# Patient Record
Sex: Female | Born: 1975 | Race: White | Hispanic: Yes | Marital: Single | Smoking: Former smoker
Health system: Southern US, Community
[De-identification: ages and names within clinical notes are randomized; demographics above are authoritative.]

## PROBLEM LIST (undated history)

## (undated) ENCOUNTER — Inpatient Hospital Stay (HOSPITAL_COMMUNITY): Payer: Self-pay

## (undated) DIAGNOSIS — F32A Depression, unspecified: Secondary | ICD-10-CM

## (undated) DIAGNOSIS — R109 Unspecified abdominal pain: Secondary | ICD-10-CM

## (undated) DIAGNOSIS — D649 Anemia, unspecified: Secondary | ICD-10-CM

## (undated) DIAGNOSIS — F329 Major depressive disorder, single episode, unspecified: Secondary | ICD-10-CM

## (undated) DIAGNOSIS — R161 Splenomegaly, not elsewhere classified: Secondary | ICD-10-CM

## (undated) HISTORY — PX: OTHER SURGICAL HISTORY: SHX169

## (undated) HISTORY — DX: Morbid (severe) obesity due to excess calories: E66.01

## (undated) HISTORY — PX: TUBAL LIGATION: SHX77

## (undated) HISTORY — DX: Anemia, unspecified: D64.9

## (undated) HISTORY — DX: Splenomegaly, not elsewhere classified: R16.1

## (undated) HISTORY — PX: NO PAST SURGERIES: SHX2092

---

## 2004-09-30 ENCOUNTER — Inpatient Hospital Stay (HOSPITAL_COMMUNITY): Admission: AD | Admit: 2004-09-30 | Discharge: 2004-10-03 | Payer: Self-pay | Admitting: *Deleted

## 2004-10-01 ENCOUNTER — Encounter (INDEPENDENT_AMBULATORY_CARE_PROVIDER_SITE_OTHER): Payer: Self-pay | Admitting: *Deleted

## 2004-10-05 ENCOUNTER — Encounter: Admission: RE | Admit: 2004-10-05 | Discharge: 2004-10-08 | Payer: Self-pay | Admitting: Obstetrics

## 2005-03-23 ENCOUNTER — Emergency Department (HOSPITAL_COMMUNITY): Admission: EM | Admit: 2005-03-23 | Discharge: 2005-03-23 | Payer: Self-pay | Admitting: Emergency Medicine

## 2005-03-29 ENCOUNTER — Emergency Department (HOSPITAL_COMMUNITY): Admission: EM | Admit: 2005-03-29 | Discharge: 2005-03-29 | Payer: Self-pay | Admitting: Emergency Medicine

## 2007-08-16 ENCOUNTER — Emergency Department (HOSPITAL_COMMUNITY): Admission: EM | Admit: 2007-08-16 | Discharge: 2007-08-16 | Payer: Self-pay | Admitting: Emergency Medicine

## 2008-07-11 ENCOUNTER — Emergency Department (HOSPITAL_COMMUNITY): Admission: EM | Admit: 2008-07-11 | Discharge: 2008-07-11 | Payer: Self-pay | Admitting: Emergency Medicine

## 2008-07-22 ENCOUNTER — Emergency Department (HOSPITAL_COMMUNITY): Admission: EM | Admit: 2008-07-22 | Discharge: 2008-07-23 | Payer: Self-pay | Admitting: Emergency Medicine

## 2008-09-10 ENCOUNTER — Inpatient Hospital Stay (HOSPITAL_COMMUNITY): Admission: AD | Admit: 2008-09-10 | Discharge: 2008-09-10 | Payer: Self-pay | Admitting: Obstetrics & Gynecology

## 2009-02-08 ENCOUNTER — Inpatient Hospital Stay (HOSPITAL_COMMUNITY): Admission: AD | Admit: 2009-02-08 | Discharge: 2009-02-08 | Payer: Self-pay | Admitting: Obstetrics

## 2009-02-13 ENCOUNTER — Inpatient Hospital Stay (HOSPITAL_COMMUNITY): Admission: AD | Admit: 2009-02-13 | Discharge: 2009-02-15 | Payer: Self-pay | Admitting: Obstetrics

## 2009-09-01 ENCOUNTER — Emergency Department (HOSPITAL_COMMUNITY): Admission: EM | Admit: 2009-09-01 | Discharge: 2009-09-01 | Payer: Self-pay | Admitting: Emergency Medicine

## 2009-11-12 ENCOUNTER — Ambulatory Visit: Payer: Self-pay | Admitting: Family Medicine

## 2010-03-01 ENCOUNTER — Emergency Department (HOSPITAL_COMMUNITY)
Admission: EM | Admit: 2010-03-01 | Discharge: 2010-03-01 | Disposition: A | Discharge status: Home / Self Care | Payer: Self-pay | Attending: Emergency Medicine | Admitting: Emergency Medicine

## 2010-03-01 DIAGNOSIS — R109 Unspecified abdominal pain: Secondary | ICD-10-CM | POA: Insufficient documentation

## 2010-03-01 DIAGNOSIS — R1033 Periumbilical pain: Secondary | ICD-10-CM | POA: Insufficient documentation

## 2010-03-01 LAB — URINE MICROSCOPIC-ADD ON

## 2010-03-01 LAB — URINALYSIS, ROUTINE W REFLEX MICROSCOPIC
Bilirubin Urine: NEGATIVE
Hgb urine dipstick: NEGATIVE
Ketones, ur: NEGATIVE mg/dL
Nitrite: NEGATIVE
Protein, ur: NEGATIVE mg/dL
Specific Gravity, Urine: 1.012 (ref 1.005–1.030)
Urine Glucose, Fasting: NEGATIVE mg/dL
Urobilinogen, UA: 1 mg/dL (ref 0.0–1.0)
pH: 7 (ref 5.0–8.0)

## 2010-03-01 LAB — CBC
Hemoglobin: 11.6 g/dL — ABNORMAL LOW (ref 12.0–15.0)
MCHC: 34.2 g/dL (ref 30.0–36.0)
RBC: 4.03 MIL/uL (ref 3.87–5.11)
WBC: 12.1 10*3/uL — ABNORMAL HIGH (ref 4.0–10.5)

## 2010-03-01 LAB — DIFFERENTIAL
Basophils Absolute: 0.1 10*3/uL (ref 0.0–0.1)
Basophils Relative: 0 % (ref 0–1)
Lymphocytes Relative: 30 % (ref 12–46)
Lymphs Abs: 3.7 10*3/uL (ref 0.7–4.0)
Monocytes Relative: 8 % (ref 3–12)

## 2010-03-01 LAB — POCT PREGNANCY, URINE: Preg Test, Ur: NEGATIVE

## 2010-03-01 LAB — COMPREHENSIVE METABOLIC PANEL
ALT: 14 U/L (ref 0–35)
AST: 17 U/L (ref 0–37)
Alkaline Phosphatase: 77 U/L (ref 39–117)
CO2: 26 mEq/L (ref 19–32)
Calcium: 8.5 mg/dL (ref 8.4–10.5)
GFR calc Af Amer: >60 mL/min (ref >60)
GFR calc non Af Amer: >60 mL/min (ref >60)
Potassium: 3.9 mEq/L (ref 3.5–5.1)
Sodium: 135 mEq/L (ref 135–145)

## 2010-03-03 ENCOUNTER — Emergency Department (HOSPITAL_COMMUNITY)
Admission: EM | Admit: 2010-03-03 | Discharge: 2010-03-03 | Disposition: A | Discharge status: Home / Self Care | Payer: Self-pay | Attending: Emergency Medicine | Admitting: Emergency Medicine

## 2010-03-03 DIAGNOSIS — F1010 Alcohol abuse, uncomplicated: Secondary | ICD-10-CM | POA: Insufficient documentation

## 2010-03-03 DIAGNOSIS — R1110 Vomiting, unspecified: Secondary | ICD-10-CM | POA: Insufficient documentation

## 2010-03-03 LAB — URINALYSIS, ROUTINE W REFLEX MICROSCOPIC
Urine Glucose, Fasting: NEGATIVE mg/dL
pH: 5.5 (ref 5.0–8.0)

## 2010-03-03 LAB — RAPID URINE DRUG SCREEN, HOSP PERFORMED
Barbiturates: NOT DETECTED
Benzodiazepines: NOT DETECTED
Cocaine: NOT DETECTED

## 2010-03-03 LAB — POCT I-STAT, CHEM 8
Chloride: 107 mEq/L (ref 96–112)
Creatinine, Ser: 1.1 mg/dL (ref 0.4–1.2)
Glucose, Bld: 102 mg/dL — ABNORMAL HIGH (ref 70–99)
Hemoglobin: 12.2 g/dL (ref 12.0–15.0)
Potassium: 3.3 mEq/L — ABNORMAL LOW (ref 3.5–5.1)

## 2010-04-13 LAB — CBC
HCT: 36.4 % (ref 36.0–46.0)
Hemoglobin: 10.4 g/dL — ABNORMAL LOW (ref 12.0–15.0)
MCHC: 34.1 g/dL (ref 30.0–36.0)
MCV: 83.2 fL (ref 78.0–100.0)
Platelets: 199 10*3/uL (ref 150–400)
RDW: 14.3 % (ref 11.5–15.5)
RDW: 14.9 % (ref 11.5–15.5)

## 2010-05-03 LAB — CBC
MCHC: 34.5 g/dL (ref 30.0–36.0)
RBC: 3.57 MIL/uL — ABNORMAL LOW (ref 3.87–5.11)
WBC: 13.7 10*3/uL — ABNORMAL HIGH (ref 4.0–10.5)

## 2010-05-03 LAB — GC/CHLAMYDIA PROBE AMP, GENITAL: Chlamydia, DNA Probe: NEGATIVE

## 2010-05-03 LAB — URINALYSIS, ROUTINE W REFLEX MICROSCOPIC
Glucose, UA: NEGATIVE mg/dL
Nitrite: NEGATIVE
Protein, ur: NEGATIVE mg/dL

## 2010-05-03 LAB — WET PREP, GENITAL: Trich, Wet Prep: NONE SEEN

## 2010-05-03 LAB — URINE MICROSCOPIC-ADD ON

## 2010-05-05 LAB — CBC
Hemoglobin: 11.5 g/dL — ABNORMAL LOW (ref 12.0–15.0)
RBC: 3.94 MIL/uL (ref 3.87–5.11)

## 2010-05-05 LAB — LIPASE, BLOOD: Lipase: 19 U/L (ref 11–59)

## 2010-05-05 LAB — COMPREHENSIVE METABOLIC PANEL
ALT: 20 U/L (ref 0–35)
Alkaline Phosphatase: 72 U/L (ref 39–117)
CO2: 22 mEq/L (ref 19–32)
GFR calc non Af Amer: >60 mL/min (ref >60)
Glucose, Bld: 91 mg/dL (ref 70–99)
Potassium: 3.7 mEq/L (ref 3.5–5.1)
Sodium: 138 mEq/L (ref 135–145)
Total Bilirubin: 0.3 mg/dL (ref 0.3–1.2)

## 2010-05-05 LAB — POCT PREGNANCY, URINE
Preg Test, Ur: POSITIVE
Preg Test, Ur: POSITIVE

## 2010-05-05 LAB — URINALYSIS, ROUTINE W REFLEX MICROSCOPIC
Bilirubin Urine: NEGATIVE
Bilirubin Urine: NEGATIVE
Glucose, UA: NEGATIVE mg/dL
Hgb urine dipstick: NEGATIVE
Ketones, ur: NEGATIVE mg/dL
Nitrite: NEGATIVE
Specific Gravity, Urine: 1.02 (ref 1.005–1.030)
Urobilinogen, UA: 1 mg/dL (ref 0.0–1.0)
pH: 6 (ref 5.0–8.0)

## 2010-05-05 LAB — DIFFERENTIAL
Basophils Relative: 1 % (ref 0–1)
Eosinophils Absolute: 0.1 10*3/uL (ref 0.0–0.7)
Monocytes Relative: 7 % (ref 3–12)
Neutrophils Relative %: 67 % (ref 43–77)

## 2010-05-05 LAB — URINE MICROSCOPIC-ADD ON

## 2010-05-05 LAB — HCG, QUANTITATIVE, PREGNANCY: hCG, Beta Chain, Quant, S: 88223 m[IU]/mL — ABNORMAL HIGH (ref <5)

## 2011-05-01 ENCOUNTER — Emergency Department (HOSPITAL_COMMUNITY)
Admission: EM | Admit: 2011-05-01 | Discharge: 2011-05-01 | Disposition: A | Discharge status: Home / Self Care | Payer: Self-pay | Attending: Emergency Medicine | Admitting: Emergency Medicine

## 2011-05-01 ENCOUNTER — Encounter (HOSPITAL_COMMUNITY): Payer: Self-pay | Admitting: Emergency Medicine

## 2011-05-01 ENCOUNTER — Other Ambulatory Visit: Payer: Self-pay

## 2011-05-01 DIAGNOSIS — R0789 Other chest pain: Secondary | ICD-10-CM | POA: Insufficient documentation

## 2011-05-01 DIAGNOSIS — R10811 Right upper quadrant abdominal tenderness: Secondary | ICD-10-CM | POA: Insufficient documentation

## 2011-05-01 DIAGNOSIS — R110 Nausea: Secondary | ICD-10-CM | POA: Insufficient documentation

## 2011-05-01 DIAGNOSIS — R109 Unspecified abdominal pain: Secondary | ICD-10-CM | POA: Insufficient documentation

## 2011-05-01 DIAGNOSIS — N390 Urinary tract infection, site not specified: Secondary | ICD-10-CM | POA: Insufficient documentation

## 2011-05-01 LAB — CBC
HCT: 35.6 % — ABNORMAL LOW (ref 36.0–46.0)
MCH: 27.6 pg (ref 26.0–34.0)
MCHC: 34 g/dL (ref 30.0–36.0)
MCV: 81.3 fL (ref 78.0–100.0)
RDW: 12.5 % (ref 11.5–15.5)

## 2011-05-01 LAB — DIFFERENTIAL
Basophils Absolute: 0.1 10*3/uL (ref 0.0–0.1)
Basophils Relative: 1 % (ref 0–1)
Eosinophils Absolute: 0.3 10*3/uL (ref 0.0–0.7)
Eosinophils Relative: 2 % (ref 0–5)
Monocytes Absolute: 0.7 10*3/uL (ref 0.1–1.0)

## 2011-05-01 LAB — URINALYSIS, ROUTINE W REFLEX MICROSCOPIC
Bilirubin Urine: NEGATIVE
Ketones, ur: NEGATIVE mg/dL
Nitrite: NEGATIVE
Protein, ur: NEGATIVE mg/dL
Specific Gravity, Urine: 1.012 (ref 1.005–1.030)
Urobilinogen, UA: 0.2 mg/dL (ref 0.0–1.0)

## 2011-05-01 LAB — COMPREHENSIVE METABOLIC PANEL
AST: 20 U/L (ref 0–37)
Albumin: 3.9 g/dL (ref 3.5–5.2)
BUN: 14 mg/dL (ref 6–23)
Calcium: 9 mg/dL (ref 8.4–10.5)
Creatinine, Ser: 0.59 mg/dL (ref 0.50–1.10)

## 2011-05-01 LAB — URINE MICROSCOPIC-ADD ON

## 2011-05-01 LAB — D-DIMER, QUANTITATIVE: D-Dimer, Quant: 0.36 ug/mL-FEU (ref 0.00–0.48)

## 2011-05-01 LAB — LIPASE, BLOOD: Lipase: 24 U/L (ref 11–59)

## 2011-05-01 MED ORDER — GI COCKTAIL ~~LOC~~
30.0000 mL | Freq: Once | ORAL | Status: AC
  Administered 2011-05-01: 30 mL via ORAL
  Filled 2011-05-01: qty 30

## 2011-05-01 MED ORDER — SULFAMETHOXAZOLE-TRIMETHOPRIM 800-160 MG PO TABS: 1.0000 | ORAL_TABLET | Freq: Two times a day (BID) | ORAL | Status: AC

## 2011-05-01 NOTE — ED Notes (Addendum)
To ed for eval of left flank pain, freq urination, and chest discomfort for the past week. Denies coughing.   M.Orr RN

## 2011-05-01 NOTE — Discharge Instructions (Signed)
Infeccin del tracto urinario (Urinary Tract Infection) Las infecciones en el tracto urinario pueden comenzar en varios lugares. Una infeccin en la vejiga (cistitis), una infeccin en el rin (pielonefritis) o una infeccin en la prstata (prostatitis) son diferentes tipos de infeccin del tracto urinario. Por lo general mejoran si se los trata con antibiticos. Los antibiticos son medicamentos que matan grmenes. [employer] medicamentos que le han recetado hasta que se terminen. Podr sentirse bien dentro de [address], pero DEBE TOMAR LOS MEDICAMENTOS HASTA TERMINAR EL TRATAMIENTO, de lo contrario la infeccin puede no solucionarse y luego ser ms difcil de Warehouse manager. INSTRUCCIONES PARA EL CUIDADO DOMICILIARIO  Beba gran cantidad de lquidos para mantener la orina de tono claro o color amarillo plido. Se recomienda especialmente el jugo de arndanos rojos, adems de grandes cantidades de [place].   Evite la cafena, el t y las bebidas con gas. Estas sustancias irritan la vejiga.   El alcohol puede Teacher, adult education.   Utilice los medicamentos de venta libre o de prescripcin para Chief Technology Officer, Environmental health practitioner o la [place], segn se lo indique el profesional que lo asiste.  PARA PREVENIR FUTURAS INFECCIONES:  Vace la vejiga con frecuencia. Evite retener la orina durante largos perodos.   Despus de mover el intestino, las mujeres deben higienizarse la regin perineal desde adelante hacia atrs. Use cada papel tissue slo una vez.   Vace la vejiga antes y despus de Management consultant.  OBTENER LOS RESULTADOS DE LAS PRUEBAS Durante su visita no contar con todos los [employer]. En este caso, tenga otra entrevista con su mdico para conocerlos. No piense que el resultado es normal si no tiene noticias de su mdico o de la institucin mdica. Es Copy seguimiento de todos los [place].  SOLICITE ATENCIN MDICA SI:  Siente dolor en la espalda.    El beb tiene ms de 3 meses y su temperatura rectal es de 100.5 F (38.1 C) o ms durante ms de 1 da.   Los problemas (sntomas) no mejoran en 3 das. Solicite atencin mdica antes si empeora.  SOLICITE ATENCIN MDICA DE INMEDIATO SI:  Comienza a sentir un dolor de espaldas o en la zona abdominal inferior intenso.   Comienza a sentir escalofros.   Tiene fiebre.   Su beb tiene ms de 3 meses y su temperatura rectal es de 102 F (38.9 C) o mayor.   Su beb tiene 3 meses o menos y su temperatura rectal es de 100.4 F (38 C) o mayor.   Siente nuseas o vmitos.   Tiene una sensacin continua de quemazn o molestias al [employer].  EST SEGURO QUE:  Comprende las instrucciones para el alta mdica.   Controlar su enfermedad.   Solicitar atencin mdica de inmediato segn las indicaciones.  Document Released: 10/22/2004 Document Revised: 01/01/2011 ExitCare Patient Information 2012 ExitCare, LLCDolor en el pecho (inespecfico) (Chest Pain, Nonspecific) Con frecuencia es difcil hallar la causa del dolor en el pecho. El dolor puede estar relacionado con algo grave como un ataque cardaco o un cogulo en los pulmones. Tambin podra estar causado por algo menos grave, como una contusin en el pecho o un virus. Contrlese con su mdico para obtener una evaluacin ms exhaustiva. Podrn solicitarle nuevas pruebas de laboratorio u otros estudios para Veterinary surgeon causa del dolor. La mayora de las veces, un dolor en el pecho inespecfico mejorar en 2  3 das con reposo y Building surveyor. ATENCIN EN EL HOGAR  Para los hematomas en el pecho, aplique hielo sobre el rea dolorida durante 15 a 20 minutos 3 a 4 veces por da. Realice esto slo si usted o su hijo se sienten mejor.   Coloque el hielo en una bolsa plstica.   Coloque una toalla entre la piel y Copy.   Descanse durante los prximos 2  3 das.   Si el dolor mejora, puede volver a Printmaker.   Consulte con un  mdico si el dolor se prolonga ms de 1  2 semanas.   Tome slo la medicacin que le indic el mdico.   Si fuma, deje de [place].  SOLICITE AYUDA DE INMEDIATO SI:  Aumenta el dolor o pasa hacia la espalda, brazos, cuello, vientre (abdomen) o mandbula.   Usted o su hijo presentan dificultad para respirar.   Usted o el nio tosen ms que lo normal o eliminan sangre al toser.   Usted o el nio sienten un fuerte dolor de espalda o vientre, nuseas o vomitan.   Usted o su hijo se sienten dbiles.   Usted o su hijo se desmayan.   Usted o su nio tienen una temperatura oral de ms de 102 F (38.9 C) y no puede controlarla con medicamentos.  Alguno de [employer] pueden ser graves y puede ser Radio broadcast assistant. No espere a ver que los problemas desaparezcan. Solicite atencin mdica de inmediato. Comunquese inmediatamente con el servicio de urgencias de su localidad (911 en los [place]). No conduzca por sus propios medios hasta el hospital. ASEGURESE QUE:   Comprende estas instrucciones.   Controlar su enfermedad.   Solicitar ayuda inmediatamente si no mejora o si empeora.  Document Released: 04/10/2008 Document Revised: 01/01/2011 [facility] Patient Information 2012 [place], [place].Marland Kitchen

## 2011-05-01 NOTE — ED Provider Notes (Signed)
History     CSN: [id number]  Arrival date & time 05/01/11  1044   First MD Initiated Contact with Patient 05/01/11 1131      Chief Complaint  Patient presents with  . Urinary Frequency  . Chest Pain   History stay obtained through language interpreter the (Consider location/radiation/quality/duration/timing/severity/associated sxs/prior treatment) HPI  Patient states that she has had some anterior sharp chest pain for weeks that comes and goes. It is sharp and pressure-like. She feels like she has difficulty breathing when the episode occurs. She does have some associated nausea and it increases with food intake. She denies any cough, fever, or chills. She has had this on occasion in the past but feels like it has increased in the past week. She also has some left flank pain in the left lower back to the left side. This is associated with chest discomfort. She has some increased frequency of urination. She is not currently using birth control and does not think she is pregnant and has had normal menses. She does not smoke and has no family history of coronary artery disease. She denies any swelling in her legs or any history of DVT or blood clots.  History reviewed. No pertinent past medical history.  History reviewed. No pertinent past surgical history.  History reviewed. No pertinent family history.  History  Substance Use Topics  . Smoking status: Not on file  . Smokeless tobacco: Not on file  . Alcohol Use: Not on file    OB History    Grav Para Term Preterm Abortions TAB SAB Ect Mult Living                  Review of Systems  All other systems reviewed and are negative.    Allergies  Review of patient's allergies indicates no known allergies.  Home Medications  No current outpatient prescriptions on file.  BP 121/64  Pulse 51  Temp(Src) 98.2 F (36.8 C) (Oral)  Resp 16  SpO2 99%  Physical Exam  Nursing note and vitals reviewed. Constitutional: She is  oriented to person, place, and time. She appears well-developed.  Eyes: Pupils are equal, round, and reactive to light.  Neck: Normal range of motion. Neck supple.  Cardiovascular: Normal rate, regular rhythm and normal heart sounds.   Pulmonary/Chest: Effort normal and breath sounds normal.  Abdominal: Soft. There is tenderness.       Mild right upper quadrant tenderness.  Musculoskeletal: Normal range of motion.  Neurological: She is alert and oriented to person, place, and time. She has normal reflexes.  Skin: Skin is warm and dry.  Psychiatric: She has a normal mood and affect.    ED Course  Procedures (including critical care time)   Labs Reviewed  URINALYSIS, ROUTINE W REFLEX MICROSCOPIC  PREGNANCY, URINE  D-DIMER, QUANTITATIVE  CBC  DIFFERENTIAL  COMPREHENSIVE METABOLIC PANEL  LIPASE, BLOOD   No results found.   No diagnosis found.    MDM  Differential diagnosis, pulmonary embolism, biliary colic, less likely is coronary artery disease. Patient is normal EKG. She will have a d-dimer and CBC and liver function tests and lipase ordered. She is not actively vomiting here. She's given a GI cocktail. Patient with atypical chest pain. She is not tachycardic. She has an EKG was normal sinus rhythm. She has no evidence of PE with a normal d-dimer. Urine shows some white blood cells. She'll be treated for urinary tract infection.  Date: 05/01/2011  Rate: 61  Rhythm:  normal sinus rhythm  QRS Axis: normal  Intervals: normal  ST/T Wave abnormalities: normal  Conduction Disutrbances: none  Narrative Interpretation: unremarkable         Hilario Quarry, MD 05/01/11 1353

## 2012-01-27 NOTE — L&D Delivery Note (Signed)
Delivery Note At  a viable unspecified sex was delivered via  (Presentation: ;  ).  APGAR: , ; weight .   Placenta status: , .  Cord:  with the following complications: .  Cord pH: not done  Anesthesia: Epidural  Episiotomy:  Lacerations:  Suture Repair: 2.0 Est. Blood Loss (mL):   Mom to postpartum.  Baby to nursery-stable.  MARSHALL,BERNARD A 10/31/2012, 7:24 PM

## 2012-03-07 ENCOUNTER — Encounter (HOSPITAL_COMMUNITY): Payer: Self-pay | Admitting: Emergency Medicine

## 2012-03-07 ENCOUNTER — Emergency Department (HOSPITAL_COMMUNITY): Payer: Self-pay

## 2012-03-07 ENCOUNTER — Emergency Department (HOSPITAL_COMMUNITY)
Admission: EM | Admit: 2012-03-07 | Discharge: 2012-03-08 | Disposition: A | Discharge status: Home / Self Care | Payer: Self-pay | Attending: Emergency Medicine | Admitting: Emergency Medicine

## 2012-03-07 DIAGNOSIS — R109 Unspecified abdominal pain: Secondary | ICD-10-CM | POA: Insufficient documentation

## 2012-03-07 DIAGNOSIS — N730 Acute parametritis and pelvic cellulitis: Secondary | ICD-10-CM

## 2012-03-07 DIAGNOSIS — Z3490 Encounter for supervision of normal pregnancy, unspecified, unspecified trimester: Secondary | ICD-10-CM

## 2012-03-07 DIAGNOSIS — O9989 Other specified diseases and conditions complicating pregnancy, childbirth and the puerperium: Secondary | ICD-10-CM | POA: Insufficient documentation

## 2012-03-07 DIAGNOSIS — O2390 Unspecified genitourinary tract infection in pregnancy, unspecified trimester: Secondary | ICD-10-CM | POA: Insufficient documentation

## 2012-03-07 LAB — URINALYSIS, ROUTINE W REFLEX MICROSCOPIC
Bilirubin Urine: NEGATIVE
Hgb urine dipstick: NEGATIVE
Nitrite: NEGATIVE
Protein, ur: NEGATIVE mg/dL
Urobilinogen, UA: 1 mg/dL (ref 0.0–1.0)

## 2012-03-07 LAB — BASIC METABOLIC PANEL
BUN: 10 mg/dL (ref 6–23)
CO2: 24 mEq/L (ref 19–32)
Calcium: 9 mg/dL (ref 8.4–10.5)
Chloride: 103 mEq/L (ref 96–112)
Creatinine, Ser: 0.54 mg/dL (ref 0.50–1.10)
GFR calc Af Amer: >90 mL/min (ref >90)
GFR calc non Af Amer: >90 mL/min (ref >90)
Glucose, Bld: 111 mg/dL — ABNORMAL HIGH (ref 70–99)
Potassium: 3.3 mEq/L — ABNORMAL LOW (ref 3.5–5.1)
Sodium: 137 mEq/L (ref 135–145)

## 2012-03-07 LAB — URINE MICROSCOPIC-ADD ON

## 2012-03-07 LAB — COMPREHENSIVE METABOLIC PANEL
ALT: 16 U/L (ref 0–35)
AST: 16 U/L (ref 0–37)
Albumin: 3.3 g/dL — ABNORMAL LOW (ref 3.5–5.2)
Alkaline Phosphatase: 77 U/L (ref 39–117)
BUN: 8 mg/dL (ref 6–23)
CO2: 25 mEq/L (ref 19–32)
Calcium: 8.9 mg/dL (ref 8.4–10.5)
Chloride: 105 mEq/L (ref 96–112)
Creatinine, Ser: 0.57 mg/dL (ref 0.50–1.10)
GFR calc Af Amer: >90 mL/min (ref >90)
GFR calc non Af Amer: >90 mL/min (ref >90)
Glucose, Bld: 103 mg/dL — ABNORMAL HIGH (ref 70–99)
Potassium: 3.2 mEq/L — ABNORMAL LOW (ref 3.5–5.1)
Sodium: 139 mEq/L (ref 135–145)
Total Bilirubin: 0.1 mg/dL — ABNORMAL LOW (ref 0.3–1.2)
Total Protein: 6.8 g/dL (ref 6.0–8.3)

## 2012-03-07 LAB — CBC WITH DIFFERENTIAL/PLATELET
Basophils Absolute: 0.1 10*3/uL (ref 0.0–0.1)
Eosinophils Relative: 2 % (ref 0–5)
HCT: 32.9 % — ABNORMAL LOW (ref 36.0–46.0)
Hemoglobin: 11.4 g/dL — ABNORMAL LOW (ref 12.0–15.0)
Lymphocytes Relative: 24 % (ref 12–46)
Lymphs Abs: 3.6 10*3/uL (ref 0.7–4.0)
MCV: 80.2 fL (ref 78.0–100.0)
Monocytes Absolute: 0.9 10*3/uL (ref 0.1–1.0)
Monocytes Relative: 6 % (ref 3–12)
Neutro Abs: 10 10*3/uL — ABNORMAL HIGH (ref 1.7–7.7)
RBC: 4.1 MIL/uL (ref 3.87–5.11)
WBC: 14.8 10*3/uL — ABNORMAL HIGH (ref 4.0–10.5)

## 2012-03-07 LAB — CBC
Platelets: 284 10*3/uL (ref 150–400)
RBC: 4.16 MIL/uL (ref 3.87–5.11)
WBC: 16.2 10*3/uL — ABNORMAL HIGH (ref 4.0–10.5)

## 2012-03-07 LAB — LIPASE, BLOOD: Lipase: 18 U/L (ref 11–59)

## 2012-03-07 LAB — HCG, QUANTITATIVE, PREGNANCY: hCG, Beta Chain, Quant, S: 2916 m[IU]/mL — ABNORMAL HIGH (ref <5)

## 2012-03-07 MED ORDER — POTASSIUM CHLORIDE CRYS ER 20 MEQ PO TBCR
40.0000 meq | EXTENDED_RELEASE_TABLET | Freq: Once | ORAL | Status: AC
  Administered 2012-03-08: 40 meq via ORAL
  Filled 2012-03-07: qty 2

## 2012-03-07 NOTE — ED Notes (Signed)
Patient complaining of lower abdominal pain that started two to three weeks ago.  Reports foul odor to urine; denies other urinary changes.  Denies burning during urination.  LMP sometime in December or January; patient is unsure of month.  Reports positive pregnancy test at home.  Reports nausea; denies vomiting and diarrhea.

## 2012-03-07 NOTE — ED Provider Notes (Signed)
History     CSN: [id number]  Arrival date & time 03/07/12  2033   First MD Initiated Contact with Patient 03/07/12 2233      Chief Complaint  Patient presents with  . Abdominal Pain    (Consider location/radiation/quality/duration/timing/severity/associated sxs/prior treatment) Patient is a 37 y.o. female presenting with abdominal pain. The history is provided by the patient and a relative. The history is limited by a language barrier. A language interpreter was used.  Abdominal Pain Pain location:  Suprapubic and LLQ Pain quality: aching   Pain quality: not bloating   Pain radiates to:  Does not radiate Pain severity:  Mild Onset quality:  Gradual Duration:  2 weeks Progression:  Worsening Relieved by:  Nothing Associated symptoms: no dysuria, no fever, no hematuria, no nausea, no vaginal bleeding, no vaginal discharge and no vomiting      38 year old Hispanic female coming in with suprapubic and left lower quadrant pain x2 weeks that has worsened in the last 24 hours. She took a pregnancy test at her home but is not sure if it was positive. Her last period was the end of December or beginning of January she cannot remember. She has appointments with an OB doctor next week on [facility] but does not know the name. She has one partner. States that when  they have intercourse there is a foul odor. She denies fever hematuria dysuria, vaginal discharge, vaginal bleeding or vomiting. Otherwise healthy adult obese.   History reviewed. No pertinent past medical history.  History reviewed. No pertinent past surgical history.  History reviewed. No pertinent family history.  History  Substance Use Topics  . Smoking status: Never Smoker   . Smokeless tobacco: Not on file  . Alcohol Use: No    OB History   Grav Para Term Preterm Abortions TAB SAB Ect Mult Living                  Review of Systems  Constitutional: Negative.  Negative for fever.  HENT: Negative.   Eyes:  Negative.   Respiratory: Negative.   Cardiovascular: Negative.   Gastrointestinal: Positive for abdominal pain. Negative for nausea, vomiting, blood in stool and abdominal distention.       Suprapubic and left lower quadrant  Genitourinary: Negative for dysuria, urgency, frequency, hematuria, flank pain, vaginal bleeding, vaginal discharge and pelvic pain.  Neurological: Negative.   Psychiatric/Behavioral: Negative.   All other systems reviewed and are negative.    Allergies  Review of patient's allergies indicates no known allergies.  Home Medications  No current outpatient prescriptions on file.  BP 119/64  Pulse 84  Temp(Src) 97.8 F (36.6 C) (Oral)  Resp 20  SpO2 99%  Physical Exam  Nursing note and vitals reviewed. Constitutional: She is oriented to person, place, and time. She appears well-developed and well-nourished.  HENT:  Head: Normocephalic and atraumatic.  Eyes: Conjunctivae and EOM are normal. Pupils are equal, round, and reactive to light.  Neck: Normal range of motion. Neck supple.  Cardiovascular: Normal rate.   Pulmonary/Chest: Effort normal.  Abdominal: Soft.  Genitourinary: Vaginal discharge found.  Musculoskeletal: Normal range of motion. She exhibits no edema and no tenderness.  Neurological: She is alert and oriented to person, place, and time. She has normal reflexes.  Skin: Skin is warm and dry.  Psychiatric: She has a normal mood and affect.    ED Course  Pelvic exam Date/Time: 03/07/2012 11:24 AM Performed by: Remi Haggard Authorized by: Remi Haggard Consent:  Verbal consent obtained. Risks and benefits: risks, benefits and alternatives were discussed Consent given by: patient Patient understanding: patient states understanding of the procedure being performed Patient identity confirmed: verbally with patient, arm band, provided demographic data and hospital-assigned identification number Preparation: Patient was prepped and draped in  the usual sterile fashion. Patient sedated: no Patient tolerance: Patient tolerated the procedure well with no immediate complications. Comments: Greenish milky vaginal discharge with odor.   (including critical care time)  Labs Reviewed  CBC WITH DIFFERENTIAL - Abnormal; Notable for the following:    WBC 14.8 (*)    Hemoglobin 11.4 (*)    HCT 32.9 (*)    Neutro Abs 10.0 (*)    All other components within normal limits  COMPREHENSIVE METABOLIC PANEL - Abnormal; Notable for the following:    Potassium 3.2 (*)    Glucose, Bld 103 (*)    Albumin 3.3 (*)    Total Bilirubin 0.1 (*)    All other components within normal limits  URINALYSIS, ROUTINE W REFLEX MICROSCOPIC - Abnormal; Notable for the following:    Leukocytes, UA SMALL (*)    All other components within normal limits  URINE MICROSCOPIC-ADD ON - Abnormal; Notable for the following:    Squamous Epithelial / LPF MANY (*)    All other components within normal limits  HCG, QUANTITATIVE, PREGNANCY - Abnormal; Notable for the following:    hCG, Beta Chain, Quant, S 2916 (*)    All other components within normal limits  CBC - Abnormal; Notable for the following:    WBC 16.2 (*)    Hemoglobin 11.5 (*)    HCT 33.4 (*)    All other components within normal limits  BASIC METABOLIC PANEL - Abnormal; Notable for the following:    Potassium 3.3 (*)    Glucose, Bld 111 (*)    All other components within normal limits  POCT PREGNANCY, URINE - Abnormal; Notable for the following:    Preg Test, Ur POSITIVE (*)    All other components within normal limits  URINE CULTURE  GC/CHLAMYDIA PROBE AMP  WET PREP, GENITAL  LIPASE, BLOOD   No results found.   No diagnosis found.    MDM  37yo female with abdominal pain and [redacted] weeks pregnant.  + PID.  She was given rocephen and azithromycin in the ER.  Follow up next week with OB scheduled appointment and will be rechecked.  [facility] be [facility]. No  intercourse until rechecked. No fever or vomiting.  WBC 16.2. K 3.2.  Received KCL  in the ER. U/s shows IUP.    Labs Reviewed  WET PREP, GENITAL - Abnormal; Notable for the following:    Clue Cells Wet Prep HPF POC FEW (*)    WBC, Wet Prep HPF POC MANY (*)    All other components within normal limits  CBC WITH DIFFERENTIAL - Abnormal; Notable for the following:    WBC 14.8 (*)    Hemoglobin 11.4 (*)    HCT 32.9 (*)    Neutro Abs 10.0 (*)    All other components within normal limits  COMPREHENSIVE METABOLIC PANEL - Abnormal; Notable for the following:    Potassium 3.2 (*)    Glucose, Bld 103 (*)    Albumin 3.3 (*)    Total Bilirubin 0.1 (*)    All other components within normal limits  URINALYSIS, ROUTINE W REFLEX MICROSCOPIC - Abnormal; Notable for the following:  Leukocytes, UA SMALL (*)    All other components within normal limits  URINE MICROSCOPIC-ADD ON - Abnormal; Notable for the following:    Squamous Epithelial / LPF MANY (*)    All other components within normal limits  HCG, QUANTITATIVE, PREGNANCY - Abnormal; Notable for the following:    hCG, Beta Chain, Quant, S 2916 (*)    All other components within normal limits  CBC - Abnormal; Notable for the following:    WBC 16.2 (*)    Hemoglobin 11.5 (*)    HCT 33.4 (*)    All other components within normal limits  BASIC METABOLIC PANEL - Abnormal; Notable for the following:    Potassium 3.3 (*)    Glucose, Bld 111 (*)    All other components within normal limits  POCT PREGNANCY, URINE - Abnormal; Notable for the following:    Preg Test, Ur POSITIVE (*)    All other components within normal limits  URINE CULTURE  GC/CHLAMYDIA PROBE AMP  LIPASE, BLOOD          Remi Haggard, NP 03/08/12 [phone]

## 2012-03-08 LAB — WET PREP, GENITAL

## 2012-03-08 MED ORDER — LIDOCAINE HCL (PF) 1 % IJ SOLN
INTRAMUSCULAR | Status: AC
  Filled 2012-03-08: qty 5

## 2012-03-08 MED ORDER — CEFTRIAXONE SODIUM 250 MG IJ SOLR
250.0000 mg | Freq: Once | INTRAMUSCULAR | Status: AC
  Administered 2012-03-08: 250 mg via INTRAMUSCULAR
  Filled 2012-03-08: qty 250

## 2012-03-08 MED ORDER — AZITHROMYCIN 250 MG PO TABS
1000.0000 mg | ORAL_TABLET | Freq: Once | ORAL | Status: AC
  Administered 2012-03-08: 1000 mg via ORAL
  Filled 2012-03-08: qty 4

## 2012-03-08 NOTE — ED Notes (Signed)
Rx x 2.  Pt voiced understanding to f/u with obstetrician and return for worsening condition.

## 2012-03-08 NOTE — ED Provider Notes (Signed)
Medical screening examination/treatment/procedure(s) were performed by non-physician practitioner and as supervising physician I was immediately available for consultation/collaboration.  Kesia Dalto Lytle Michaels, MD 03/08/12 [phone]

## 2012-03-09 LAB — URINE CULTURE: Colony Count: 25000

## 2012-03-29 LAB — OB RESULTS CONSOLE ANTIBODY SCREEN: Antibody Screen: NEGATIVE

## 2012-03-29 LAB — OB RESULTS CONSOLE HIV ANTIBODY (ROUTINE TESTING): HIV: NONREACTIVE

## 2012-03-29 LAB — OB RESULTS CONSOLE RPR: RPR: NONREACTIVE

## 2012-03-29 LAB — OB RESULTS CONSOLE ABO/RH

## 2012-05-30 ENCOUNTER — Inpatient Hospital Stay (HOSPITAL_COMMUNITY)
Admission: AD | Admit: 2012-05-30 | Discharge: 2012-05-31 | Disposition: A | Discharge status: Home / Self Care | Payer: Self-pay | Source: Ambulatory Visit | Attending: Obstetrics | Admitting: Obstetrics

## 2012-05-30 ENCOUNTER — Encounter (HOSPITAL_COMMUNITY): Payer: Self-pay | Admitting: *Deleted

## 2012-05-30 DIAGNOSIS — O99891 Other specified diseases and conditions complicating pregnancy: Secondary | ICD-10-CM | POA: Insufficient documentation

## 2012-05-30 DIAGNOSIS — O26899 Other specified pregnancy related conditions, unspecified trimester: Secondary | ICD-10-CM

## 2012-05-30 DIAGNOSIS — R109 Unspecified abdominal pain: Secondary | ICD-10-CM | POA: Insufficient documentation

## 2012-05-30 DIAGNOSIS — M549 Dorsalgia, unspecified: Secondary | ICD-10-CM | POA: Insufficient documentation

## 2012-05-30 DIAGNOSIS — M79609 Pain in unspecified limb: Secondary | ICD-10-CM | POA: Insufficient documentation

## 2012-05-30 MED ORDER — CYCLOBENZAPRINE HCL 10 MG PO TABS
10.0000 mg | ORAL_TABLET | Freq: Once | ORAL | Status: AC
  Administered 2012-05-31: 10 mg via ORAL
  Filled 2012-05-30: qty 1

## 2012-05-30 NOTE — MAU Note (Signed)
Pt G5 P4 at 17.5wks having lower abd pain x 3 days, mid to lower back pain x 1 wk, inflammation around umbilicus x 2 wks.

## 2012-05-31 DIAGNOSIS — N949 Unspecified condition associated with female genital organs and menstrual cycle: Secondary | ICD-10-CM

## 2012-05-31 DIAGNOSIS — O9989 Other specified diseases and conditions complicating pregnancy, childbirth and the puerperium: Secondary | ICD-10-CM

## 2012-05-31 LAB — URINALYSIS, ROUTINE W REFLEX MICROSCOPIC
Glucose, UA: NEGATIVE mg/dL
Protein, ur: NEGATIVE mg/dL
Specific Gravity, Urine: 1.01 (ref 1.005–1.030)
pH: 6 (ref 5.0–8.0)

## 2012-05-31 LAB — URINE MICROSCOPIC-ADD ON

## 2012-05-31 NOTE — MAU Provider Note (Signed)
History     CSN: [id number]  Arrival date and time: 05/30/12 2321   None     Chief Complaint  Patient presents with  . Abdominal Pain   HPI  Katherine [name] is a 37 y.o. [id number] at [redacted]w[redacted]d who presents today with suprapubic pain, back pain and leg pain. She rates the pain 7/10. She denies any UCs. VB or LOF, and states she has felt the baby move. She states that about a month ago she was treated for a UTI.   Past Medical History  Diagnosis Date  . Medical history non-contributory     Past Surgical History  Procedure Laterality Date  . No past surgeries      History reviewed. No pertinent family history.  History  Substance Use Topics  . Smoking status: Never Smoker   . Smokeless tobacco: Not on file  . Alcohol Use: No    Allergies: No Known Allergies  Prescriptions prior to admission  Medication Sig Dispense Refill  . ciprofloxacin (CIPRO) 500 MG tablet Take 500 mg by mouth 2 (two) times daily.      . Prenatal Vit-Fe Fumarate-FA (MULTIVITAMIN-PRENATAL) 27-0.8 MG TABS Take 1 tablet by mouth daily at 12 noon.        Review of Systems  Constitutional: Negative for fever.  Eyes: Negative for blurred vision.  Gastrointestinal: Negative for nausea, vomiting, diarrhea and constipation.  Genitourinary: Negative for dysuria, urgency and frequency.  Neurological: Negative for dizziness and headaches.   Physical Exam   Blood pressure 111/70, pulse 84, temperature 98.4 F (36.9 C), resp. rate 18, height 5\' 1"  (1.549 m), weight 98.249 kg (216 lb 9.6 oz).  Physical Exam  Nursing note and vitals reviewed. Constitutional: She is oriented to person, place, and time. She appears well-developed. No distress.  Cardiovascular: Normal rate.   Respiratory: Effort normal.  GI: Soft. There is tenderness.  Genitourinary:  No CVA tenderness  Neurological: She is alert and oriented to person, place, and time.  Skin: Skin is warm.  Psychiatric: She has a normal mood and  affect.    MAU Course  Procedures  Results for orders placed during the hospital encounter of 05/30/12 (from the past 24 hour(s))  URINALYSIS, ROUTINE W REFLEX MICROSCOPIC     Status: Abnormal   Collection Time    05/30/12 11:38 PM      Result Value Range   Color, Urine YELLOW  YELLOW   APPearance CLEAR  CLEAR   Specific Gravity, Urine 1.010  1.005 - 1.030   pH 6.0  5.0 - 8.0   Glucose, UA NEGATIVE  NEGATIVE mg/dL   Hgb urine dipstick NEGATIVE  NEGATIVE   Bilirubin Urine NEGATIVE  NEGATIVE   Ketones, ur NEGATIVE  NEGATIVE mg/dL   Protein, ur NEGATIVE  NEGATIVE mg/dL   Urobilinogen, UA 0.2  0.0 - 1.0 mg/dL   Nitrite NEGATIVE  NEGATIVE   Leukocytes, UA TRACE (*) NEGATIVE  URINE MICROSCOPIC-ADD ON     Status: None   Collection Time    05/30/12 11:38 PM      Result Value Range   Squamous Epithelial / LPF RARE  RARE   WBC, UA 0-2  <3 WBC/hpf   RBC / HPF 0-2  <3 RBC/hpf   Bacteria, UA RARE  RARE    0055: Pt is feeling better after the Flexeril Assessment and Plan   1. Pain of round ligament complicating pregnancy, antepartum    Comfort measures discussed 2nd trimester danger signs reviewed FU with Dr.  Gaynell Face as scheduled.   Tawnya Crook 05/31/2012, 12:06 AM

## 2012-09-06 ENCOUNTER — Ambulatory Visit: Payer: No Typology Code available for payment source | Attending: Family Medicine | Admitting: Internal Medicine

## 2012-09-06 ENCOUNTER — Encounter: Payer: Self-pay | Admitting: Internal Medicine

## 2012-09-06 VITALS — BP 104/64 | HR 68 | Temp 98.9°F | Ht 62.75 in | Wt 214.2 lb

## 2012-09-06 DIAGNOSIS — K029 Dental caries, unspecified: Secondary | ICD-10-CM | POA: Insufficient documentation

## 2012-09-06 NOTE — Progress Notes (Signed)
Patient ID: Katherine [name], female   DOB: 1975-10-21, 37 y.o.   MRN: [id number]  CC: Tooth ache  HPI: Patient is a 37 years old presently at 36 weeks of gestation came in today to establish medical care her for referral dentist. She has had tooth ache for about a week, associated with headache. No other significant medical history. No hypertension, no diabetes. She does not smoke cigarette, she does not drink alcohol.  No Known Allergies Past Medical History  Diagnosis Date  . Medical history non-contributory    Current Outpatient Prescriptions on File Prior to Visit  Medication Sig Dispense Refill  . Prenatal Vit-Fe Fumarate-FA (MULTIVITAMIN-PRENATAL) 27-0.8 MG TABS Take 1 tablet by mouth daily at 12 noon.      . ciprofloxacin (CIPRO) 500 MG tablet Take 500 mg by mouth 2 (two) times daily.       No current facility-administered medications on file prior to visit.   Family History  Problem Relation Age of Onset  . Diabetes Father    History   Social History  . Marital Status: Legally Separated    Spouse Name: N/A    Number of Children: N/A  . Years of Education: N/A   Occupational History  . Not on file.   Social History Main Topics  . Smoking status: Never Smoker   . Smokeless tobacco: Not on file  . Alcohol Use: No  . Drug Use: No  . Sexually Active: Not on file   Other Topics Concern  . Not on file   Social History Narrative  . No narrative on file    Review of Systems: Constitutional: Negative for fever, chills, diaphoresis, activity change, appetite change and fatigue. HENT: Negative for ear pain, nosebleeds, congestion, facial swelling, rhinorrhea, neck pain, neck stiffness and ear discharge.  Eyes: Negative for pain, discharge, redness, itching and visual disturbance. Respiratory: Negative for cough, choking, chest tightness, shortness of breath, wheezing and stridor.  Cardiovascular: Negative for chest pain, palpitations and leg  swelling. Gastrointestinal: Negative for abdominal distention. Genitourinary: Negative for dysuria, urgency, frequency, hematuria, flank pain, decreased urine volume, difficulty urinating and dyspareunia.  Musculoskeletal: Negative for back pain, joint swelling, arthralgias and gait problem. Neurological: Negative for dizziness, tremors, seizures, syncope, facial asymmetry, speech difficulty, weakness, light-headedness, numbness and headaches.  Hematological: Negative for adenopathy. Does not bruise/bleed easily. Psychiatric/Behavioral: Negative for hallucinations, behavioral problems, confusion, dysphoric mood, decreased concentration and agitation.    Objective:   Filed Vitals:   09/06/12 0920  BP: 104/64  Pulse: 68  Temp: 98.9 F (37.2 C)    Physical Exam: Constitutional: Patient appears well-developed and well-nourished. No distress. HENT: Normocephalic, atraumatic, External right and left ear normal. Dental caries in Left molar.  Eyes: Conjunctivae and EOM are normal. PERRLA, no scleral icterus. Neck: Normal ROM. Neck supple. No JVD. No tracheal deviation. No thyromegaly. CVS: RRR, S1/S2 +, no murmurs, no gallops, no carotid bruit.  Pulmonary: Effort and breath sounds normal, no stridor, rhonchi, wheezes, rales.  Abdominal: Gravidly distended.  Musculoskeletal: Normal range of motion. No edema and no tenderness.  Lymphadenopathy: No lymphadenopathy noted, cervical, inguinal or axillary Neuro: Alert. Normal reflexes, muscle tone coordination. No cranial nerve deficit. Skin: Skin is warm and dry. No rash noted. Not diaphoretic. No erythema. No pallor. Psychiatric: Normal mood and affect. Behavior, judgment, thought content normal.  Lab Results  Component Value Date   WBC 16.2* 03/07/2012   HGB 11.5* 03/07/2012   HCT 33.4* 03/07/2012   MCV 80.3 03/07/2012  PLT 284 03/07/2012   Lab Results  Component Value Date   CREATININE 0.54 03/07/2012   BUN 10 03/07/2012   NA 137  03/07/2012   K 3.3* 03/07/2012   CL 103 03/07/2012   CO2 24 03/07/2012    No results found for this basename: HGBA1C   Lipid Panel  No results found for this basename: chol, trig, hdl, cholhdl, vldl, ldlcalc       Assessment and plan:   Patient Active Problem List   Diagnosis Date Noted  . Dental caries 09/06/2012   Referral to dentist for tooth extraction  Florence Yeung was given clear instructions to go to ER or return to the clinic if symptoms don't improve, worsen or new problems develop.  [name] verbalized understanding.  Anyiah Coverdale was told to call to get lab results if hasn't heard anything in the next week.    Interpreter was used to communicate directly with patient for the entire encounter including providing detailed patient instructions.     Jeanann Lewandowsky, MD [facility] [place], [place] [phone]   09/06/2012, 9:38 AM

## 2012-10-05 ENCOUNTER — Inpatient Hospital Stay (HOSPITAL_COMMUNITY): Payer: No Typology Code available for payment source

## 2012-10-05 ENCOUNTER — Encounter (HOSPITAL_COMMUNITY): Payer: Self-pay | Admitting: *Deleted

## 2012-10-05 ENCOUNTER — Inpatient Hospital Stay (HOSPITAL_COMMUNITY)
Admission: AD | Admit: 2012-10-05 | Discharge: 2012-10-05 | Disposition: A | Discharge status: Home / Self Care | Payer: No Typology Code available for payment source | Source: Ambulatory Visit | Attending: Obstetrics | Admitting: Obstetrics

## 2012-10-05 DIAGNOSIS — O99891 Other specified diseases and conditions complicating pregnancy: Secondary | ICD-10-CM | POA: Insufficient documentation

## 2012-10-05 DIAGNOSIS — O368135 Decreased fetal movements, third trimester, fetus 5: Secondary | ICD-10-CM

## 2012-10-05 DIAGNOSIS — O26899 Other specified pregnancy related conditions, unspecified trimester: Secondary | ICD-10-CM

## 2012-10-05 DIAGNOSIS — R109 Unspecified abdominal pain: Secondary | ICD-10-CM | POA: Insufficient documentation

## 2012-10-05 DIAGNOSIS — M549 Dorsalgia, unspecified: Secondary | ICD-10-CM | POA: Insufficient documentation

## 2012-10-05 DIAGNOSIS — O368190 Decreased fetal movements, unspecified trimester, not applicable or unspecified: Secondary | ICD-10-CM | POA: Insufficient documentation

## 2012-10-05 DIAGNOSIS — O9989 Other specified diseases and conditions complicating pregnancy, childbirth and the puerperium: Secondary | ICD-10-CM

## 2012-10-05 LAB — URINALYSIS, ROUTINE W REFLEX MICROSCOPIC
Ketones, ur: NEGATIVE mg/dL
Leukocytes, UA: NEGATIVE
Nitrite: NEGATIVE
Specific Gravity, Urine: 1.01 (ref 1.005–1.030)
pH: 7 (ref 5.0–8.0)

## 2012-10-05 MED ORDER — OXYCODONE-ACETAMINOPHEN 5-325 MG PO TABS
1.0000 | ORAL_TABLET | Freq: Once | ORAL | Status: AC
  Administered 2012-10-05: 1 via ORAL
  Filled 2012-10-05: qty 1

## 2012-10-05 NOTE — Progress Notes (Signed)
Jannifer Rodney, np notified of patient, tracing (? Variables). She reviewed tracing. Order received for BPP/ AFI ultrasound.

## 2012-10-05 NOTE — MAU Provider Note (Signed)
History     CSN: [id number]  Arrival date and time: 10/05/12 0941   None     Chief Complaint  Patient presents with  . Abdominal Pain  . Decreased Fetal Movement   HPI Comments: Katherine [name] 37 y.o. [id number] presents to MAU for back, abdominal pains and decreased fetal movement since last night. She denies any vaginal bleeding or leaking of fluids.      Abdominal Pain      Past Medical History  Diagnosis Date  . Medical history non-contributory     Past Surgical History  Procedure Laterality Date  . No past surgeries      Family History  Problem Relation Age of Onset  . Diabetes Father     History  Substance Use Topics  . Smoking status: Never Smoker   . Smokeless tobacco: Not on file  . Alcohol Use: No    Allergies: No Known Allergies  Prescriptions prior to admission  Medication Sig Dispense Refill  . ciprofloxacin (CIPRO) 500 MG tablet Take 500 mg by mouth 2 (two) times daily.      . Prenatal Vit-Fe Fumarate-FA (MULTIVITAMIN-PRENATAL) 27-0.8 MG TABS Take 1 tablet by mouth daily at 12 noon.        Review of Systems  Constitutional: Negative.   HENT: Negative.   Eyes: Negative.   Respiratory: Negative.   Cardiovascular: Negative.   Gastrointestinal: Positive for abdominal pain.  Genitourinary: Negative.   Musculoskeletal: Positive for back pain.  Skin: Negative.   Neurological: Negative.   Psychiatric/Behavioral: Negative.    Physical Exam   Blood pressure 132/56, pulse 70, temperature 97.8 F (36.6 C), temperature source Oral, resp. rate 18, SpO2 99.00%.  Physical Exam  Constitutional: She is oriented to person, place, and time. She appears well-developed and well-nourished. No distress.  HENT:  Head: Normocephalic and atraumatic.  Eyes: Pupils are equal, round, and reactive to light.  Neck: Normal range of motion. Neck supple.  Cardiovascular: Normal rate.   Respiratory: Effort normal and breath sounds normal.  GI: Soft.  Bowel sounds are normal. She exhibits no distension and no mass. There is no tenderness. There is no rebound and no guarding.  Genitourinary: Vagina normal and uterus normal. No vaginal discharge found.  Cervix closed and long  Musculoskeletal: Normal range of motion.  Neurological: She is alert and oriented to person, place, and time.  Skin: Skin is warm and dry.  Psychiatric: She has a normal mood and affect. Her behavior is normal. Thought content normal.  Spanish speaker-used interperter   Results for orders placed during the hospital encounter of 10/05/12 (from the past 24 hour(s))  URINALYSIS, ROUTINE W REFLEX MICROSCOPIC     Status: None   Collection Time    10/05/12  9:46 AM      Result Value Range   Color, Urine YELLOW  YELLOW   APPearance CLEAR  CLEAR   Specific Gravity, Urine 1.010  1.005 - 1.030   pH 7.0  5.0 - 8.0   Glucose, UA NEGATIVE  NEGATIVE mg/dL   Hgb urine dipstick NEGATIVE  NEGATIVE   Bilirubin Urine NEGATIVE  NEGATIVE   Ketones, ur NEGATIVE  NEGATIVE mg/dL   Protein, ur NEGATIVE  NEGATIVE mg/dL   Urobilinogen, UA 0.2  0.0 - 1.0 mg/dL   Nitrite NEGATIVE  NEGATIVE   Leukocytes, UA NEGATIVE  NEGATIVE     MAU Course  Procedures  MDM Percocet for pain U/S/ OB limited BPP Discussed care plan with Dr Tamela Oddi  Assessment  and Plan  Abdomenal/ Back pain in pregnancy Decreased fetal movement  Carolynn Serve 10/05/2012, 10:16 AM

## 2012-10-05 NOTE — MAU Note (Signed)
Patient c/o of upper abdominal and lower back pain rating at a 5 for the past week when she walks. Reports that they do not feel like contractions. Patient also reports decreased fetal movement for since last night. Patient denies vaginal bleeding and lof. This is patient's 5th pregnancy.

## 2012-10-25 ENCOUNTER — Encounter (HOSPITAL_COMMUNITY): Payer: Self-pay | Admitting: *Deleted

## 2012-10-25 ENCOUNTER — Inpatient Hospital Stay (HOSPITAL_COMMUNITY)
Admission: AD | Admit: 2012-10-25 | Discharge: 2012-10-26 | Disposition: A | Discharge status: Home / Self Care | Payer: No Typology Code available for payment source | Source: Ambulatory Visit | Attending: Obstetrics | Admitting: Obstetrics

## 2012-10-25 DIAGNOSIS — N949 Unspecified condition associated with female genital organs and menstrual cycle: Secondary | ICD-10-CM | POA: Insufficient documentation

## 2012-10-25 DIAGNOSIS — O99891 Other specified diseases and conditions complicating pregnancy: Secondary | ICD-10-CM | POA: Insufficient documentation

## 2012-10-25 NOTE — MAU Note (Signed)
Pt presents to MAU with complaint of pressure and feeling like something was going to fall out. Taken directly to room 10. Interpreter at bedside

## 2012-10-31 ENCOUNTER — Encounter (HOSPITAL_COMMUNITY): Payer: Self-pay | Admitting: *Deleted

## 2012-10-31 ENCOUNTER — Inpatient Hospital Stay (HOSPITAL_COMMUNITY): Payer: Medicaid Other | Admitting: Anesthesiology

## 2012-10-31 ENCOUNTER — Inpatient Hospital Stay (HOSPITAL_COMMUNITY)
Admission: AD | Admit: 2012-10-31 | Discharge: 2012-11-02 | DRG: 775 | Disposition: A | Discharge status: Home / Self Care | Payer: Medicaid Other | Source: Ambulatory Visit | Attending: Obstetrics | Admitting: Obstetrics

## 2012-10-31 ENCOUNTER — Encounter (HOSPITAL_COMMUNITY): Payer: Self-pay | Admitting: Anesthesiology

## 2012-10-31 DIAGNOSIS — O09529 Supervision of elderly multigravida, unspecified trimester: Principal | ICD-10-CM | POA: Diagnosis present

## 2012-10-31 HISTORY — DX: Major depressive disorder, single episode, unspecified: F32.9

## 2012-10-31 HISTORY — DX: Depression, unspecified: F32.A

## 2012-10-31 LAB — CBC
HCT: 32.6 % — ABNORMAL LOW (ref 36.0–46.0)
Hemoglobin: 10.9 g/dL — ABNORMAL LOW (ref 12.0–15.0)
MCH: 26 pg (ref 26.0–34.0)
MCHC: 33.4 g/dL (ref 30.0–36.0)
MCV: 77.6 fL — ABNORMAL LOW (ref 78.0–100.0)
Platelets: 220 10*3/uL (ref 150–400)
RBC: 4.2 MIL/uL (ref 3.87–5.11)
RDW: 15.8 % — ABNORMAL HIGH (ref 11.5–15.5)
WBC: 12.5 10*3/uL — ABNORMAL HIGH (ref 4.0–10.5)

## 2012-10-31 LAB — TYPE AND SCREEN
ABO/RH(D): O POS
Antibody Screen: NEGATIVE

## 2012-10-31 LAB — ABO/RH: ABO/RH(D): O POS

## 2012-10-31 LAB — RPR: RPR Ser Ql: NONREACTIVE

## 2012-10-31 MED ORDER — FENTANYL 2.5 MCG/ML BUPIVACAINE 1/10 % EPIDURAL INFUSION (WH - ANES)
14.0000 mL/h | INTRAMUSCULAR | Status: DC | PRN
  Filled 2012-10-31: qty 125

## 2012-10-31 MED ORDER — PHENYLEPHRINE 40 MCG/ML (10ML) SYRINGE FOR IV PUSH (FOR BLOOD PRESSURE SUPPORT)
80.0000 ug | PREFILLED_SYRINGE | INTRAVENOUS | Status: DC | PRN
  Filled 2012-10-31: qty 2

## 2012-10-31 MED ORDER — OXYTOCIN BOLUS FROM INFUSION: 500.0000 mL | INTRAVENOUS | Status: DC

## 2012-10-31 MED ORDER — LANOLIN HYDROUS EX OINT: TOPICAL_OINTMENT | CUTANEOUS | Status: DC | PRN

## 2012-10-31 MED ORDER — DIPHENHYDRAMINE HCL 25 MG PO CAPS: 25.0000 mg | ORAL_CAPSULE | Freq: Four times a day (QID) | ORAL | Status: DC | PRN

## 2012-10-31 MED ORDER — IBUPROFEN 600 MG PO TABS
600.0000 mg | ORAL_TABLET | Freq: Four times a day (QID) | ORAL | Status: DC
  Administered 2012-11-01 – 2012-11-02 (×6): 600 mg via ORAL
  Filled 2012-10-31 (×2): qty 1

## 2012-10-31 MED ORDER — SIMETHICONE 80 MG PO CHEW: 80.0000 mg | CHEWABLE_TABLET | ORAL | Status: DC | PRN

## 2012-10-31 MED ORDER — OXYTOCIN 40 UNITS IN LACTATED RINGERS INFUSION - SIMPLE MED
1.0000 m[IU]/min | INTRAVENOUS | Status: DC
  Administered 2012-10-31: 2 m[IU]/min via INTRAVENOUS
  Filled 2012-10-31: qty 1000

## 2012-10-31 MED ORDER — PHENYLEPHRINE 40 MCG/ML (10ML) SYRINGE FOR IV PUSH (FOR BLOOD PRESSURE SUPPORT)
80.0000 ug | PREFILLED_SYRINGE | INTRAVENOUS | Status: DC | PRN
  Filled 2012-10-31: qty 5
  Filled 2012-10-31: qty 2

## 2012-10-31 MED ORDER — OXYTOCIN 40 UNITS IN LACTATED RINGERS INFUSION - SIMPLE MED
62.5000 mL/h | INTRAVENOUS | Status: DC
  Administered 2012-10-31: 62.5 mL/h via INTRAVENOUS

## 2012-10-31 MED ORDER — EPHEDRINE 5 MG/ML INJ
10.0000 mg | INTRAVENOUS | Status: DC | PRN
  Filled 2012-10-31: qty 2

## 2012-10-31 MED ORDER — ONDANSETRON HCL 4 MG/2ML IJ SOLN: 4.0000 mg | Freq: Four times a day (QID) | INTRAMUSCULAR | Status: DC | PRN

## 2012-10-31 MED ORDER — ONDANSETRON HCL 4 MG/2ML IJ SOLN: 4.0000 mg | INTRAMUSCULAR | Status: DC | PRN

## 2012-10-31 MED ORDER — FLEET ENEMA 7-19 GM/118ML RE ENEM: 1.0000 | ENEMA | Freq: Once | RECTAL | Status: DC

## 2012-10-31 MED ORDER — BENZOCAINE-MENTHOL 20-0.5 % EX AERO: 1.0000 "application " | INHALATION_SPRAY | CUTANEOUS | Status: DC | PRN

## 2012-10-31 MED ORDER — CITRIC ACID-SODIUM CITRATE 334-500 MG/5ML PO SOLN: 30.0000 mL | ORAL | Status: DC | PRN

## 2012-10-31 MED ORDER — NALBUPHINE SYRINGE 5 MG/0.5 ML
5.0000 mg | INJECTION | INTRAMUSCULAR | Status: DC | PRN
  Filled 2012-10-31: qty 0.5

## 2012-10-31 MED ORDER — ONDANSETRON HCL 4 MG PO TABS: 4.0000 mg | ORAL_TABLET | ORAL | Status: DC | PRN

## 2012-10-31 MED ORDER — TERBUTALINE SULFATE 1 MG/ML IJ SOLN: 0.2500 mg | Freq: Once | INTRAMUSCULAR | Status: DC | PRN

## 2012-10-31 MED ORDER — FERROUS SULFATE 325 (65 FE) MG PO TABS
325.0000 mg | ORAL_TABLET | Freq: Two times a day (BID) | ORAL | Status: DC
  Administered 2012-11-01 – 2012-11-02 (×3): 325 mg via ORAL
  Filled 2012-10-31 (×3): qty 1

## 2012-10-31 MED ORDER — TETANUS-DIPHTH-ACELL PERTUSSIS 5-2.5-18.5 LF-MCG/0.5 IM SUSP
0.5000 mL | Freq: Once | INTRAMUSCULAR | Status: AC
  Administered 2012-11-01: 0.5 mL via INTRAMUSCULAR
  Filled 2012-10-31: qty 0.5

## 2012-10-31 MED ORDER — LACTATED RINGERS IV SOLN
500.0000 mL | Freq: Once | INTRAVENOUS | Status: AC
  Administered 2012-10-31: 500 mL via INTRAVENOUS

## 2012-10-31 MED ORDER — EPHEDRINE 5 MG/ML INJ
10.0000 mg | INTRAVENOUS | Status: DC | PRN
  Filled 2012-10-31: qty 4
  Filled 2012-10-31: qty 2

## 2012-10-31 MED ORDER — OXYCODONE-ACETAMINOPHEN 5-325 MG PO TABS
1.0000 | ORAL_TABLET | ORAL | Status: DC | PRN
  Filled 2012-10-31 (×2): qty 1

## 2012-10-31 MED ORDER — INFLUENZA VAC SPLIT QUAD 0.5 ML IM SUSP
0.5000 mL | INTRAMUSCULAR | Status: AC
  Administered 2012-11-01: 0.5 mL via INTRAMUSCULAR
  Filled 2012-10-31: qty 0.5

## 2012-10-31 MED ORDER — WITCH HAZEL-GLYCERIN EX PADS: 1.0000 "application " | MEDICATED_PAD | CUTANEOUS | Status: DC | PRN

## 2012-10-31 MED ORDER — PRENATAL MULTIVITAMIN CH
1.0000 | ORAL_TABLET | Freq: Every day | ORAL | Status: DC
  Administered 2012-11-01: 1 via ORAL

## 2012-10-31 MED ORDER — DIPHENHYDRAMINE HCL 50 MG/ML IJ SOLN: 12.5000 mg | INTRAMUSCULAR | Status: DC | PRN

## 2012-10-31 MED ORDER — DIBUCAINE 1 % RE OINT: 1.0000 "application " | TOPICAL_OINTMENT | RECTAL | Status: DC | PRN

## 2012-10-31 MED ORDER — LIDOCAINE HCL (PF) 1 % IJ SOLN
30.0000 mL | INTRAMUSCULAR | Status: DC | PRN
  Filled 2012-10-31 (×2): qty 30

## 2012-10-31 MED ORDER — OXYCODONE-ACETAMINOPHEN 5-325 MG PO TABS
1.0000 | ORAL_TABLET | ORAL | Status: DC | PRN
Start: 2012-10-31 — End: 2012-11-02
  Administered 2012-11-01 – 2012-11-02 (×4): 1 via ORAL
  Filled 2012-10-31 (×2): qty 1

## 2012-10-31 MED ORDER — LACTATED RINGERS IV SOLN
INTRAVENOUS | Status: DC
  Administered 2012-10-31 (×2): via INTRAVENOUS

## 2012-10-31 MED ORDER — LIDOCAINE HCL (PF) 1 % IJ SOLN
INTRAMUSCULAR | Status: DC | PRN
  Administered 2012-10-31 (×2): 4 mL

## 2012-10-31 MED ORDER — FENTANYL 2.5 MCG/ML BUPIVACAINE 1/10 % EPIDURAL INFUSION (WH - ANES)
INTRAMUSCULAR | Status: DC | PRN
  Administered 2012-10-31: 13 mL/h via EPIDURAL

## 2012-10-31 MED ORDER — ACETAMINOPHEN 325 MG PO TABS: 650.0000 mg | ORAL_TABLET | ORAL | Status: DC | PRN

## 2012-10-31 MED ORDER — IBUPROFEN 600 MG PO TABS
600.0000 mg | ORAL_TABLET | Freq: Four times a day (QID) | ORAL | Status: DC | PRN
  Administered 2012-10-31: 600 mg via ORAL
  Filled 2012-10-31 (×3): qty 1

## 2012-10-31 MED ORDER — SENNOSIDES-DOCUSATE SODIUM 8.6-50 MG PO TABS
2.0000 | ORAL_TABLET | ORAL | Status: DC
  Administered 2012-10-31 – 2012-11-02 (×2): 2 via ORAL

## 2012-10-31 MED ORDER — LACTATED RINGERS IV SOLN: 500.0000 mL | INTRAVENOUS | Status: DC | PRN

## 2012-10-31 MED ORDER — PRENATAL MULTIVITAMIN CH
1.0000 | ORAL_TABLET | Freq: Every day | ORAL | Status: DC
  Administered 2012-11-02: 1 via ORAL
  Filled 2012-10-31: qty 1

## 2012-10-31 MED ORDER — ZOLPIDEM TARTRATE 5 MG PO TABS: 5.0000 mg | ORAL_TABLET | Freq: Every evening | ORAL | Status: DC | PRN

## 2012-10-31 NOTE — H&P (Signed)
This is Dr. Francoise Ceo dictating the history and physical on blank blank she's a 37 year old gravida 5 para 404 at 16 and 5 days EDC 11/02/2012 negative GBS admitted in labor with ruptured membranes 4 cm vertex -1 station she received Pitocin and had a normal vaginal the rate of a female Apgar 9 and 9 placenta spontaneous no episiotomy or laceration Past medical history negative Past surgical history negative Social history negative System review negative Physical exam well-developed female in labor HEENT negative Lungs clear to P&A Breasts negative Heart regular rhythm no murmurs no gallops Uterus 20 week postpartum size Pelvic as described above Extremities negative

## 2012-10-31 NOTE — Anesthesia Procedure Notes (Signed)
Epidural Patient location during procedure: OB Start time: 10/31/2012 2:18 PM  Staffing Anesthesiologist: Albirda Shiel A. Performed by: anesthesiologist   Preanesthetic Checklist Completed: patient identified, site marked, surgical consent, pre-op evaluation, timeout performed, IV checked, risks and benefits discussed and monitors and equipment checked  Epidural Patient position: sitting Prep: site prepped and draped and DuraPrep Patient monitoring: continuous pulse ox and blood pressure Approach: midline Injection technique: LOR air  Needle:  Needle type: Tuohy  Needle gauge: 17 G Needle length: 9 cm and 9 Needle insertion depth: 5 cm cm Catheter type: closed end flexible Catheter size: 19 Gauge Catheter at skin depth: 10 cm Test dose: negative and Other  Assessment Events: blood not aspirated, injection not painful, no injection resistance, negative IV test and no paresthesia  Additional Notes Patient identified. Risks and benefits discussed including failed block, incomplete  Pain control, post dural puncture headache, nerve damage, paralysis, blood pressure Changes, nausea, vomiting, reactions to medications-both toxic and allergic and post Partum back pain. All questions were answered. Patient expressed understanding and wished to proceed. Sterile technique was used throughout procedure. Epidural site was Dressed with sterile barrier dressing. No paresthesias, signs of intravascular injection Or signs of intrathecal spread were encountered.  Patient was more comfortable after the epidural was dosed. Please see RN's note for documentation of vital signs and FHR which are stable.

## 2012-10-31 NOTE — Progress Notes (Signed)
Dr Gaynell Face notified of pts complaints, VE, SROM, contraction pattern, orders received to admit pt.

## 2012-10-31 NOTE — Progress Notes (Signed)
Fentanyl 44ml wasted in sink. Sandrea Hughs, RN witness.

## 2012-10-31 NOTE — Anesthesia Preprocedure Evaluation (Signed)
Anesthesia Evaluation  Patient identified by MRN, date of birth, ID band Patient awake    Reviewed: Allergy & Precautions, H&P , Patient's Chart, lab work & pertinent test results  Airway Mallampati: II TM Distance: >3 FB Neck ROM: full    Dental no notable dental hx. (+) Teeth Intact   Pulmonary neg pulmonary ROS,  breath sounds clear to auscultation  Pulmonary exam normal       Cardiovascular negative cardio ROS  Rhythm:regular Rate:Normal     Neuro/Psych PSYCHIATRIC DISORDERS Depression negative neurological ROS     GI/Hepatic negative GI ROS, Neg liver ROS,   Endo/Other  negative endocrine ROS  Renal/GU negative Renal ROS  negative genitourinary   Musculoskeletal   Abdominal Normal abdominal exam  (+)   Peds  Hematology negative hematology ROS (+)   Anesthesia Other Findings   Reproductive/Obstetrics (+) Pregnancy                           Anesthesia Physical Anesthesia Plan  ASA: II  Anesthesia Plan: Epidural   Post-op Pain Management:    Induction:   Airway Management Planned:   Additional Equipment:   Intra-op Plan:   Post-operative Plan:   Informed Consent: I have reviewed the patients History and Physical, chart, labs and discussed the procedure including the risks, benefits and alternatives for the proposed anesthesia with the patient or authorized representative who has indicated his/her understanding and acceptance.     Plan Discussed with: Anesthesiologist  Anesthesia Plan Comments:         Anesthesia Quick Evaluation

## 2012-10-31 NOTE — MAU Note (Signed)
Patient is having contractions every 30 minutes with a little bloody show. Reports good fetal movement.

## 2012-10-31 NOTE — Progress Notes (Signed)
Ephedrine and phenylnephrine wasted with Camelia Eng, RN

## 2012-11-01 LAB — CBC
Hemoglobin: 10 g/dL — ABNORMAL LOW (ref 12.0–15.0)
MCH: 25.8 pg — ABNORMAL LOW (ref 26.0–34.0)
MCHC: 33.2 g/dL (ref 30.0–36.0)
MCV: 77.8 fL — ABNORMAL LOW (ref 78.0–100.0)
RDW: 15.8 % — ABNORMAL HIGH (ref 11.5–15.5)

## 2012-11-01 NOTE — Anesthesia Postprocedure Evaluation (Signed)
Anesthesia Post Note  Patient: Katherine [name]  Procedure(s) Performed: * No procedures listed *  Anesthesia type: Epidural  Patient location: Mother/Baby  Post pain: Pain level controlled  Post assessment: Post-op Vital signs reviewed  Last Vitals:  Filed Vitals:   11/01/12 0200  BP: 121/75  Pulse: 72  Temp: 37.1 C  Resp: 18    Post vital signs: Reviewed  Level of consciousness: awake  Complications: No apparent anesthesia complications

## 2012-11-01 NOTE — Progress Notes (Signed)
UR chart review completed.  

## 2012-11-01 NOTE — Progress Notes (Signed)
Patient ID: Katherine [name], female   DOB: 1975/05/14, 37 y.o.   MRN: [id number] Postpartum day one Vital signs normal Fundus firm Lochia moderate Doing well

## 2012-11-02 NOTE — Discharge Summary (Signed)
Obstetric Discharge Summary Reason for Admission: onset of labor Prenatal Procedures: none Intrapartum Procedures: spontaneous vaginal delivery Postpartum Procedures: none Complications-Operative and Postpartum: none Hemoglobin  Date Value Range Status  11/01/2012 10.0* 12.0 - 15.0 g/dL Final     HCT  Date Value Range Status  11/01/2012 30.1* 36.0 - 46.0 % Final    Physical Exam:  General: alert Lochia: appropriate Uterine Fundus: firm Incision: healing well DVT Evaluation: No evidence of DVT seen on physical exam.  Discharge Diagnoses: Term Pregnancy-delivered  Discharge Information: Date: 11/02/2012 Activity: pelvic rest Diet: routine Medications: Percocet Condition: stable Instructions: refer to practice specific booklet Discharge to: home Follow-up Information   Schedule an appointment as soon as possible for [name] visit with Katherine Cosier, MD.   Specialty:  Obstetrics and Gynecology   Contact information:   [address] [place] [address] [phone]       Newborn Data: Live born female  Birth Weight: 6 lb 15.6 oz (3165 g) APGAR: 9, 9  Home with mother.  Katherine [name] 11/02/2012, 6:36 AM

## 2012-11-02 NOTE — Lactation Note (Signed)
This note was copied from the chart of Katherine [name]. Lactation Consultation Note  Patient Name: Katherine Gaytha Raybourn [id number] Date: 11/02/2012 Reason for consult: Follow-up assessment Eda, Spanish Interpreter present for visit. Mom reports BF is going well, she is supplementing after some feedings per her choice. Request hand pump before d/c home. Engorgement care reviewed if needed. Advised of OP services and support group. Advised to call if has questions or concerns before discharge.   Maternal Data    Feeding Feeding Type: Breast Fed  LATCH Score/Interventions Latch: Grasps breast easily, tongue down, lips flanged, rhythmical sucking.  Audible Swallowing: Spontaneous and intermittent  Type of Nipple: Everted at rest and after stimulation  Comfort (Breast/Nipple): Filling, red/small blisters or bruises, mild/mod discomfort  Problem noted: Mild/Moderate discomfort  Hold (Positioning): No assistance needed to correctly position infant at breast.  LATCH Score: 9  Lactation Tools Discussed/Used Tools: Pump Breast pump type: Manual   Consult Status Consult Status: Complete Date: 11/02/12 Follow-up type: In-patient    Alfred Levins 11/02/2012, 11:03 AM

## 2012-11-29 ENCOUNTER — Emergency Department (HOSPITAL_COMMUNITY)
Admission: EM | Admit: 2012-11-29 | Discharge: 2012-11-29 | Disposition: A | Discharge status: Home / Self Care | Payer: No Typology Code available for payment source | Attending: Emergency Medicine | Admitting: Emergency Medicine

## 2012-11-29 ENCOUNTER — Encounter (HOSPITAL_COMMUNITY): Payer: Self-pay | Admitting: Emergency Medicine

## 2012-11-29 DIAGNOSIS — K047 Periapical abscess without sinus: Secondary | ICD-10-CM | POA: Insufficient documentation

## 2012-11-29 DIAGNOSIS — K029 Dental caries, unspecified: Secondary | ICD-10-CM | POA: Insufficient documentation

## 2012-11-29 DIAGNOSIS — Z8659 Personal history of other mental and behavioral disorders: Secondary | ICD-10-CM | POA: Insufficient documentation

## 2012-11-29 DIAGNOSIS — R6884 Jaw pain: Secondary | ICD-10-CM | POA: Insufficient documentation

## 2012-11-29 DIAGNOSIS — R51 Headache: Secondary | ICD-10-CM | POA: Insufficient documentation

## 2012-11-29 DIAGNOSIS — R220 Localized swelling, mass and lump, head: Secondary | ICD-10-CM | POA: Insufficient documentation

## 2012-11-29 MED ORDER — OXYCODONE-ACETAMINOPHEN 5-325 MG PO TABS
1.0000 | ORAL_TABLET | Freq: Once | ORAL | Status: AC
  Administered 2012-11-29: 1 via ORAL
  Filled 2012-11-29: qty 1

## 2012-11-29 MED ORDER — HYDROCODONE-ACETAMINOPHEN 5-325 MG PO TABS: 1.0000 | ORAL_TABLET | Freq: Four times a day (QID) | ORAL | Status: DC | PRN

## 2012-11-29 MED ORDER — KETOROLAC TROMETHAMINE 60 MG/2ML IM SOLN
60.0000 mg | Freq: Once | INTRAMUSCULAR | Status: AC
  Administered 2012-11-29: 60 mg via INTRAMUSCULAR
  Filled 2012-11-29: qty 2

## 2012-11-29 MED ORDER — PENICILLIN V POTASSIUM 500 MG PO TABS: 500.0000 mg | ORAL_TABLET | Freq: Three times a day (TID) | ORAL | Status: DC

## 2012-11-29 NOTE — ED Notes (Signed)
Onset 3 days ago right lower dental pain constant with pain currently 10/10 achy throbbing.

## 2012-11-29 NOTE — ED Provider Notes (Signed)
CSN: [id number]     Arrival date & time 11/29/12  1548 History  This chart was scribed for non-physician practitioner Jaynie Crumble working with Derwood Kaplan, MD by Ronal Fear, ED scribe. This patient was seen in room TR04C/TR04C and the patient's care was started at 4:35 PM.     Chief Complaint  Patient presents with  . Dental Pain   Patient is a 37 y.o. female presenting with tooth pain. The history is provided by the patient and a relative. The history is limited by a language barrier. No language interpreter was used.  Dental Pain Location:  Lower Lower teeth location:  30/RL 1st molar Quality:  Aching Severity:  Mild Onset quality:  Gradual Duration:  2 weeks Timing:  Constant Progression:  Worsening Chronicity:  New Context: abscess and dental caries   Relieved by:  Nothing Worsened by:  Hot food/drink and cold food/drink Ineffective treatments:  Acetaminophen Associated symptoms: facial pain and facial swelling   Associated symptoms: no congestion, no difficulty swallowing and no headaches    HPI Comments: Katherine [name] is a 37 y.o. female who presents to the Emergency Department complaining of gradually worsening gradual onset left side jaw pain that began during her pregnancy, October 26th of this year with associated swelling that began 3x days ago. Pt has taken tylennol for the pain with no releif. She denies fever or chills.   Past Medical History  Diagnosis Date  . Depression     postpartum depression   Past Surgical History  Procedure Laterality Date  . No past surgeries     Family History  Problem Relation Age of Onset  . Diabetes Father    History  Substance Use Topics  . Smoking status: Never Smoker   . Smokeless tobacco: Not on file  . Alcohol Use: No   OB History   Grav Para Term Preterm Abortions TAB SAB Ect Mult Living   5 5 5       5      Review of Systems  HENT: Positive for dental problem and facial swelling. Negative for  congestion.   Neurological: Negative for headaches.  All other systems reviewed and are negative.    Allergies  Review of patient's allergies indicates no known allergies.  Home Medications   Current Outpatient Rx  Name  Route  Sig  Dispense  Refill  . Prenatal Vit-Fe Fumarate-FA (PRENATAL MULTIVITAMIN) TABS tablet   Oral   Take 1 tablet by mouth daily at 12 noon.          BP 156/72  Pulse 60  Temp(Src) 98.8 F (37.1 C) (Oral)  Resp 16  Ht 5\' 3"  (1.6 m)  Wt 202 lb (91.627 kg)  BMI 35.79 kg/m2  SpO2 97% Physical Exam  Nursing note and vitals reviewed. Constitutional: She is oriented to person, place, and time. She appears well-developed and well-nourished. No distress.  HENT:  Head: Normocephalic.  Abscess to right lower 1st molar. Mild right lower facial swelling. Tender to palpation. Dental caries. No swelling under the tongue. No trismus  Eyes: Conjunctivae are normal.  Neck: Neck supple.  Cardiovascular: Normal rate, regular rhythm and normal heart sounds.   Pulmonary/Chest: Effort normal and breath sounds normal. No respiratory distress. She has no wheezes. She has no rales.  Neurological: She is alert and oriented to person, place, and time.  Skin: Skin is warm and dry.    ED Course  Procedures (including critical care time) DIAGNOSTIC STUDIES: Oxygen Saturation is 97% on  RA, adequate by my interpretation.    COORDINATION OF CARE:    4:39 PM- Pt advised of plan for treatment including antibiotics and pain medication. The pt will be given a referral to a dentist and pt agrees.   Labs Review Labs Reviewed - No data to display Imaging Review No results found.  EKG Interpretation   None       MDM   1. Dental abscess     Patient with a dental abscess. Will start on penicillin, Norco for pain. Explained the importance of following up with a dentist or an oral surgeon. At this time patient is not septic. Her vital signs are normal. No signs of  Ludwig's angina or extensive facial cellulitis Filed Vitals:   11/29/12 1605  BP: 156/72  Pulse: 60  Temp: 98.8 F (37.1 C)  TempSrc: Oral  Resp: 16  Height: 5\' 3"  (1.6 m)  Weight: 202 lb (91.627 kg)  SpO2: 97%    I personally performed the services described in this documentation, which was scribed in my presence. The recorded information has been reviewed and is accurate.   Lottie Mussel, PA-C 11/30/12 0116

## 2012-12-03 NOTE — ED Provider Notes (Signed)
Medical screening examination/treatment/procedure(s) were performed by non-physician practitioner and as supervising physician I was immediately available for consultation/collaboration.  EKG Interpretation   None        Xzavier Swinger, MD 12/03/12 1629 

## 2012-12-15 ENCOUNTER — Ambulatory Visit: Payer: No Typology Code available for payment source | Attending: Internal Medicine | Admitting: Internal Medicine

## 2012-12-15 ENCOUNTER — Encounter: Payer: Self-pay | Admitting: Internal Medicine

## 2012-12-15 VITALS — BP 106/71 | HR 50 | Temp 98.5°F | Ht 63.0 in | Wt 199.4 lb

## 2012-12-15 DIAGNOSIS — K029 Dental caries, unspecified: Secondary | ICD-10-CM | POA: Insufficient documentation

## 2012-12-15 NOTE — Progress Notes (Signed)
Patient ID: Katherine [name], female   DOB: 09-18-1975, 37 y.o.   MRN: [id number] Patient Demographics  Katherine [name], is a 37 y.o. female  WUJ:[id number]  [id number]  DOB - [birth year]  Chief Complaint  Patient presents with  . Dental Pain    x1 week, P=4        Subjective:   Katherine [name] is a 37 y.o. female here today for a follow up visit. Patient has no new complaint except that she has not gotten the referral for dentistry that she requested in her previous clinic visit. She wants the referral to be made again. No fever, no headache, no swelling Patient has No headache, No chest pain, No abdominal pain - No Nausea, No new weakness tingling or numbness, No Cough - SOB.  ALLERGIES: No Known Allergies  PAST MEDICAL HISTORY: Past Medical History  Diagnosis Date  . Depression     postpartum depression    MEDICATIONS AT HOME: Prior to Admission medications   Medication Sig Start Date End Date Taking? Authorizing Provider  HYDROcodone-acetaminophen (NORCO) 5-325 MG per tablet Take 1 tablet by mouth every 6 (six) hours as needed for moderate pain. 11/29/12  Yes Tatyana A Kirichenko, PA-C  penicillin v potassium (VEETID) 500 MG tablet Take 1 tablet (500 mg total) by mouth 3 (three) times daily. 11/29/12  Yes Tatyana A Kirichenko, PA-C  Prenatal Vit-Fe Fumarate-FA (PRENATAL MULTIVITAMIN) TABS tablet Take 1 tablet by mouth daily at 12 noon.    Historical Provider, MD     Objective:   Filed Vitals:   12/15/12 0945  BP: 106/71  Pulse: 50  Temp: 98.5 F (36.9 C)  TempSrc: Oral  Height: 5\' 3"  (1.6 m)  Weight: 199 lb 6.4 oz (90.447 kg)  SpO2: 98%    Exam General appearance : Awake, alert, not in any distress. Speech Clear. Not toxic looking HEENT: Atraumatic and Normocephalic, pupils equally reactive to light and accomodation, multiple dental caries Neck: supple, no JVD. No cervical lymphadenopathy.  Chest:Good air entry bilaterally, no added  sounds  CVS: S1 S2 regular, no murmurs.  Abdomen: Bowel sounds present, Non tender and not distended with no gaurding, rigidity or rebound. Extremities: B/L Lower Ext shows no edema, both legs are warm to touch Neurology: Awake alert, and oriented X 3, CN II-XII intact, Non focal Skin:No Rash Wounds:N/A   Data Review   CBC No results found for this basename: WBC, HGB, HCT, PLT, MCV, MCH, MCHC, RDW, NEUTRABS, LYMPHSABS, MONOABS, EOSABS, BASOSABS, BANDABS, BANDSABD,  in the last 168 hours  Chemistries   No results found for this basename: NA, K, CL, CO2, GLUCOSE, BUN, CREATININE, GFRCGP, CALCIUM, MG, AST, ALT, ALKPHOS, BILITOT,  in the last 168 hours ------------------------------------------------------------------------------------------------------------------ No results found for this basename: HGBA1C,  in the last 72 hours ------------------------------------------------------------------------------------------------------------------ No results found for this basename: CHOL, HDL, LDLCALC, TRIG, CHOLHDL, LDLDIRECT,  in the last 72 hours ------------------------------------------------------------------------------------------------------------------ No results found for this basename: TSH, T4TOTAL, FREET3, T3FREE, THYROIDAB,  in the last 72 hours ------------------------------------------------------------------------------------------------------------------ No results found for this basename: VITAMINB12, FOLATE, FERRITIN, TIBC, IRON, RETICCTPCT,  in the last 72 hours  Coagulation profile  No results found for this basename: INR, PROTIME,  in the last 168 hours    Assessment & Plan   1. Dental caries  - Ambulatory referral to Dentistry  Patient extensively counseled about nutrition and exercise  Follow up in 3 months or when necessary   The patient was given clear instructions to go to ER or  return to medical center if symptoms don't improve, worsen or new problems  develop. The patient verbalized understanding. The patient was told to call to get lab results if they haven't heard anything in the next week.    Jeanann Lewandowsky, MD, MHA, FACP, FAAP [facility] [place], [place] [phone]   12/15/2012, 11:45 AM

## 2012-12-15 NOTE — Progress Notes (Signed)
Pt is here due to a toothache x1 week.

## 2012-12-15 NOTE — Patient Instructions (Signed)
Caries dental  (Dental Caries) La caries dental es la ms comn de todas las enfermedades de la boca. Ocurre en todas las edades, pero es ms frecuente en nios y adultos jvenes.  CMO SE DESARROLLA LA CARIES DENTAL  El proceso de caries comienza cuando las bacterias de la boca se combinan con los alimentos, (especialmente azcares y almidones) para producir placa. La placa es una sustancia que se adhiere a las superficies duras de los dientes (esmalte dental). Las bacterias de la placa producen cidos que atacan el esmalte de los dientes. Estos cidos tambin pueden atacar la superficie de la raz de un diente (cemento) si este est expuesto. Los ataques repetidos disuelven estas superficies y crean huecos en el diente (cavidades). Si no se tratan, los cidos destruyen las otras capas del diente.  FACTORES DE RIESGO   El consumo frecuente de bebidas azucaradas.   El consumo frecuente de alimentos que contienen azcar y almidn y de aquellos que se quedan fcilmente adheridos a los dientes.   Higiene bucal deficiente.   Sequedad en la boca.   Abuso de sustancias como metanfetaminas.   Arreglos dentales en mal estado o mal hechos.   Trastornos de la alimentacin.   Reflujo gastroesofgico (ERGE).   Ciertos tratamientos de radiacin en la cabeza y el cuello. SNTOMAS  En las etapas tempranas de la caries dental, rara vez hay sntomas. En algunos casos pueden observarse zonas blancas, con aspecto de tiza, sobre el esmalte u otras capas del diente. En las etapas posteriores, los sntomas incluyen:   Hoyos y huecos en el esmalte.  Dolor en los dientes despus de consumir alimentos o bebidas dulces, calientes o fros.  Dolor alrededor del diente.  Inflamacin alrededor del diente. DIAGNSTICO  La mayora de las veces, la caries dental se detecta durante un control habitual. El diagnstico se realiza despus hacer de una detallada historia mdica y odontolgica y de la  observacin de las superficies de los dientes buscando signos de caries dental. En algunos casos se utilizan instrumentos especiales, como rayos lser, para buscar caries dentales. Podrn tomarle radiografas dentales de modo que puedan buscarse caries que no se observan a simple vista (como entre las zonas de contacto entre dos dientes).  TRATAMIENTO  Si la caries dental se encuentra en una etapa temprana, podr revertirse con un tratamiento con flor o la aplicacin de un agente remineralizante en el consultorio del dentista. Es necesario un buen cepillado y el uso del hilo dental para ayudar a estos tratamientos. Si est en etapas ms avanzadas, el tratamiento depender de la ubicacin y la extensin de la destruccin dental:   Si se ha destruido una pequea zona del diente, la zona ser removida y las cavidades se llenarn con un material como una amalgama de oro o plata o un compuesto de resina.   Si se ha destruido una zona grande del diente, la zona destruida ser removida y se colocar una cubierta (corona) sobre la estructura que quede del diente.   Si est afectada la parte central del diente (pulpa), ser necesario realizar un procedimiento llamado tratamiento de conducto antes de llenar la cavidad o colocar una corona.   Si la mayor parte del diente est destruido, ser necesario extirpar el diente (extraerlo). INSTRUCCIONES PARA EL CUIDADO EN EL HOGAR  Podr evitar, detener o revertir las caries dentales en su casa, con una buena higiene bucal. La buena higiene bucal incluye:   Una buena higiene de los dientes al menos dos veces por   da con cepillo e hilo dental.   Use una pasta dental con flor. Tambin puede usar un enjuague dental con flor si se lo recomienda el odontlogo o el mdico.   Limite la cantidad de alimentos y bebidas que contengan azcar y almidones que consume.   Evite el consumo frecuente de estos alimentos y bebidas.   Cumpla con las visitas a un dentista  para realizar controles y limpieza regulares. PREVENCIN   Mantenga una buena higiene bucal.  Considere un sellador dental. Un sellador dental es un revestimiento de material que aplica el dentista a las muescas y huecos de los dientes. El sellador impide que los alimentos queden atrapados en los huecos. Puede proteger a los dientes durante varios aos.  Pida suplementos con flor si vive en una comunidad cuya agua no tiene flor o con agua que tenga bajo contenido de flor. Use suplementos de flor segn las indicaciones del odontlogo o el mdico.  Permita las aplicaciones de flor en los dientes si se lo indica el odontlogo o el mdico. Document Released: 01/12/2005 Document Revised: 09/14/2012 [employer] Patient Information 2014 [employer].  

## 2013-01-17 ENCOUNTER — Ambulatory Visit: Payer: No Typology Code available for payment source

## 2013-01-26 HISTORY — PX: UPPER GI ENDOSCOPY: SHX6162

## 2013-03-27 ENCOUNTER — Ambulatory Visit: Payer: No Typology Code available for payment source | Attending: Internal Medicine | Admitting: Internal Medicine

## 2013-03-27 ENCOUNTER — Encounter: Payer: Self-pay | Admitting: Internal Medicine

## 2013-03-27 VITALS — BP 114/71 | HR 62 | Temp 97.9°F | Resp 17 | Wt 210.6 lb

## 2013-03-27 DIAGNOSIS — M5380 Other specified dorsopathies, site unspecified: Secondary | ICD-10-CM | POA: Insufficient documentation

## 2013-03-27 DIAGNOSIS — M545 Low back pain, unspecified: Secondary | ICD-10-CM | POA: Insufficient documentation

## 2013-03-27 DIAGNOSIS — R109 Unspecified abdominal pain: Secondary | ICD-10-CM

## 2013-03-27 DIAGNOSIS — M62838 Other muscle spasm: Secondary | ICD-10-CM

## 2013-03-27 MED ORDER — IBUPROFEN 800 MG PO TABS: 800.0000 mg | ORAL_TABLET | Freq: Three times a day (TID) | ORAL | Status: DC | PRN

## 2013-03-27 MED ORDER — CYCLOBENZAPRINE HCL 10 MG PO TABS: 10.0000 mg | ORAL_TABLET | Freq: Every day | ORAL | Status: DC

## 2013-03-27 NOTE — Progress Notes (Signed)
Used interpreter line Patient complains of mid back pain Also states has a lump on her umbilical area

## 2013-03-27 NOTE — Progress Notes (Signed)
MRN: [id number] Name: Katherine [name]  Sex: female Age: 38 y.o. DOB: January 25, 1976  Allergies: Review of patient's allergies indicates no known allergies.  Chief Complaint  Patient presents with  . Back Pain    HPI: Patient is 38 y.o. female who comes today reported to have low back pain she has this pain for several months but for the last 3 weeks is more worse, she denies any fall or trauma denies any urinary or stool incontinence, she tried over-the-counter medication without much improvement, she denies any fever chills, she also reported to have periumbilical pain on and off for more than one year, she denies any nausea vomiting change in bowel habits.   Past Medical History  Diagnosis Date  . Depression     postpartum depression    Past Surgical History  Procedure Laterality Date  . No past surgeries        Medication List       This list is accurate as of: 03/27/13 11:59 AM.  Always use your most recent med list.               cyclobenzaprine 10 MG tablet  Commonly known as:  FLEXERIL  Take 1 tablet (10 mg total) by mouth at bedtime.     HYDROcodone-acetaminophen 5-325 MG per tablet  Commonly known as:  NORCO  Take 1 tablet by mouth every 6 (six) hours as needed for moderate pain.     ibuprofen 800 MG tablet  Commonly known as:  ADVIL,MOTRIN  Take 1 tablet (800 mg total) by mouth every 8 (eight) hours as needed.     penicillin v potassium 500 MG tablet  Commonly known as:  VEETID  Take 1 tablet (500 mg total) by mouth 3 (three) times daily.     prenatal multivitamin Tabs tablet  Take 1 tablet by mouth daily at 12 noon.        Meds ordered this encounter  Medications  . ibuprofen (ADVIL,MOTRIN) 800 MG tablet    Sig: Take 1 tablet (800 mg total) by mouth every 8 (eight) hours as needed.    Dispense:  30 tablet    Refill:  1  . cyclobenzaprine (FLEXERIL) 10 MG tablet    Sig: Take 1 tablet (10 mg total) by mouth at bedtime.    Dispense:  30  tablet    Refill:  1    Immunization History  Administered Date(s) Administered  . Influenza,inj,Quad PF,36+ Mos 11/01/2012  . Tdap 11/01/2012    Family History  Problem Relation Age of Onset  . Diabetes Father     History  Substance Use Topics  . Smoking status: Never Smoker   . Smokeless tobacco: Not on file  . Alcohol Use: No    Review of Systems   As noted in HPI  Filed Vitals:   03/27/13 1130  BP: 114/71  Pulse: 62  Temp: 97.9 F (36.6 C)  Resp: 17    Physical Exam  Physical Exam  Constitutional: No distress.  Eyes: EOM are normal. Pupils are equal, round, and reactive to light.  Cardiovascular: Normal rate and regular rhythm.   Pulmonary/Chest: Breath sounds normal. No respiratory distress. She has no wheezes. She has no rales.  Abdominal: There is no rebound and no guarding.  Some periumbilical tenderness   Musculoskeletal:  Lower spinal and paraspinal tenderness with SLR test patient complained of muscle pull on the back     CBC    Component Value Date/Time  WBC 13.1* 11/01/2012 0605   RBC 3.87 11/01/2012 0605   HGB 10.0* 11/01/2012 0605   HCT 30.1* 11/01/2012 0605   PLT 200 11/01/2012 0605   MCV 77.8* 11/01/2012 0605   LYMPHSABS 3.6 03/07/2012 2040   MONOABS 0.9 03/07/2012 2040   EOSABS 0.3 03/07/2012 2040   BASOSABS 0.1 03/07/2012 2040    CMP     Component Value Date/Time   NA 137 03/07/2012 2239   K 3.3* 03/07/2012 2239   CL 103 03/07/2012 2239   CO2 24 03/07/2012 2239   GLUCOSE 111* 03/07/2012 2239   BUN 10 03/07/2012 2239   CREATININE 0.54 03/07/2012 2239   CALCIUM 9.0 03/07/2012 2239   PROT 6.8 03/07/2012 2040   ALBUMIN 3.3* 03/07/2012 2040   AST 16 03/07/2012 2040   ALT 16 03/07/2012 2040   ALKPHOS 77 03/07/2012 2040   BILITOT 0.1* 03/07/2012 2040   GFRNONAA >90 03/07/2012 2239   GFRAA >90 03/07/2012 2239    No results found for this basename: chol, tri, ldl    No components found with this basename: hga1c    Lab Results  Component  Value Date/Time   AST 16 03/07/2012  8:40 PM    Assessment and Plan  Lower back pain - Plan: ibuprofen (ADVIL,MOTRIN) 800 MG tablet advised patient to apply heating pad.  Muscle spasm - Plan: cyclobenzaprine (FLEXERIL) 10 MG tablet each bedtime  Abdominal pain - Plan: US Abdomen Complete   Return in about 3 months (around 06/27/2013), or if symptoms worsen or fail to improve.  Doris CheadleADVANI, Waver Dibiasio, MD

## 2013-04-04 ENCOUNTER — Ambulatory Visit (HOSPITAL_COMMUNITY)
Admission: RE | Admit: 2013-04-04 | Discharge: 2013-04-04 | Disposition: A | Discharge status: Home / Self Care | Payer: No Typology Code available for payment source | Source: Ambulatory Visit | Attending: Internal Medicine | Admitting: Internal Medicine

## 2013-04-04 DIAGNOSIS — R1033 Periumbilical pain: Secondary | ICD-10-CM | POA: Insufficient documentation

## 2013-04-04 DIAGNOSIS — R109 Unspecified abdominal pain: Secondary | ICD-10-CM

## 2013-04-04 DIAGNOSIS — R161 Splenomegaly, not elsewhere classified: Secondary | ICD-10-CM | POA: Insufficient documentation

## 2013-07-03 ENCOUNTER — Telehealth: Payer: Self-pay | Admitting: Internal Medicine

## 2013-07-03 NOTE — Telephone Encounter (Signed)
Pt calling regarding results for x-ray completed on 04/04/13. Please f/u with pt.

## 2013-07-31 ENCOUNTER — Ambulatory Visit: Payer: No Typology Code available for payment source | Attending: Internal Medicine | Admitting: Internal Medicine

## 2013-07-31 ENCOUNTER — Encounter: Payer: Self-pay | Admitting: Internal Medicine

## 2013-07-31 VITALS — BP 106/68 | HR 63 | Temp 98.4°F | Resp 16

## 2013-07-31 DIAGNOSIS — R1319 Other dysphagia: Secondary | ICD-10-CM

## 2013-07-31 DIAGNOSIS — R1310 Dysphagia, unspecified: Secondary | ICD-10-CM | POA: Insufficient documentation

## 2013-07-31 DIAGNOSIS — R1033 Periumbilical pain: Secondary | ICD-10-CM | POA: Insufficient documentation

## 2013-07-31 DIAGNOSIS — R161 Splenomegaly, not elsewhere classified: Secondary | ICD-10-CM | POA: Insufficient documentation

## 2013-07-31 DIAGNOSIS — Z862 Personal history of diseases of the blood and blood-forming organs and certain disorders involving the immune mechanism: Secondary | ICD-10-CM | POA: Insufficient documentation

## 2013-07-31 DIAGNOSIS — Z139 Encounter for screening, unspecified: Secondary | ICD-10-CM

## 2013-07-31 LAB — CBC WITH DIFFERENTIAL/PLATELET
BASOS PCT: 0 % (ref 0–1)
Basophils Absolute: 0 10*3/uL (ref 0.0–0.1)
Eosinophils Absolute: 0.2 10*3/uL (ref 0.0–0.7)
Eosinophils Relative: 2 % (ref 0–5)
HEMATOCRIT: 33.2 % — AB (ref 36.0–46.0)
HEMOGLOBIN: 11.7 g/dL — AB (ref 12.0–15.0)
LYMPHS ABS: 2.6 10*3/uL (ref 0.7–4.0)
Lymphocytes Relative: 28 % (ref 12–46)
MCH: 27.5 pg (ref 26.0–34.0)
MCHC: 35.2 g/dL (ref 30.0–36.0)
MCV: 78.1 fL (ref 78.0–100.0)
MONO ABS: 0.7 10*3/uL (ref 0.1–1.0)
MONOS PCT: 8 % (ref 3–12)
NEUTROS ABS: 5.7 10*3/uL (ref 1.7–7.7)
Neutrophils Relative %: 62 % (ref 43–77)
Platelets: 267 10*3/uL (ref 150–400)
RBC: 4.25 MIL/uL (ref 3.87–5.11)
RDW: 13.6 % (ref 11.5–15.5)
WBC: 9.2 10*3/uL (ref 4.0–10.5)

## 2013-07-31 MED ORDER — OMEPRAZOLE 20 MG PO CPDR: 20.0000 mg | DELAYED_RELEASE_CAPSULE | Freq: Every day | ORAL | Status: DC

## 2013-07-31 NOTE — Progress Notes (Signed)
MRN: [id number] Name: [name]  Sex: female Age: 38 y.o. DOB: April 26, 1975  Allergies: Review of patient's allergies indicates no known allergies.  Chief Complaint  Patient presents with  . naval pain  . Back Pain    HPI: Patient is 38 y.o. female who comes today reported to have on and off periodically pain which radiates to the back, denies any nausea vomiting or change in bowel habits, occasional relationship with food intake, she also reported to have her difficulty with swallowing she thinks the food sticks, she denies any pain with swallowing, she had an ultrasound abdomen done which reported negative for gallstones but has mild splenomegaly, her previous blood work noticed to have anemia but as per patient at that time she was pregnant, currently she denies any heavy menstrual periods.  Past Medical History  Diagnosis Date  . Depression     postpartum depression    Past Surgical History  Procedure Laterality Date  . No past surgeries        Medication List       This list is accurate as of: 07/31/13 12:09 PM.  Always use your most recent med list.               cyclobenzaprine 10 MG tablet  Commonly known as:  FLEXERIL  Take 1 tablet (10 mg total) by mouth at bedtime.     HYDROcodone-acetaminophen 5-325 MG per tablet  Commonly known as:  NORCO  Take 1 tablet by mouth every 6 (six) hours as needed for moderate pain.     ibuprofen 800 MG tablet  Commonly known as:  ADVIL,MOTRIN  Take 1 tablet (800 mg total) by mouth every 8 (eight) hours as needed.     omeprazole 20 MG capsule  Commonly known as:  PRILOSEC  Take 1 capsule (20 mg total) by mouth daily.     penicillin v potassium 500 MG tablet  Commonly known as:  VEETID  Take 1 tablet (500 mg total) by mouth 3 (three) times daily.     prenatal multivitamin Tabs tablet  Take 1 tablet by mouth daily at 12 noon.        Meds ordered this encounter  Medications  . omeprazole (PRILOSEC) 20  MG capsule    Sig: Take 1 capsule (20 mg total) by mouth daily.    Dispense:  30 capsule    Refill:  3    Immunization History  Administered Date(s) Administered  . Influenza,inj,Quad PF,36+ Mos 11/01/2012  . Tdap 11/01/2012    Family History  Problem Relation Age of Onset  . Diabetes Father     History  Substance Use Topics  . Smoking status: Never Smoker   . Smokeless tobacco: Not on file  . Alcohol Use: No    Review of Systems   As noted in HPI  Filed Vitals:   07/31/13 1140  BP: 106/68  Pulse: 63  Temp: 98.4 F (36.9 C)  Resp: 16    Physical Exam  Physical Exam  Constitutional: No distress.  Eyes: EOM are normal. Pupils are equal, round, and reactive to light.  Cardiovascular: Normal rate and regular rhythm.   Pulmonary/Chest: Breath sounds normal. No respiratory distress. She has no wheezes. She has no rales.  Abdominal:  Periumbilical tenderness with deep palpation, no rebound or guarding bowel sounds present    CBC    Component Value Date/Time   WBC 13.1* 11/01/2012 0605   RBC 3.87 11/01/2012 0605   HGB 10.0*  11/01/2012 0605   HCT 30.1* 11/01/2012 0605   PLT 200 11/01/2012 0605   MCV 77.8* 11/01/2012 0605   LYMPHSABS 3.6 03/07/2012 2040   MONOABS 0.9 03/07/2012 2040   EOSABS 0.3 03/07/2012 2040   BASOSABS 0.1 03/07/2012 2040    CMP     Component Value Date/Time   NA 137 03/07/2012 2239   K 3.3* 03/07/2012 2239   CL 103 03/07/2012 2239   CO2 24 03/07/2012 2239   GLUCOSE 111* 03/07/2012 2239   BUN 10 03/07/2012 2239   CREATININE 0.54 03/07/2012 2239   CALCIUM 9.0 03/07/2012 2239   PROT 6.8 03/07/2012 2040   ALBUMIN 3.3* 03/07/2012 2040   AST 16 03/07/2012 2040   ALT 16 03/07/2012 2040   ALKPHOS 77 03/07/2012 2040   BILITOT 0.1* 03/07/2012 2040   GFRNONAA >90 03/07/2012 2239   GFRAA >90 03/07/2012 2239    No results found for this basename: chol,  tri,  ldl    No components found with this basename: hga1c    Lab Results  Component Value Date/Time     AST 16 03/07/2012  8:40 PM    Assessment and Plan  Periumbilical abdominal pain/Spleen enlarged  - Plan: Ambulatory referral to Gastroenterology, trial of omeprazole (PRILOSEC) 20 MG capsule, will also check Lipase  History of anemia - Plan: Will check Anemia panel  Other dysphagia - Plan: Ambulatory referral to Gastroenterology   Screening - Plan: Baseline blood work CBC with Differential, COMPLETE METABOLIC PANEL WITH GFR, TSH, Vit D  25 hydroxy (rtn osteoporosis monitoring)   Return in about 3 months (around 10/31/2013), or if symptoms worsen or fail to improve.  Doris CheadleADVANI, Renai Lopata, MD

## 2013-07-31 NOTE — Progress Notes (Signed)
Patient complains of having upper back pain and pain  And pressure Around her naval Would also like results of her ultra sound

## 2013-08-01 ENCOUNTER — Telehealth: Payer: Self-pay

## 2013-08-01 LAB — COMPLETE METABOLIC PANEL WITH GFR
ALT: 10 U/L (ref 0–35)
AST: 13 U/L (ref 0–37)
Albumin: 3.8 g/dL (ref 3.5–5.2)
Alkaline Phosphatase: 88 U/L (ref 39–117)
BUN: 8 mg/dL (ref 6–23)
CALCIUM: 8.7 mg/dL (ref 8.4–10.5)
CO2: 25 meq/L (ref 19–32)
Chloride: 106 mEq/L (ref 96–112)
Creat: 0.6 mg/dL (ref 0.50–1.10)
GLUCOSE: 109 mg/dL — AB (ref 70–99)
POTASSIUM: 3.8 meq/L (ref 3.5–5.3)
Sodium: 138 mEq/L (ref 135–145)
Total Bilirubin: 0.3 mg/dL (ref 0.2–1.2)
Total Protein: 6.6 g/dL (ref 6.0–8.3)

## 2013-08-01 LAB — ANEMIA PANEL
%SAT: 15 % — ABNORMAL LOW (ref 20–55)
ABS Retic: 76.5 10*3/uL (ref 19.0–186.0)
FERRITIN: 23 ng/mL (ref 10–291)
FOLATE: 16.9 ng/mL
Iron: 52 ug/dL (ref 42–145)
RBC.: 4.25 MIL/uL (ref 3.87–5.11)
Retic Ct Pct: 1.8 % (ref 0.4–2.3)
TIBC: 354 ug/dL (ref 250–470)
UIBC: 302 ug/dL (ref 125–400)
Vitamin B-12: 490 pg/mL (ref 211–911)

## 2013-08-01 LAB — VITAMIN D 25 HYDROXY (VIT D DEFICIENCY, FRACTURES): Vit D, 25-Hydroxy: 35 ng/mL (ref 30–89)

## 2013-08-01 LAB — TSH: TSH: 0.835 u[IU]/mL (ref 0.350–4.500)

## 2013-08-01 NOTE — Telephone Encounter (Signed)
Message copied by Advika Mclelland, DENISLestine MountE L on Tue Aug 01, 2013  2:40 PM ------      Message from: Doris CheadleADVANI, DEEPAK      Created: Tue Aug 01, 2013  9:22 AM       Blood work reviewed noticed impaired fasting glucose, call and advise patient for low carbohydrate diet.      Anemia is improved, advise patient to continue to take over-the-counter iron supplement daily. ------

## 2013-08-01 NOTE — Telephone Encounter (Signed)
Interpreter line used Patient not available Message left on voice mail to return our call 

## 2013-08-30 ENCOUNTER — Encounter: Payer: Self-pay | Admitting: Internal Medicine

## 2013-11-02 ENCOUNTER — Encounter: Payer: Self-pay | Admitting: Internal Medicine

## 2013-11-02 ENCOUNTER — Ambulatory Visit (INDEPENDENT_AMBULATORY_CARE_PROVIDER_SITE_OTHER): Payer: Self-pay | Admitting: Internal Medicine

## 2013-11-02 VITALS — BP 90/60 | HR 68 | Ht 63.0 in | Wt 203.8 lb

## 2013-11-02 DIAGNOSIS — R1084 Generalized abdominal pain: Secondary | ICD-10-CM

## 2013-11-02 DIAGNOSIS — K219 Gastro-esophageal reflux disease without esophagitis: Secondary | ICD-10-CM

## 2013-11-02 MED ORDER — OMEPRAZOLE 20 MG PO CPDR: 20.0000 mg | DELAYED_RELEASE_CAPSULE | Freq: Every day | ORAL | Status: DC

## 2013-11-02 NOTE — Patient Instructions (Signed)
You have been scheduled for an endoscopy. Please follow written instructions given to you at your visit today. If you use inhalers (even only as needed), please bring them with you on the day of your procedure. Your physician has requested that you go to www.startemmi.com and enter the access code given to you at your visit today. This web site gives a general overview about your procedure. However, you should still follow specific instructions given to you by our office regarding your preparation for the procedure.    Reflujo gastroesofgico - Adultos  (Gastroesophageal Reflux Disease, Adult)  El reflujo gastroesofgico ocurre cuando el cido del estmago pasa al esfago. Cuando el cido entra en contacto con el esfago, el cido provoca dolor (inflamacin) en el esfago. Con el tiempo, pueden formarse pequeos agujeros (lceras) en el revestimiento del esfago. CAUSAS   Exceso de Runner, broadcasting/film/videopeso corporal. Esto aplica presin Eli Lilly and Companysobre el estmago, lo que hace que el cido del estmago suba hacia el esfago.  El hbito de fumar Aumenta la produccin de cido en el [place].  El consumo de alcohol. Provoca disminucin de la presin en el esfnter esofgico inferior (vlvula o anillo de msculo entre el esfago y Investment banker, corporateel estmago), permitiendo que el cido del estmago suba hacia el esfago.  Cenas a ltima hora del da y estmago lleno. Aumenta la presin y la produccin de cido en el estmago.  Malformacin en el esfnter esofgico inferior. A menudo no se halla causa.  SNTOMAS   Ardor y Radiographer, therapeuticdolor en la parte inferior del pecho detrs del esternn y en la zona media del [place]. Puede ocurrir Toys 'R' Usdos veces por semana o ms a menudo.  Dificultad para tragar.  Dolor de Advertising copywritergarganta.  Tos seca.  Sntomas similares al asma que incluyen sensacin de opresin en el pecho, falta de aire y sibilancias. DIAGNSTICO  El mdico diagnosticar el problema basndose en los sntomas. En algunos casos, se indican radiografas  y otras pruebas para verificar si hay complicaciones o para comprobar el estado del [address]est[address] y Training and development officerel esfago.  TRATAMIENTO  El mdico le indicar medicamentos de venta libre o recetados para ayudar a disminuir la produccin de cido. Consulte con su mdico antes de Corporate investment bankerempezar o agregar cualquier medicamento nuevo.  INSTRUCCIONES PARA EL CUIDADO EN EL HOGAR   Modifique los factores que pueda cambiar. Consulte con su mdico para solicitar orientacin relacionada con la prdida de peso, dejar de fumar y el consumo de alcohol.  Evite las comidas y bebidas que [address] Avenueempeoran los [place]worthproblemas, [place]:  [place]esotaBebidas con cafena o alcohlicas.  Chocolate.  Sabores a Advertising account plannermenta.  Ajo y cebolla.  Comidas muy condimentadas.  Ctricos como naranjas, limones o limas.  Alimentos que contengan tomate, como salsas, [place] y pizza.  Alimentos fritos y Lexicographergrasos.  Evite acostarse durante 3 horas antes de irse a dormir o antes de tomar una siesta.  Haga comidas pequeas durante Glass blower/designerel da en lugar de 3 comidas abundantes.  Use ropas sueltas. No use nada apretado alrededor de la cintura que cause presin en el estmago.  Levante (eleve) la cabecera de la cama 6 a 8 pulgadas (15 a 20 cm) con bloques de madera. Usar almohadas extra no ayuda.  Solo tome medicamentos que se pueden comprar sin receta o recetados para el dolor, Dentistmalestar o fiebre, como le indica el mdico.  No tome aspirina, ibuprofeno ni antiinflamatorios no esteroides. SOLICITE ATENCIN MDICA DE Engelhard CorporationNMEDIATO SI:   Goldman SachsSiente dolor en los brazos, el cuello, la [place]mandbula, los dientes o la espalda.  El  dolor aumenta o cambia la intensidad o la durancin.  Tiene nuseas, vmitos o sudoracin(diaforesis).  Siente falta de aire o dolor en el pecho, o se desmaya.  Vomita y el vmito tiene [place], es de color [place], [place], negro o es similar a la borra del caf o tiene [place].  Las heces son rojas, sanguinolentas o negras. Estos sntomas pueden ser signos de [address], como enfermedades cardacas, hemorragias gstrias o sangrado esofgico.  ASEGRESE DE QUE:   Comprende estas instrucciones.  Controlar su enfermedad.  Solicitar ayuda de inmediato si no mejora o si empeora. Document Released: 10/22/2004 Document Revised: 04/06/2011 [facility] Patient Information 2015 [place], [place]. This information is not intended to replace advice given to you by your health care provider. Make sure you discuss any questions you have with your health care provider.

## 2013-11-02 NOTE — Progress Notes (Signed)
HISTORY OF PRESENT ILLNESS:  Katherine [name] is a 38 y.o. female who is sent by her primary care physician at the outpatient medical clinic regarding abdominal complaints. The patient is accompanied [facility] [place] [facility] Patient is a [place] native. She has 5 children. She reports a one-year history of "inflamed sensation" around the mid abdomen. This occurs after meals. She describes postprandial fullness. As well reflux symptoms including regurgitation and pyrosis. Her weight fluctuates. No vomiting. Bowel habits are regular. She was placed on PPI previously which helped. She is no longer on PPI as the prescription has run out. She also underwent abdominal ultrasound which was unremarkable (March 2015). No gallstones. Blood work from 07/31/2013 revealed mild anemia with a tendency toward iron deficiency. She continues to menstruate. She denies GI bleeding.  REVIEW OF SYSTEMS:  All non-GI ROS negative except for  Past Medical History  Diagnosis Date  . Depression     postpartum depression  . Anemia   . Splenomegaly     Past Surgical History  Procedure Laterality Date  . No past surgeries      Social History Katherine [name]  reports that she has never smoked. She has never used smokeless tobacco. She reports that she does not drink alcohol or use illicit drugs.  family history includes Diabetes in her father; Kidney disease in her brother.  No Known Allergies     PHYSICAL EXAMINATION: Vital signs: BP 90/60  Pulse 68  Ht 5\' 3"  (1.6 m)  Wt 203 lb 12.8 oz (92.443 kg)  BMI 36.11 kg/m2  LMP 10/12/2013  Breastfeeding? Yes  Constitutional: Obese, but otherwise generally well-appearing, no acute distress Psychiatric: alert and oriented x3, cooperative Eyes: extraocular movements intact, anicteric, conjunctiva pink Mouth: oral pharynx moist, no lesions Neck: supple no lymphadenopathy Cardiovascular: heart regular rate and rhythm, no murmur Lungs: clear  to auscultation bilaterally Abdomen: soft, obese, nontender, nondistended, no obvious ascites, no peritoneal signs, normal bowel sounds, no organomegaly Rectal: Omitted Extremities: no lower extremity edema bilaterally Skin: tattoo on visible extremities (left forearm) Neuro: No focal deficits. No asterixis.    ASSESSMENT:  #1. GERD. Most likely explanation for constellation of symptoms #2. Morbid obesity   PLAN:  #1. Reflux precautions with attention to weight loss #2. Prescribe omeprazole 20 mg daily #3. Diagnostic EGD.The nature of the procedure, as well as the risks, benefits, and alternatives were carefully and thoroughly reviewed with the patient. Ample time for discussion and questions allowed. The patient understood, was satisfied, and agreed to proceed. Instructed to have interpreter available #4. Ongoing general medical care with PCP

## 2013-11-27 ENCOUNTER — Encounter: Payer: Self-pay | Admitting: Internal Medicine

## 2013-12-06 ENCOUNTER — Ambulatory Visit (AMBULATORY_SURGERY_CENTER): Payer: Self-pay | Admitting: Internal Medicine

## 2013-12-06 ENCOUNTER — Encounter: Payer: Self-pay | Admitting: Internal Medicine

## 2013-12-06 VITALS — BP 117/69 | HR 57 | Temp 96.9°F | Resp 28 | Ht 63.0 in | Wt 203.0 lb

## 2013-12-06 DIAGNOSIS — K219 Gastro-esophageal reflux disease without esophagitis: Secondary | ICD-10-CM

## 2013-12-06 MED ORDER — SODIUM CHLORIDE 0.9 % IV SOLN: 500.0000 mL | INTRAVENOUS | Status: DC

## 2013-12-06 NOTE — Op Note (Signed)
[facility] [address] [place], [id number]   ENDOSCOPY PROCEDURE REPORT  PATIENT: Katherine [name], Katherine [name]  MR#: [id number] BIRTHDATE: May 15, 1975 , 38  yrs. old GENDER: female ENDOSCOPIST: Roxy CedarJohn N Bessye Stith Jr, MD REFERRED BY:  Shella Maximeepak Avandi, M.D. PROCEDURE DATE:  12/06/2013 PROCEDURE:  EGD, diagnostic ASA CLASS:     Class I INDICATIONS:  epigastric pain and history of esophageal reflux. NOTE: Symptoms have improved on PPI MEDICATIONS: Monitored anesthesia care and Propofol 200 mg IV TOPICAL ANESTHETIC: none  DESCRIPTION OF PROCEDURE: After the risks benefits and alternatives of the procedure were thoroughly explained, informed consent was obtained.  The LB [id number] endoscope was introduced through the mouth and advanced to the second portion of the duodenum , Without limitations.  The instrument was slowly withdrawn as the mucosa was fully examined.      EXAM: The esophagus and gastroesophageal junction were completely normal in appearance.  The stomach was entered and closely examined.The antrum, angularis, and lesser curvature were well visualized, including a retroflexed view of the cardia and fundus. The stomach wall was normally distensable.  The scope passed easily through the pylorus into the duodenum.  Retroflexed views revealed no abnormalities.     The scope was then withdrawn from the patient and the procedure completed.  COMPLICATIONS: There were no immediate complications.  ENDOSCOPIC IMPRESSION: 1. GERD 2. Normal EGD  RECOMMENDATIONS: 1.  Anti-reflux regimen to be followed 2.  Continue omeprazole once daily 3. Return to the care of your primary physician  REPEAT EXAM:  eSigned:  Roxy CedarJohn N Johney Perotti Jr, MD 12/06/2013 4:25 PM    CC:The Patient  ; Shella Maximeepak Avandi, MD  PATIENT NAME:  Katherine [name], Katherine [name] MR#: [id number]

## 2013-12-06 NOTE — Patient Instructions (Addendum)
YOU HAD AN ENDOSCOPIC PROCEDURE TODAY AT THE [facility]: Refer to the procedure report that was given to you for any specific questions about what was found during the examination.  If the procedure report does not answer your questions, please call your gastroenterologist to clarify.  If you requested that your care partner not be given the details of your procedure findings, then the procedure report has been included in a sealed envelope for you to review at your convenience later.  YOU SHOULD EXPECT: Some feelings of bloating in the abdomen. Passage of more gas than usual.  Walking can help get rid of the air that was put into your GI tract during the procedure and reduce the bloating. If you had a lower endoscopy (such as a colonoscopy or flexible sigmoidoscopy) you may notice spotting of blood in your stool or on the toilet paper. If you underwent a bowel prep for your procedure, then you may not have a normal bowel movement for a few days.  DIET: Your first meal following the procedure should be a light meal and then it is ok to progress to your normal diet.  A half-sandwich or bowl of soup is an example of a good first meal.  Heavy or fried foods are harder to digest and may make you feel nauseous or bloated.  Likewise meals heavy in dairy and vegetables can cause extra gas to form and this can also increase the bloating.  Drink plenty of fluids but you should avoid alcoholic beverages for 24 hours.  ACTIVITY: Your care partner should take you home directly after the procedure.  You should plan to take it easy, moving slowly for the rest of the day.  You can resume normal activity the day after the procedure however you should NOT DRIVE or use heavy machinery for 24 hours (because of the sedation medicines used during the test).    SYMPTOMS TO REPORT IMMEDIATELY: A gastroenterologist can be reached at any hour.  During normal business hours, 8:30 AM to 5:00 PM Monday through Friday,  call [phone].  After hours and on weekends, please call the GI answering service at [phone] who will take a message and have the physician on call contact you.     Following upper endoscopy (EGD)  Vomiting of blood or coffee ground material  New chest pain or pain under the shoulder blades  Painful or persistently difficult swallowing  New shortness of breath  Fever of 100F or higher  Black, tarry-looking stools  FOLLOW UP: If any biopsies were taken you will be contacted by phone or by letter within the next 1-3 weeks.  Call your gastroenterologist if you have not heard about the biopsies in 3 weeks.  Our staff will call the home number listed on your records the next business day following your procedure to check on you and address any questions or concerns that you may have at that time regarding the information given to you following your procedure. This is a courtesy call and so if there is no answer at the home number and we have not heard from you through the emergency physician on call, we will assume that you have returned to your regular daily activities without incident.     SIGNATURES/CONFIDENTIALITY: You and/or your care partner have signed paperwork which will be entered into your electronic medical record.  These signatures attest to the fact that that the information above on your After Visit Summary has been reviewed and  is understood.  Full responsibility of the confidentiality of this discharge information lies with you and/or your care-partner.   Instructions post endosocopy given verbally and in writing, with interpreter present.  Pt. encouraged to ask questions with interpreter there to translate.

## 2013-12-06 NOTE — Progress Notes (Signed)
Pt awake alert and oriented X3, pleased with MAC, Report to RN

## 2013-12-07 ENCOUNTER — Telehealth: Payer: Self-pay | Admitting: *Deleted

## 2013-12-07 NOTE — Telephone Encounter (Signed)
  Follow up Call-  Call back number 12/06/2013  Post procedure Call Back phone  # [phone]  Permission to leave phone message Yes     Patient questions:  Do you have a fever, pain , or abdominal swelling? No. Pain Score  0 *  Have you tolerated food without any problems? Yes.    Have you been able to return to your normal activities? Yes.    Do you have any questions about your discharge instructions: Diet   No. Medications  No. Follow up visit  No.  Do you have questions or concerns about your Care? No.  Actions: * If pain score is 4 or above: No action needed, pain <4.

## 2014-03-19 ENCOUNTER — Ambulatory Visit: Payer: Self-pay | Admitting: Internal Medicine

## 2014-06-07 ENCOUNTER — Ambulatory Visit: Payer: Self-pay | Admitting: Internal Medicine

## 2014-06-08 ENCOUNTER — Ambulatory Visit: Payer: Self-pay | Admitting: Internal Medicine

## 2014-06-15 ENCOUNTER — Ambulatory Visit: Payer: Self-pay | Admitting: Internal Medicine

## 2014-06-20 ENCOUNTER — Emergency Department (HOSPITAL_COMMUNITY)
Admission: EM | Admit: 2014-06-20 | Discharge: 2014-06-21 | Disposition: A | Discharge status: Home / Self Care | Payer: Self-pay | Attending: Emergency Medicine | Admitting: Emergency Medicine

## 2014-06-20 DIAGNOSIS — R42 Dizziness and giddiness: Secondary | ICD-10-CM | POA: Insufficient documentation

## 2014-06-20 DIAGNOSIS — R55 Syncope and collapse: Secondary | ICD-10-CM | POA: Insufficient documentation

## 2014-06-20 DIAGNOSIS — Z862 Personal history of diseases of the blood and blood-forming organs and certain disorders involving the immune mechanism: Secondary | ICD-10-CM | POA: Insufficient documentation

## 2014-06-20 DIAGNOSIS — Z3202 Encounter for pregnancy test, result negative: Secondary | ICD-10-CM | POA: Insufficient documentation

## 2014-06-20 DIAGNOSIS — Z8659 Personal history of other mental and behavioral disorders: Secondary | ICD-10-CM | POA: Insufficient documentation

## 2014-06-20 DIAGNOSIS — R5383 Other fatigue: Secondary | ICD-10-CM | POA: Insufficient documentation

## 2014-06-20 LAB — POC URINE PREG, ED: Preg Test, Ur: NEGATIVE

## 2014-06-20 LAB — BASIC METABOLIC PANEL
Anion gap: 10 (ref 5–15)
BUN: 16 mg/dL (ref 6–20)
CALCIUM: 8.8 mg/dL — AB (ref 8.9–10.3)
CO2: 22 mmol/L (ref 22–32)
CREATININE: 0.69 mg/dL (ref 0.44–1.00)
Chloride: 107 mmol/L (ref 101–111)
GFR calc non Af Amer: >60 mL/min (ref >60)
Glucose, Bld: 107 mg/dL — ABNORMAL HIGH (ref 65–99)
Potassium: 3.2 mmol/L — ABNORMAL LOW (ref 3.5–5.1)
SODIUM: 139 mmol/L (ref 135–145)

## 2014-06-20 LAB — CBC
HCT: 34.1 % — ABNORMAL LOW (ref 36.0–46.0)
Hemoglobin: 11.3 g/dL — ABNORMAL LOW (ref 12.0–15.0)
MCH: 27.6 pg (ref 26.0–34.0)
MCHC: 33.1 g/dL (ref 30.0–36.0)
MCV: 83.4 fL (ref 78.0–100.0)
PLATELETS: 266 10*3/uL (ref 150–400)
RBC: 4.09 MIL/uL (ref 3.87–5.11)
RDW: 12.9 % (ref 11.5–15.5)
WBC: 14.6 10*3/uL — AB (ref 4.0–10.5)

## 2014-06-20 LAB — CBG MONITORING, ED: Glucose-Capillary: 97 mg/dL (ref 65–99)

## 2014-06-20 MED ORDER — SODIUM CHLORIDE 0.9 % IV BOLUS (SEPSIS)
1000.0000 mL | Freq: Once | INTRAVENOUS | Status: AC
  Administered 2014-06-20: 1000 mL via INTRAVENOUS

## 2014-06-20 NOTE — ED Provider Notes (Signed)
CSN: [id number]     Arrival date & time 06/20/14  2108 History   First MD Initiated Contact with Patient 06/20/14 2159     Chief Complaint  Patient presents with  . Altered Mental Status  . Fatigue  . Dizziness     (Consider location/radiation/quality/duration/timing/severity/associated sxs/prior Treatment) HPI Comments: Patient presents to the emergency department with chief complaint of syncopal episode. A Spanish interpreter was used in obtaining the history. This clarified several points when compared to the nursing notes and EMS report.  Patient states that she was out on the back porch this evening when she became dizzy and lightheaded. She states that she then passed out. She was found by her daughter, and was still not fully alert. EMS was then called. Patient states that she is feeling well now. She states that she recently finished her menstrual cycle. She denies any blood in her stools. She states that prior to passing out she became dizzy and had some shortness of breath. She denies any chest pain. Currently she is symptom-free.  The history is provided by the patient. The history is limited by a language barrier. A language interpreter was used.    Past Medical History  Diagnosis Date  . Depression     postpartum depression  . Anemia   . Splenomegaly    Past Surgical History  Procedure Laterality Date  . No past surgeries     Family History  Problem Relation Age of Onset  . Diabetes Father   . Kidney disease Brother    History  Substance Use Topics  . Smoking status: Never Smoker   . Smokeless tobacco: Never Used  . Alcohol Use: No   OB History    Gravida Para Term Preterm AB TAB SAB Ectopic Multiple Living   Review of Systems  Constitutional: Negative for fever and chills.  Respiratory: Negative for shortness of breath.   Cardiovascular: Negative for chest pain.  Gastrointestinal: Negative for nausea, vomiting, diarrhea and  constipation.  Genitourinary: Negative for dysuria.  Neurological: Positive for syncope.  All other systems reviewed and are negative.     Allergies  Review of patient's allergies indicates no known allergies.  Home Medications   Prior to Admission medications   Medication Sig Start Date End Date Taking? Authorizing Provider  ibuprofen (ADVIL,MOTRIN) 200 MG tablet Take 400 mg by mouth every 6 (six) hours as needed.   Yes Historical Provider, MD  OVER THE COUNTER MEDICATION Take 1 capsule by mouth 3 (three) times daily. herba-life supplement. Also takes the shakes and drinks the tea   Yes Historical Provider, MD  omeprazole (PRILOSEC) 20 MG capsule Take 1 capsule (20 mg total) by mouth daily. Patient not taking: Reported on 06/20/2014 11/02/13   Hilarie Fredrickson, MD   BP 128/79 mmHg  Pulse 73  Temp(Src) 98.5 F (36.9 C) (Oral)  Resp 20  SpO2 98% Physical Exam  Constitutional: She is oriented to person, place, and time. She appears well-developed and well-nourished.  HENT:  Head: Normocephalic and atraumatic.  Eyes: Conjunctivae and EOM are normal. Pupils are equal, round, and reactive to light.  Neck: Normal range of motion. Neck supple.  Cardiovascular: Normal rate and regular rhythm.  Exam reveals no gallop and no friction rub.   No murmur heard. Pulmonary/Chest: Effort normal and breath sounds normal. No respiratory distress. She has no wheezes. She has no rales. She exhibits no tenderness.  Abdominal: Soft. Bowel sounds are normal. She exhibits no distension and no mass. There is no tenderness. There is no rebound and no guarding.  Musculoskeletal: Normal range of motion. She exhibits no edema or tenderness.  Neurological: She is alert and oriented to person, place, and time.  Skin: Skin is warm and dry.  Psychiatric: She has a normal mood and affect. Her behavior is normal. Judgment and thought content normal.  Nursing note and vitals reviewed.   ED Course  Procedures  (including critical care time) Results for orders placed or performed during the hospital encounter of 06/20/14  CBC  Result Value Ref Range   WBC 14.6 (H) 4.0 - 10.5 K/uL   RBC 4.09 3.87 - 5.11 MIL/uL   Hemoglobin 11.3 (L) 12.0 - 15.0 g/dL   HCT 16.134.1 (L) 09.636.0 - 04.546.0 %   MCV 83.4 78.0 - 100.0 fL   MCH 27.6 26.0 - 34.0 pg   MCHC 33.1 30.0 - 36.0 g/dL   RDW 40.912.9 81.111.5 - 91.415.5 %   Platelets 266 150 - 400 K/uL  Basic metabolic panel  Result Value Ref Range   Sodium 139 135 - 145 mmol/L   Potassium 3.2 (L) 3.5 - 5.1 mmol/L   Chloride 107 101 - 111 mmol/L   CO2 22 22 - 32 mmol/L   Glucose, Bld 107 (H) 65 - 99 mg/dL   BUN 16 6 - 20 mg/dL   Creatinine, Ser 7.820.69 0.44 - 1.00 mg/dL   Calcium 8.8 (L) 8.9 - 10.3 mg/dL   GFR calc non Af Amer >60 >60 mL/min   GFR calc Af Amer >60 >60 mL/min   Anion gap 10 5 - 15  CBG, ED  Result Value Ref Range   Glucose-Capillary 97 65 - 99 mg/dL  POC Urine Preg, ED (if patient is pre-menopausal female)  not [facility]  Result Value Ref Range   Preg Test, Ur NEGATIVE NEGATIVE   No results found.    EKG Interpretation None      MDM   Final diagnoses:  Syncope, unspecified syncope type   Patient with syncopal episode. She became dizzy and lightheaded prior to passing out. Will check labs, EKG, and orthostatics. Will give fluids. Patient recently finished her menstrual cycle, this could be a contributing factor. Will reassess.  Patient states that she is feeling well now.  Ambulates without difficulty or dizziness. [facility] syncope rules. Patient has been observed for several hours. She is still feeling well. Will discharge to home. Return precautions given. Patient understands and agrees with the plan.    Roxy Horsemanobert Roczen Waymire, PA-C 06/21/14 [phone]  Mancel BaleElliott Wentz, MD 06/22/14 [phone]

## 2014-06-20 NOTE — ED Notes (Signed)
Patient arrives by EMS with complaints of altered LOC.  Daughter found patient on the back porch, "not acting right".  Language barrier. Alert, eyes open,tracking, does not speak any [place].  PIV intact.  Per EMS, head pain and dizziness per patient, though poor communication.

## 2014-06-20 NOTE — ED Notes (Signed)
Patient given sprite to drink. Patient did not have trouble drinking the sprite. Patient is not feeling nausea's.

## 2014-06-20 NOTE — ED Notes (Signed)
Pt able to ambulate without difficulty or assistance. Pt did not experience dizziness or lightheadedness.

## 2014-06-20 NOTE — ED Notes (Signed)
Bed: WA14 Expected date:  Expected time:  Means of arrival:  Comments: EMS altered LOC 

## 2014-06-21 NOTE — Discharge Instructions (Signed)
Presíncope °(Near-Syncope) °El presíncope (comúnmente llamado "casi desmayo") es un estado de debilidad repentina, mareos o sensación de que la persona va a desmayarse. Durante un episodio de presíncope, también puede tener la piel pálida, visión en túnel o ganas de vomitar (náuseas). El presíncope puede ocurrir al levantarse de una silla o al permanecer de pie durante mucho tiempo. La causa es una disminución súbita del flujo de sangre al cerebro. Esta disminución puede ser el resultado de varias causas o disparadores, la mayoría de las cuales no son graves. Sin embargo, debido a que a veces el presíncope puede ser un signo de una afección grave, es necesaria una evaluación médica. Generalmente la causa específica no puede determinarse. °INSTRUCCIONES PARA EL CUIDADO EN EL HOGAR  °Controle su afección para ver si hay cambios. Las siguientes indicaciones ayudarán a aliviar cualquier molestia que pueda sentir: °· Pídale a alguien que se quede con usted hasta que se sienta estable. °· Recuéstese inmediatamente y eleve las piernas si siente que va a desmayarse. Respire profundamente y de manera continua. Espere hasta que los síntomas hayan desaparecido. La mayor parte de los episodios dura unos pocos minutos. Podrá sentirse cansado por varias horas. °· Beba suficiente líquido para mantener la orina clara o de color amarillo pálido. °· Si toma medicamentos para la presión arterial o para el corazón, levántese lentamente si está sentado o acostado. Tómese algunos minutos para permanecer sentado y luego párese. Esto puede reducir los mareos. °· Concurra a las consultas de control con su médico según las indicaciones. °SOLICITE ATENCIÓN MÉDICA DE INMEDIATO SI:  °· Sufre un dolor intenso de cabeza. °· Siente un dolor intenso inusual en el pecho, el abdomen o la espalda. °· Tiene un sangrado por la boca o el recto, o la materia fecal es de color negro o aspecto alquitranado. °· Siente latidos irregulares o muy  rápidos. °· Sufre episodios de desmayo repetidos o temblores como sacudidas durante un episodio. °· Se desmaya mientras se encuentra sentado o acostado. °· Se siente confundido. °· Tiene problemas para caminar. °· Siente debilidad intensa. °· Tiene problemas de visión. °ASEGÚRESE DE QUE:  °· Comprende estas instrucciones. °· Controlará su afección. °· Recibirá ayuda de inmediato si no mejora o si empeora. °Document Released: 01/12/2005 Document Revised: 01/17/2013 °ExitCare® Patient Information ©2015 [employer]. This information is not intended to replace advice given to you by your health care provider. Make sure you discuss any questions you have with your health care provider. ° °

## 2014-11-16 ENCOUNTER — Inpatient Hospital Stay (HOSPITAL_COMMUNITY)
Admission: AD | Admit: 2014-11-16 | Discharge: 2014-11-16 | Disposition: A | Discharge status: Home / Self Care | Payer: Self-pay | Source: Ambulatory Visit | Attending: Obstetrics and Gynecology | Admitting: Obstetrics and Gynecology

## 2014-11-16 ENCOUNTER — Inpatient Hospital Stay (HOSPITAL_COMMUNITY): Payer: Self-pay

## 2014-11-16 ENCOUNTER — Encounter (HOSPITAL_COMMUNITY): Payer: Self-pay | Admitting: *Deleted

## 2014-11-16 DIAGNOSIS — Z3A01 Less than 8 weeks gestation of pregnancy: Secondary | ICD-10-CM | POA: Insufficient documentation

## 2014-11-16 DIAGNOSIS — O26899 Other specified pregnancy related conditions, unspecified trimester: Secondary | ICD-10-CM

## 2014-11-16 DIAGNOSIS — R109 Unspecified abdominal pain: Secondary | ICD-10-CM | POA: Insufficient documentation

## 2014-11-16 DIAGNOSIS — R110 Nausea: Secondary | ICD-10-CM | POA: Insufficient documentation

## 2014-11-16 DIAGNOSIS — O26891 Other specified pregnancy related conditions, first trimester: Secondary | ICD-10-CM | POA: Insufficient documentation

## 2014-11-16 DIAGNOSIS — O9989 Other specified diseases and conditions complicating pregnancy, childbirth and the puerperium: Secondary | ICD-10-CM

## 2014-11-16 LAB — URINALYSIS, ROUTINE W REFLEX MICROSCOPIC
BILIRUBIN URINE: NEGATIVE
Glucose, UA: NEGATIVE mg/dL
KETONES UR: NEGATIVE mg/dL
Nitrite: NEGATIVE
PROTEIN: NEGATIVE mg/dL
Specific Gravity, Urine: 1.01 (ref 1.005–1.030)
Urobilinogen, UA: 0.2 mg/dL (ref 0.0–1.0)
pH: 6.5 (ref 5.0–8.0)

## 2014-11-16 LAB — POCT PREGNANCY, URINE: Preg Test, Ur: POSITIVE — AB

## 2014-11-16 LAB — CBC
HCT: 34.8 % — ABNORMAL LOW (ref 36.0–46.0)
HEMOGLOBIN: 11.7 g/dL — AB (ref 12.0–15.0)
MCH: 27.9 pg (ref 26.0–34.0)
MCHC: 33.6 g/dL (ref 30.0–36.0)
MCV: 82.9 fL (ref 78.0–100.0)
Platelets: 271 10*3/uL (ref 150–400)
RBC: 4.2 MIL/uL (ref 3.87–5.11)
RDW: 12.9 % (ref 11.5–15.5)
WBC: 14.9 10*3/uL — ABNORMAL HIGH (ref 4.0–10.5)

## 2014-11-16 LAB — WET PREP, GENITAL
Clue Cells Wet Prep HPF POC: NONE SEEN
Trich, Wet Prep: NONE SEEN
Yeast Wet Prep HPF POC: NONE SEEN

## 2014-11-16 LAB — URINE MICROSCOPIC-ADD ON

## 2014-11-16 LAB — HCG, QUANTITATIVE, PREGNANCY: hCG, Beta Chain, Quant, S: 790 m[IU]/mL — ABNORMAL HIGH (ref <5)

## 2014-11-16 MED ORDER — PROMETHAZINE HCL 25 MG PO TABS: 25.0000 mg | ORAL_TABLET | Freq: Four times a day (QID) | ORAL | Status: DC | PRN

## 2014-11-16 MED ORDER — MECLIZINE HCL 25 MG PO TABS: 25.0000 mg | ORAL_TABLET | Freq: Three times a day (TID) | ORAL | Status: DC | PRN

## 2014-11-16 NOTE — MAU Note (Signed)
Pt has been having abd and nausea x 1 week. Took HPT and it was positive.

## 2014-11-16 NOTE — MAU Provider Note (Signed)
History     CSN: [id number]  Arrival date and time: 11/16/14 1504   First Provider Initiated Contact with Patient 11/16/14 1750      Chief Complaint  Patient presents with  . Abdominal Pain  . Nausea   HPI Pt is [redacted] weeks pregnant [id number] and presents with positive home pregnancy test(last week) and cramping for 1 day and back pain.  Pt denies spotting/bleeding. Pt denies constipation, diarrhea or UTI sx.   Past Medical History  Diagnosis Date  . Depression     postpartum depression  . Anemia   . Splenomegaly     Past Surgical History  Procedure Laterality Date  . No past surgeries      Family History  Problem Relation Age of Onset  . Diabetes Father   . Kidney disease Brother     Social History  Substance Use Topics  . Smoking status: Never Smoker   . Smokeless tobacco: Never Used  . Alcohol Use: No    Allergies: No Known Allergies  Prescriptions prior to admission  Medication Sig Dispense Refill Last Dose  . Prenatal Vit-Fe Fumarate-FA (PRENATAL MULTIVITAMIN) TABS tablet Take 1 tablet by mouth daily at 12 noon.   11/15/2014 at Unknown time  . [DISCONTINUED] omeprazole (PRILOSEC) 20 MG capsule Take 1 capsule (20 mg total) by mouth daily. (Patient not taking: Reported on 06/20/2014) 30 capsule 6 Not Taking at Unknown time    Review of Systems  Constitutional: Negative for fever and chills.  Gastrointestinal: Positive for nausea and abdominal pain. Negative for vomiting, diarrhea and constipation.  Genitourinary: Negative for dysuria and urgency.   Physical Exam   Blood pressure 130/68, pulse 80, temperature 98.4 F (36.9 C), resp. rate 18, height  (1.575 m), weight 191 lb 12.8 oz (87 kg), last menstrual period 10/11/2014, currently breastfeeding.  Physical Exam  Nursing note and vitals reviewed. Constitutional: She is oriented to person, place, and time. She appears well-developed and well-nourished. No distress.  HENT:  Head: Normocephalic.   Eyes: Pupils are equal, round, and reactive to light.  Neck: Normal range of motion. Neck supple.  Cardiovascular: Normal rate.   Respiratory: Effort normal.  GI: Soft.  Genitourinary:  Small amount of white frothy discharge in vault; cervix clean, NT; uterus NSSC NT; adnexa without palpable enlargement or tenderness  Musculoskeletal: Normal range of motion.  Neurological: She is alert and oriented to person, place, and time.  Skin: Skin is warm and dry.  Psychiatric: She has a normal mood and affect.    MAU Course  Procedures Results for orders placed or performed during the hospital encounter of 11/16/14 (from the past 24 hour(s))  Urinalysis, Routine w reflex microscopic (not at [facility])     Status: Abnormal   Collection Time: 11/16/14  4:16 PM  Result Value Ref Range   Color, Urine YELLOW YELLOW   APPearance CLEAR CLEAR   Specific Gravity, Urine 1.010 1.005 - 1.030   pH 6.5 5.0 - 8.0   Glucose, UA NEGATIVE NEGATIVE mg/dL   Hgb urine dipstick TRACE (A) NEGATIVE   Bilirubin Urine NEGATIVE NEGATIVE   Ketones, ur NEGATIVE NEGATIVE mg/dL   Protein, ur NEGATIVE NEGATIVE mg/dL   Urobilinogen, UA 0.2 0.0 - 1.0 mg/dL   Nitrite NEGATIVE NEGATIVE   Leukocytes, UA LARGE (A) NEGATIVE  Urine microscopic-add on     Status: Abnormal   Collection Time: 11/16/14  4:16 PM  Result Value Ref Range   Squamous Epithelial / LPF FEW (A) RARE  WBC, UA 3-6 <3 WBC/hpf   RBC / HPF 0-2 <3 RBC/hpf   Bacteria, UA RARE RARE  Pregnancy, urine POC     Status: Abnormal   Collection Time: 11/16/14  4:20 PM  Result Value Ref Range   Preg Test, Ur POSITIVE (A) NEGATIVE  Wet prep, genital     Status: Abnormal   Collection Time: 11/16/14  6:06 PM  Result Value Ref Range   Yeast Wet Prep HPF POC NONE SEEN NONE SEEN   Trich, Wet Prep NONE SEEN NONE SEEN   Clue Cells Wet Prep HPF POC NONE SEEN NONE SEEN   WBC, Wet Prep HPF POC MODERATE (A) NONE SEEN  CBC     Status: Abnormal   Collection Time: 11/16/14   6:14 PM  Result Value Ref Range   WBC 14.9 (H) 4.0 - 10.5 K/uL   RBC 4.20 3.87 - 5.11 MIL/uL   Hemoglobin 11.7 (L) 12.0 - 15.0 g/dL   HCT 16.1 (L) 09.6 - 04.5 %   MCV 82.9 78.0 - 100.0 fL   MCH 27.9 26.0 - 34.0 pg   MCHC 33.6 30.0 - 36.0 g/dL   RDW 40.9 81.1 - 91.4 %   Platelets 271 150 - 400 K/uL  hCG, quantitative, pregnancy     Status: Abnormal   Collection Time: 11/16/14  6:15 PM  Result Value Ref Range   hCG, Beta Chain, Quant, S 790 (H) <5 mIU/mL   US Ob Comp Less 14 Wks  11/16/2014  CLINICAL DATA:  Abdominal pain 1 week. Quantitative beta HCG 790. Estimated gestational age [redacted] weeks 1 day per LMP. EXAM: OBSTETRIC <14 WK [place] AND TRANSVAGINAL OB US TECHNIQUE: Both transabdominal and transvaginal ultrasound examinations were performed for complete evaluation of the gestation as well as the maternal uterus, adnexal regions, and pelvic cul-de-sac. Transvaginal technique was performed to assess early pregnancy. COMPARISON:  None. FINDINGS: Intrauterine gestational sac: Endometrium mildly thickened measuring 15 mm with oval hypoechoic focus over the fundal endometrium which may represent an early gestational sac. Yolk sac:  Not visualized. Embryo:  Not visualized. Cardiac Activity: Not visualized. Heart Rate: Not visualized. MSD: 2.7  mm   5 w   0  d Maternal uterus/adnexae: Ovaries are normal in size shape and position with normal color flow bilaterally. Small corpus luteal cyst over the left ovary. No free pelvic fluid. No focal adnexal/tubal abnormality. IMPRESSION: Findings suggesting a small intrauterine gestational sac with mean sac diameter compatible with estimated gestational age [redacted] weeks 0 days. No yolk sac or embryo identified. Findings may represent a normal early pregnancy as recommend correlation with serial quantitative beta HCG and follow-up ultrasound as clinically indicated. Electronically Signed   By: Elberta Fortis M.D.   On: 11/16/2014 19:54   US Ob Transvaginal  11/16/2014   CLINICAL DATA:  Abdominal pain 1 week. Quantitative beta HCG 790. Estimated gestational age [redacted] weeks 1 day per LMP. EXAM: OBSTETRIC <14 WK [place] AND TRANSVAGINAL OB US TECHNIQUE: Both transabdominal and transvaginal ultrasound examinations were performed for complete evaluation of the gestation as well as the maternal uterus, adnexal regions, and pelvic cul-de-sac. Transvaginal technique was performed to assess early pregnancy. COMPARISON:  None. FINDINGS: Intrauterine gestational sac: Endometrium mildly thickened measuring 15 mm with oval hypoechoic focus over the fundal endometrium which may represent an early gestational sac. Yolk sac:  Not visualized. Embryo:  Not visualized. Cardiac Activity: Not visualized. Heart Rate: Not visualized. MSD: 2.7  mm   5 w   0  d Maternal uterus/adnexae: Ovaries are normal in size shape and position with normal color flow bilaterally. Small corpus luteal cyst over the left ovary. No free pelvic fluid. No focal adnexal/tubal abnormality. IMPRESSION: Findings suggesting a small intrauterine gestational sac with mean sac diameter compatible with estimated gestational age [redacted] weeks 0 days. No yolk sac or embryo identified. Findings may represent a normal early pregnancy as recommend correlation with serial quantitative beta HCG and follow-up ultrasound as clinically indicated. Electronically Signed   By: Elberta Fortisaniel  Boyle M.D.   On: 11/16/2014 19:54   Assessment and Plan  Pregnancy of unknown location- repeat HCG 48 hours Abdominal cramping- right CLC F/u for increase in pain or bleeding  Aydrian Halpin 11/16/2014, 8:22 PM

## 2014-11-16 NOTE — Discharge Instructions (Signed)

## 2014-11-17 LAB — HIV ANTIBODY (ROUTINE TESTING W REFLEX): HIV SCREEN 4TH GENERATION: NONREACTIVE

## 2014-11-19 LAB — GC/CHLAMYDIA PROBE AMP (~~LOC~~) NOT AT ARMC
Chlamydia: NEGATIVE
Neisseria Gonorrhea: NEGATIVE

## 2014-12-03 ENCOUNTER — Inpatient Hospital Stay (HOSPITAL_COMMUNITY)
Admission: AD | Admit: 2014-12-03 | Discharge: 2014-12-03 | Disposition: A | Discharge status: Home / Self Care | Payer: Self-pay | Source: Ambulatory Visit | Attending: Family Medicine | Admitting: Family Medicine

## 2014-12-03 ENCOUNTER — Inpatient Hospital Stay (HOSPITAL_COMMUNITY): Payer: Self-pay

## 2014-12-03 ENCOUNTER — Encounter (HOSPITAL_COMMUNITY): Payer: Self-pay | Admitting: Student

## 2014-12-03 DIAGNOSIS — O418X10 Other specified disorders of amniotic fluid and membranes, first trimester, not applicable or unspecified: Secondary | ICD-10-CM

## 2014-12-03 DIAGNOSIS — O3680X0 Pregnancy with inconclusive fetal viability, not applicable or unspecified: Secondary | ICD-10-CM

## 2014-12-03 DIAGNOSIS — Z3491 Encounter for supervision of normal pregnancy, unspecified, first trimester: Secondary | ICD-10-CM

## 2014-12-03 DIAGNOSIS — O468X1 Other antepartum hemorrhage, first trimester: Secondary | ICD-10-CM | POA: Insufficient documentation

## 2014-12-03 DIAGNOSIS — O43891 Other placental disorders, first trimester: Secondary | ICD-10-CM

## 2014-12-03 DIAGNOSIS — Z3A01 Less than 8 weeks gestation of pregnancy: Secondary | ICD-10-CM | POA: Insufficient documentation

## 2014-12-03 LAB — HCG, QUANTITATIVE, PREGNANCY: hCG, Beta Chain, Quant, S: 55869 m[IU]/mL — ABNORMAL HIGH (ref <5)

## 2014-12-03 NOTE — MAU Provider Note (Signed)
History   [id number]   Chief Complaint  Patient presents with  . Bloodwork     HPI Katherine [name] is a 39 y.o. female [phone] here for follow-up BHCG.  Upon review of the records patient was first seen on 10/21 for abdominal pain.   BHCG on that day was 790.  Ultrasound showed probable IUGS, no yolk sac.  GC/CT and wet prep were collected.  Results were negative.   Pt discharged home with instructions to return in 48 hrs for f/u BHCG.   Pt here today with no report of abdominal pain or vaginal bleeding.   All other systems negative. Pt did not return for f/u BHCG prior to today.   Spanish speaking patient; interpreter at bedside.   Patient's last menstrual period was 10/11/2014.  OB History  Gravida Para Term Preterm AB SAB TAB Ectopic Multiple Living  # Outcome Date GA Lbr Len/2nd Weight Sex Delivery Anes PTL Lv  6 Current           5 Term 10/31/12 [redacted]w[redacted]d 13:52 / 00:22 6 lb 15.6 oz (3.165 kg) F Vag-Spont EPI  Y     Comments: none  4 Term      Vag-Spont   Y  3 Term      Vag-Spont   Y  2 Term      Vag-Spont   Y  1 Term      Vag-Spont   Y      Past Medical History  Diagnosis Date  . Depression     postpartum depression  . Anemia   . Splenomegaly     Family History  Problem Relation Age of Onset  . Diabetes Father   . Kidney disease Brother     Social History   Social History  . Marital Status: Legally Separated    Spouse Name: N/A  . Number of Children: 5  . Years of Education: N/A   Social History Main Topics  . Smoking status: Never Smoker   . Smokeless tobacco: Never Used  . Alcohol Use: No  . Drug Use: No  . Sexual Activity: Yes   Other Topics Concern  . Not on file   Social History Narrative    No Known Allergies  No current facility-administered medications on file prior to encounter.   Current Outpatient Prescriptions on File Prior to Encounter  Medication Sig Dispense Refill  . meclizine (ANTIVERT) 25 MG tablet Take  1 tablet (25 mg total) by mouth 3 (three) times daily as needed for dizziness. 30 tablet 0  . Prenatal Vit-Fe Fumarate-FA (PRENATAL MULTIVITAMIN) TABS tablet Take 1 tablet by mouth daily at 12 noon.    . promethazine (PHENERGAN) 25 MG tablet Take 1 tablet (25 mg total) by mouth every 6 (six) hours as needed for nausea or vomiting. 30 tablet 0     Physical Exam   Filed Vitals:   12/03/14 1216  BP: 109/60  Pulse: 60  Temp: 98.2 F (36.8 C)  TempSrc: Oral  Resp: 18  Height:  (1.575 m)  Weight: 195 lb 4 oz (88.565 kg)    Physical Exam  Constitutional: She appears well-developed and well-nourished. No distress.  HENT:  Head: Normocephalic and atraumatic.  Cardiovascular: Normal rate.   Respiratory: Effort normal. No respiratory distress.  Skin: She is not diaphoretic.  Psychiatric: She has a normal mood and affect. Her behavior is normal. Judgment and thought content  normal.    MAU Course  Procedures Component     Latest Ref Rng 11/16/2014 12/03/2014  HCG, Beta Chain, Quant, S     <5 mIU/mL 790 (H) 55869 (H)   [place] Ob Transvaginal  12/03/2014  CLINICAL DATA:  Evaluate pregnancy of unknown anatomic location, beta HCG was 792 weeks ago into date is 55,869, first trimester pregnancy EXAM: TRANSVAGINAL OB ULTRASOUND TECHNIQUE: Transvaginal ultrasound was performed for complete evaluation of the gestation as well as the maternal uterus, adnexal regions, and pelvic cul-de-sac. COMPARISON:  11/16/14 FINDINGS: Intrauterine gestational sac: Visualized/normal in shape. Yolk sac:  Present Embryo:  Present Cardiac Activity: Present Heart Rate: 126 bpm CRL:   6  mm   6 w 2 d                  US EDC: 07/27/2015 Maternal uterus/adnexae: Small subchorionic hemorrhage measuring 34 x 12 x 34 mm. Ovaries are normal. Corpus luteum noted on the left ovary. No free fluid. IMPRESSION: Single live intrauterine gestation Electronically Signed   By: Esperanza Heiraymond  Rubner M.D.   On: 12/03/2014 15:00      MDM IUP with cardiac activity per today's ultrasound Denies abdominal pain or vaginal bleeding Plans on getting care with Dr. Gaynell FaceMarshall.   Assessment and Plan  39 y.o. G6P5005 at 7456w4d wks Pregnancy Follow-up BHCG 1. Subchorionic hematoma in first trimester   2. Pregnancy of unknown anatomic location   3. Normal IUP (intrauterine pregnancy) on prenatal ultrasound, first trimester    P: Discharge home Pregnancy verification letter Start prenatal care Discussed reasons to return to MAU Continue prenatal vitamins  Judeth HornErin Alyviah Crandle, NP 12/03/2014 12:37 PM

## 2014-12-03 NOTE — MAU Note (Signed)
Patient presents at [redacted] weeks gestation for labwork. Was seen on 11/16/14 and was to return in 48 hours for labwork due to pregnancy of unknown location. Has arrived today. Denies complications.

## 2014-12-03 NOTE — Discharge Instructions (Signed)
Primer trimestre de embarazo (First Trimester of Pregnancy) El primer trimestre de embarazo se extiende desde la semana1 hasta el final de la semana12 (mes1 al mes3). Durante este tiempo, el beb comenzar a desarrollarse dentro suyo. Entre la semana6 y la8, se forman los ojos y el rostro, y los latidos del corazn pueden verse en la ecografa. Al final de las 12semanas, todos los rganos del beb estn formados. La atencin prenatal es toda la asistencia mdica que usted recibe antes del nacimiento del beb. Asegrese de recibir una buena atencin prenatal y de seguir todas las indicaciones del mdico. CUIDADOS EN EL HOGAR  Medicamentos:  Tome los medicamentos solamente como se lo haya indicado el mdico. Algunos medicamentos se pueden tomar durante el embarazo y otros no.  Tome las vitaminas prenatales como se lo haya indicado el mdico.  Tome el medicamento que la ayuda a defecar (laxante suave) segn sea necesario, si el mdico lo autoriza. Dieta  Ingiera alimentos saludables de manera regular.  El mdico le indicar la cantidad de peso que puede aumentar.  No coma carne cruda ni quesos sin cocinar.  Si tiene malestar estomacal (nuseas) o vomita:  Ingiera 4 o 5comidas pequeas por da en lugar de 3abundantes.  Intente comer algunas galletitas saladas.  Beba lquidos entre las comidas, en lugar de hacerlo durante estas.  Si tiene dificultad para defecar (estreimiento):  Consuma alimentos con alto contenido de fibra, como verduras y frutas frescos, y cereales integrales.  Beba suficiente lquido para mantener el pis (orina) claro o de color amarillo plido. Actividad y ejercicios  Haga ejercicios solamente como se lo haya indicado el mdico. Deje de hacer ejercicios si tiene clicos o dolor en la parte baja del vientre (abdomen) o en la cintura.  Intente no estar de pie durante mucho tiempo. Mueva las piernas con frecuencia si debe estar de pie en un lugar durante  mucho tiempo.  Evite levantar pesos excesivos.  Use zapatos con tacones bajos. Mantenga una buena postura al sentarse y pararse.  Puede tener relaciones sexuales, a menos que el mdico le indique lo contrario. Alivio del dolor o las molestias  Use un sostn que le brinde buen soporte si le duelen las mamas.  Dese baos con agua tibia (baos de asiento) para aliviar el dolor o las molestias a causa de las hemorroides. Use crema antihemorroidal si el mdico se lo permite.  Descanse con las piernas elevadas si tiene calambres o dolor de cintura.  Use medias de descanso si tiene las venas de las piernas hinchadas y abultadas (venas varicosas). Eleve los pies durante 15minutos, 3 o 4veces por da. Limite la cantidad de sal en su dieta. Cuidados prenatales  Programe las visitas prenatales para la semana12 de embarazo.  Escriba sus preguntas. Llvelas cuando concurra a las visitas prenatales.  Concurra a todas las visitas prenatales como se lo haya indicado el mdico. Seguridad  Colquese el cinturn de seguridad cuando conduzca.  Haga una lista con los nmeros de telfono en caso de emergencia, en la cual deben incluirse los nmeros de los familiares, los amigos, el hospital y los departamentos de polica y de bomberos. Consejos generales  Pdale al mdico que la derive a clases prenatales en su localidad. Debe comenzar a tomar las clases antes de entrar en el mes6 de embarazo.  Pida ayuda si necesita asesoramiento o asistencia con la alimentacin. El mdico puede aconsejarla o indicarle dnde recurrir para recibir ayuda.  No se d baos de inmersin en agua caliente,   baos turcos ni saunas.  No se haga duchas vaginales ni use tampones o toallas higinicas perfumadas.  No mantenga las piernas cruzadas durante [place] tiempo.  Evite el contacto con las bandejas sanitarias de los gatos y la tierra que estos animales usan.  No fume, no consuma hierbas ni beba alcohol. No tome  frmacos que el mdico no haya autorizado.  No consuma ningn producto que contenga tabaco, lo que incluye cigarrillos, tabaco de Theatre managermascar o Administrator, Civil Servicecigarrillos electrnicos. Si necesita ayuda para dejar de fumar, consulte al American Expressmdico. Puede recibir asesoramiento u otro tipo de apoyo para dejar de fumar.  Visite al dentista. En su casa, lvese los dientes con un cepillo dental suave. Psese el hilo dental con suavidad. SOLICITE AYUDA SI:  Tiene mareos.  Tiene clicos leves o siente presin en la parte baja del vientre.  Siente un dolor persistente en la zona del vientre.  Sigue teniendo AT&Tmalestar estomacal, vomita o las heces son lquidas (diarrea).  Observa una secrecin, con mal olor que proviene de la vagina.  Siente dolor al ConocoPhillipsorinar.  Tiene el rostro, las Duck Keymanos, las piernas o los tobillos ms hinchados (inflamados). SOLICITE AYUDA DE INMEDIATO SI:   Tiene fiebre.  Tiene una prdida de lquido por la vagina.  Tiene sangrado o pequeas prdidas vaginales.  Tiene clicos o dolor muy intensos en el vientre.  Sube o baja de peso rpidamente.  Vomita sangre. Puede ser similar a la borra del caf  Est en contacto con personas que tienen rubola, la quinta enfermedad o varicela.  Siente un dolor de cabeza muy intenso.  Le falta el aire.  Sufre cualquier tipo de traumatismo, por ejemplo, debido a una cada o un accidente automovilstico.   Esta informacin no tiene Theme park managercomo fin reemplazar el consejo del mdico. Asegrese de hacerle al mdico cualquier pregunta que tenga.   Document Released: 04/10/2008 Document Revised: 02/02/2014 Elsevier Interactive Patient Education 2016 [employer]    The ServiceMaster CompanyLas medicinas seguras para tomar durante el embarazo  Safe Medications in Pregnancy  Acn:  Benzoyl Peroxide (Perxido de benzolo)  Salicylic Acid (cido saliclico)  Dolor de espalda/Dolor de cabeza:  Tylenol: 2 pastillas de concentracin regular cada 4 horas O 2 pastillas de concentracin  fuerte cada 6 horas  Resfriados/Tos/Alergias:  Benadryl (sin alcohol) 25 mg cada 6 horas segn lo necesite Breath Right strips (Tiras para respirar correctamente)  Claritin  Cepacol (pastillas de chupar para la garganta)  Chloraseptic (aerosol para la garganta)  Cold-Eeze- hasta tres veces por da  Cough drops (pastillas de chupar para la tos, sin alcohol)  Flonase (con receta mdica solamente)  Guaifenesin  Mucinex  Robitussin DM (simple solamente, sin alcohol)  Saline nasal spray/drops (Aerosol nasal salino/gotas) Sudafed (pseudoephedrine) y  Actifed * utilizar slo despus de 12 semanas de gestacin y si no tiene la presin arterial alta.  Tylenol Vicks  VapoRub  Zinc lozenges (pastillas para la garganta)  Zyrtec  Estreimiento:  Colace  Ducolax (supositorios)  Fleet enema (lavado intestinal rectal)  Glycerin (supositorios)  Metamucil  Milk of magnesia (leche de magnesia)  Miralax  Senokot  Smooth Move (t)  Diarrea:  Kaopectate Imodium A-D  *NO tome Pepto-Bismol  Hemorroides:  Anusol  Anusol HC  Preparation H  Tucks  Indigestin:  Tums  Maalox  Mylanta  Zantac  Pepcid  Insomnia:  Benadryl (sin alcohol) 25mg  cada 6 horas segn lo necesite  Tylenol PM  Unisom, no Gelcaps  Calambres en las piernas:  Tums  MagGel Nuseas/Vmitos:  Bonine  Dramamine  Emetrol  Ginger (extracto)  Sea-Bands  Meclizine  Medicina para las nuseas que puede tomar durante el embarazo: Unisom (doxylamine succinate, pastillas de 25 mg) Tome una pastilla al da al [place]. Si los sntomas no estn adecuadamente controlados, la dosis puede aumentarse hasta una dosis mxima recomendada de [employer] al da (1/2 pastilla por la [place], 1/2 pastilla a media tarde y Neomia Dear pastilla al [place]). Pastillas de Vitamina B6 de . Tome [employer] veces al da (hasta 200 mg por da).  Erupciones en la piel:  Productos de Aveeno  Benadryl cream (crema o una dosis de  cada 6  horas segn lo necesite)  Calamine Lotion (locin)  1% cortisone cream (crema de cortisona de 1%)  nfeccin vaginal por hongos (candidiasis):  Gyne-lotrimin 7  Monistat 7   **Si est tomando varias medicinas, por favor revise las etiquetas para Art gallery manager los mismos ingredientes [place]. **Tome la medicina segn lo indicado en la etiqueta. **No tome ms de 400 mg de Tylenol en 24 horas. **No tome medicinas que contengan aspirina o ibuprofeno.

## 2015-01-27 NOTE — L&D Delivery Note (Signed)
Delivery Note At 7:49 AM a viable female was delivered via Vaginal, Spontaneous Delivery (Presentation: ;  ).  APGAR: , ; weight  .   Placenta status: Intact, Manual removal.  Cord:  with the following complications: .  Cord pH: not done  Anesthesia: Epidural  Episiotomy:   Lacerations:   Suture Repair: na Est. Blood Loss (mL):    Mom to postpartum.  Baby to Couplet care / Skin to Skin.  Katherine [name] A 07/19/2015, 8:00 AM

## 2015-01-29 LAB — OB RESULTS CONSOLE RUBELLA ANTIBODY, IGM: Rubella: IMMUNE

## 2015-01-29 LAB — OB RESULTS CONSOLE GC/CHLAMYDIA
Chlamydia: NEGATIVE
Gonorrhea: NEGATIVE

## 2015-01-29 LAB — OB RESULTS CONSOLE HIV ANTIBODY (ROUTINE TESTING): HIV: NONREACTIVE

## 2015-01-29 LAB — OB RESULTS CONSOLE ABO/RH: RH TYPE: POSITIVE

## 2015-01-29 LAB — OB RESULTS CONSOLE RPR: RPR: NONREACTIVE

## 2015-01-29 LAB — OB RESULTS CONSOLE ANTIBODY SCREEN: ANTIBODY SCREEN: NEGATIVE

## 2015-01-29 LAB — OB RESULTS CONSOLE HEPATITIS B SURFACE ANTIGEN: HEP B S AG: NEGATIVE

## 2015-05-14 ENCOUNTER — Inpatient Hospital Stay (HOSPITAL_COMMUNITY)
Admission: AD | Admit: 2015-05-14 | Discharge: 2015-05-14 | Disposition: A | Discharge status: Home / Self Care | Payer: Self-pay | Source: Ambulatory Visit | Attending: Obstetrics | Admitting: Obstetrics

## 2015-05-14 ENCOUNTER — Encounter (HOSPITAL_COMMUNITY): Payer: Self-pay | Admitting: *Deleted

## 2015-05-14 DIAGNOSIS — R102 Pelvic and perineal pain: Secondary | ICD-10-CM | POA: Insufficient documentation

## 2015-05-14 DIAGNOSIS — R109 Unspecified abdominal pain: Secondary | ICD-10-CM

## 2015-05-14 DIAGNOSIS — N949 Unspecified condition associated with female genital organs and menstrual cycle: Secondary | ICD-10-CM

## 2015-05-14 DIAGNOSIS — O9989 Other specified diseases and conditions complicating pregnancy, childbirth and the puerperium: Secondary | ICD-10-CM

## 2015-05-14 DIAGNOSIS — O26899 Other specified pregnancy related conditions, unspecified trimester: Secondary | ICD-10-CM

## 2015-05-14 DIAGNOSIS — Z3A29 29 weeks gestation of pregnancy: Secondary | ICD-10-CM | POA: Insufficient documentation

## 2015-05-14 DIAGNOSIS — O26893 Other specified pregnancy related conditions, third trimester: Secondary | ICD-10-CM | POA: Insufficient documentation

## 2015-05-14 LAB — URINALYSIS, ROUTINE W REFLEX MICROSCOPIC
Bilirubin Urine: NEGATIVE
GLUCOSE, UA: NEGATIVE mg/dL
Hgb urine dipstick: NEGATIVE
KETONES UR: NEGATIVE mg/dL
Nitrite: NEGATIVE
Protein, ur: NEGATIVE mg/dL
Specific Gravity, Urine: 1.01 (ref 1.005–1.030)
pH: 6 (ref 5.0–8.0)

## 2015-05-14 LAB — URINE MICROSCOPIC-ADD ON: RBC / HPF: NONE SEEN RBC/hpf (ref 0–5)

## 2015-05-14 LAB — WET PREP, GENITAL
Clue Cells Wet Prep HPF POC: NONE SEEN
Sperm: NONE SEEN
Trich, Wet Prep: NONE SEEN
YEAST WET PREP: NONE SEEN

## 2015-05-14 MED ORDER — SIMETHICONE 80 MG PO CHEW
80.0000 mg | CHEWABLE_TABLET | Freq: Once | ORAL | Status: AC
  Administered 2015-05-14: 80 mg via ORAL
  Filled 2015-05-14: qty 1

## 2015-05-14 NOTE — MAU Provider Note (Signed)
History     CSN: [id number]  Arrival date and time: 05/14/15 2054   First Provider Initiated Contact with Patient 05/14/15 2122      Chief Complaint  Patient presents with  . Contractions  . Abdominal Pain  . Pelvic Pain   HPI Comments: Katherine [name] is a 40 y.o. G6P5005 at [redacted]w[redacted]d who presents today with contractions. She states that the pain started about 4 days ago. She denies any VB or LOF. She confirms fetal movement. She denies any hx of preterm birth. She denies any complications with this pregnancy. She denies any intercourse in the last 24 hours.   Abdominal Pain This is a new problem. Episode onset: 4 days ago.  The onset quality is gradual. The problem occurs intermittently (about every 10 mins ). The pain is located in the suprapubic region and periumbilical region. The pain is at a severity of 8/10. The quality of the pain is cramping. The abdominal pain does not radiate. Pertinent negatives include no constipation (last BM: 05/13/15 ), diarrhea, dysuria, fever, frequency, nausea or vomiting. Nothing aggravates the pain. The pain is relieved by nothing. She has tried nothing for the symptoms.    Past Medical History  Diagnosis Date  . Depression     postpartum depression  . Anemia   . Splenomegaly     Past Surgical History  Procedure Laterality Date  . No past surgeries      Family History  Problem Relation Age of Onset  . Diabetes Father   . Kidney disease Brother     Social History  Substance Use Topics  . Smoking status: Never Smoker   . Smokeless tobacco: Never Used  . Alcohol Use: No    Allergies: No Known Allergies  Prescriptions prior to admission  Medication Sig Dispense Refill Last Dose  . meclizine (ANTIVERT) 25 MG tablet Take 1 tablet (25 mg total) by mouth 3 (three) times daily as needed for dizziness. 30 tablet 0   . Prenatal Vit-Fe Fumarate-FA (PRENATAL MULTIVITAMIN) TABS tablet Take 1 tablet by mouth daily at 12 noon.   11/15/2014  at Unknown time  . promethazine (PHENERGAN) 25 MG tablet Take 1 tablet (25 mg total) by mouth every 6 (six) hours as needed for nausea or vomiting. 30 tablet 0     Review of Systems  Constitutional: Negative for fever.  Gastrointestinal: Positive for abdominal pain. Negative for nausea, vomiting, diarrhea and constipation (last BM: 05/13/15 ).  Genitourinary: Negative for dysuria, urgency and frequency.   Physical Exam   Blood pressure 109/54, pulse 91, temperature 98.5 F (36.9 C), temperature source Oral, resp. rate 20, height  (1.549 m), weight 96.219 kg (212 lb 2 oz), last menstrual period 10/11/2014, currently breastfeeding.  Physical Exam  Nursing note and vitals reviewed. Constitutional: She is oriented to person, place, and time. She appears well-developed and well-nourished. No distress.  HENT:  Head: Normocephalic.  Cardiovascular: Normal rate.   Respiratory: Effort normal.  GI: Soft. There is no tenderness. There is no rebound.  Genitourinary:  Cervix: closed/thick/high   Neurological: She is alert and oriented to person, place, and time.  Skin: Skin is warm and dry.  Psychiatric: She has a normal mood and affect.   FHT: 130, moderate with 15x15 accels, no decels Toco: no UCs  MAU Course  Procedures  MDM Patient had simethicone, and reports feeling better. She has not had any contractions on the monitor while here. Cervix is closed.   Assessment and Plan  1. Round ligament pain   2. Abdominal pain in pregnancy   3. [redacted] weeks gestation of pregnancy    DC home Comfort measures reviewed  3rd Trimester precautions  PTL precautions  Fetal kick counts RX: none  Return to MAU as needed FU with OB as planned  Follow-up Information    Follow up with Kathreen CosierMARSHALL,BERNARD A, MD.   Specialty:  Obstetrics and Gynecology   Why:  As scheduled   Contact information:   [address] [place] [id number] [phone]         Katherine CrookHogan, Katherine Klees  [name] 05/14/2015, 9:24 PM

## 2015-05-14 NOTE — MAU Note (Signed)
PT SAYS  WITH DIANA  HER DAUGHTER.-   THAT  SHE FEELS PRESSURE - STARTED 4 DAYS     AGO.   FEELS  SOME UC-   STARTED  YESTERDAY - ONLY AT NIGHT.    NO VE IN OFFICE.   LAST SEX-     2 WEEKS AGO.     [facility]- DR MARSHALL.       DENIES HSV AND  MRSA.

## 2015-05-14 NOTE — Discharge Instructions (Signed)

## 2015-06-18 LAB — OB RESULTS CONSOLE GBS: STREP GROUP B AG: POSITIVE

## 2015-06-28 ENCOUNTER — Encounter (HOSPITAL_COMMUNITY): Payer: Self-pay | Admitting: *Deleted

## 2015-06-28 ENCOUNTER — Inpatient Hospital Stay (HOSPITAL_COMMUNITY)
Admission: AD | Admit: 2015-06-28 | Discharge: 2015-06-28 | Disposition: A | Discharge status: Home / Self Care | Payer: Self-pay | Source: Ambulatory Visit | Attending: Obstetrics | Admitting: Obstetrics

## 2015-06-28 DIAGNOSIS — Z3A37 37 weeks gestation of pregnancy: Secondary | ICD-10-CM | POA: Insufficient documentation

## 2015-06-28 DIAGNOSIS — O2343 Unspecified infection of urinary tract in pregnancy, third trimester: Secondary | ICD-10-CM | POA: Insufficient documentation

## 2015-06-28 LAB — URINALYSIS, ROUTINE W REFLEX MICROSCOPIC
Bilirubin Urine: NEGATIVE
GLUCOSE, UA: NEGATIVE mg/dL
Hgb urine dipstick: NEGATIVE
KETONES UR: NEGATIVE mg/dL
NITRITE: NEGATIVE
PH: 5.5 (ref 5.0–8.0)
Protein, ur: NEGATIVE mg/dL
Specific Gravity, Urine: <1.005 — ABNORMAL LOW (ref 1.005–1.030)

## 2015-06-28 LAB — URINE MICROSCOPIC-ADD ON: RBC / HPF: NONE SEEN RBC/hpf (ref 0–5)

## 2015-06-28 MED ORDER — NITROFURANTOIN MONOHYD MACRO 100 MG PO CAPS: 100.0000 mg | ORAL_CAPSULE | Freq: Two times a day (BID) | ORAL | Status: DC

## 2015-06-28 NOTE — MAU Note (Signed)
Unable to void on arrival.

## 2015-06-28 NOTE — MAU Provider Note (Signed)
History     CSN: [id number]  Arrival date and time: 06/28/15 0104   None      Chief Complaint  Patient presents with  . Back Pain  . Abdominal Pain   HPI [name] NoeVeronica Garza-Cardoza is a 40 y.o. G6P5005 at 7225w2d who presents with abdominal cramping & low back pain. Pt was seen by Dr. Gaynell FaceMarshall earlier in the week & diagnosed with UTI; pt took rx to pharmacy but never picked up the medication. Reports lower abdominal cramping & low back pain since yesterday. Reports mild irregular ctx. Denies vaginal bleeding, LOF, flank pain, fever/chills, hematuria, or dysuria. Positive fetal movement.   OB History    Gravida Para Term Preterm AB TAB SAB Ectopic Multiple Living   6 5 5       5       Past Medical History  Diagnosis Date  . Depression     postpartum depression  . Anemia   . Splenomegaly     Past Surgical History  Procedure Laterality Date  . No past surgeries      Family History  Problem Relation Age of Onset  . Diabetes Father   . Kidney disease Brother     Social History  Substance Use Topics  . Smoking status: Never Smoker   . Smokeless tobacco: Never Used  . Alcohol Use: No    Allergies: No Known Allergies  Prescriptions prior to admission  Medication Sig Dispense Refill Last Dose  . Prenatal Vit-Fe Fumarate-FA (PRENATAL MULTIVITAMIN) TABS tablet Take 1 tablet by mouth daily at 12 noon.   05/13/2015 at Unknown time    Review of Systems  Constitutional: Negative for fever and chills.  Gastrointestinal: Positive for abdominal pain. Negative for nausea and vomiting.  Genitourinary: Positive for frequency. Negative for dysuria, hematuria and flank pain.   Physical Exam   Blood pressure 107/56, pulse 68, temperature 97.7 F (36.5 C), temperature source Oral, resp. rate 18, height 5\' 1"  (1.549 m), weight 212 lb 2 oz (96.219 kg), last menstrual period 10/11/2014, currently breastfeeding.  Physical Exam  Nursing note and vitals reviewed. Constitutional: She is  oriented to person, place, and time. She appears well-developed and well-nourished. No distress.  HENT:  Head: Normocephalic and atraumatic.  Eyes: Conjunctivae are normal. Right eye exhibits no discharge. Left eye exhibits no discharge. No scleral icterus.  Neck: Normal range of motion.  Cardiovascular: Normal rate.   Respiratory: Effort normal. No respiratory distress.  GI: Soft. There is no CVA tenderness.  Neurological: She is alert and oriented to person, place, and time.  Skin: Skin is warm and dry. She is not diaphoretic.  Psychiatric: She has a normal mood and affect. Her behavior is normal. Judgment and thought content normal.   Dilation: Closed Station:  (high) Exam by:: Dellie BurnsScarlett Murray, RN BSN  Fetal Tracing:  Baseline: 125 Variability: moderate Accelerations: 15x15 Decelerations: none  Toco: UI   MAU Course  Procedures Results for orders placed or performed during the hospital encounter of 06/28/15 (from the past 24 hour(s))  Urinalysis, Routine w reflex microscopic (not [facility])     Status: Abnormal   Collection Time: 06/28/15  1:15 AM  Result Value Ref Range   Color, Urine YELLOW YELLOW   APPearance CLEAR CLEAR   Specific Gravity, Urine <1.005 (L) 1.005 - 1.030   pH 5.5 5.0 - 8.0   Glucose, UA NEGATIVE NEGATIVE mg/dL   Hgb urine dipstick NEGATIVE NEGATIVE   Bilirubin Urine NEGATIVE NEGATIVE   Ketones, ur  NEGATIVE NEGATIVE mg/dL   Protein, ur NEGATIVE NEGATIVE mg/dL   Nitrite NEGATIVE NEGATIVE   Leukocytes, UA MODERATE (A) NEGATIVE  Urine microscopic-add on     Status: Abnormal   Collection Time: 06/28/15  1:15 AM  Result Value Ref Range   Squamous Epithelial / LPF 0-5 (A) NONE SEEN   WBC, UA 6-30 0 - 5 WBC/hpf   RBC / HPF NONE SEEN 0 - 5 RBC/hpf   Bacteria, UA RARE (A) NONE SEEN    MDM Reactive tracing Cervix closed No evidence of infection Will print out rx for abx so patient can start treatment tonight Urine culture sent  Assessment and Plan   A: 1. UTI in pregnancy, antepartum, third trimester     P; Discharge home Rx macrobid Urine culture pending Keep f/u with ob Discussed reasons to return  Judeth Horn 06/28/2015, 1:51 AM

## 2015-06-28 NOTE — MAU Note (Signed)
Patient presents at [redacted] weeks gestation with c/o mild contractions, vaginal pressure and lower back pain since yesterday. Fetus active. Denies bleeding or discharge.

## 2015-06-28 NOTE — Discharge Instructions (Signed)
Embarazo e infeccin del tracto urinario (Pregnancy and Urinary Tract Infection) Una infeccin urinaria (IU) puede ocurrir en Clinical cytogeneticist del tracto urinario. La infeccin urinaria puede [employer] utteres, los riones (pielonefritis), la vejiga (cistitis) y Geologist, engineering (uretritis). Todas las mujeres embarazadas deben ser estudiadas para diagnosticar la presencia de bacterias en el tracto urinario. La identificacin y el tratamiento de una infeccin urinaria disminuye el riesgo de un parto prematuro y de Actor infecciones ms graves en la madre y el beb. CAUSAS Las bacterias causan casi todas las infecciones urinarias.  FACTORES DE RIESGO Hay muchos factores que pueden aumentar sus probabilidades de contraer una infeccin urinaria (IU) durante el [place]. Pueden ser:  Lucilla Edin uretra corta.  Falta de aseo y malos hbitos de higiene.  [place].  Obstruccin de la orina en el tracto urinario.  Problemas con los msculos o nervios plvicos.  Diabetes.  Obesidad.  Problemas en la vejiga despus de tener varios hijos.  Antecedentes de infeccin urinaria. SIGNOS Y SNTOMAS   Dolor, ardor o sensacin de ardor al [employer].  Sentir la necesidad de Garment/textile technologist de inmediato [place]).  Prdida del control vesical (incontinencia urinaria).  Orinar con ms frecuencia de lo comn en el embarazo.  Malestar en la zona inferior del abdomen o en la espalda.  Bennie Hind turbia.  Sangre en la orina (hematuria).  Cristy Hilts. Cuando se infectan los riones, los sntomas pueden ser:  Dolor de espalda.  Dolor lateral en el lado derecho ms que en el lado izquierdo.  Cristy Hilts.  Escalofros.  Nuseas.  Vmitos. DIAGNSTICO  Una infeccin del tracto urinario se suele diagnosticar a travs de la orina. A veces se realizan pruebas y procedimientos adicionales. Estos pueden ser:  Steward Drone de los riones, los urteres, la vejiga y Geologist, engineering.  Observar la vejiga con un  tubo que ilumina (cistoscopa). TRATAMIENTO Por lo general, las IU pueden tratarse con medicamentos antibiticos.  INSTRUCCIONES PARA EL CUIDADO EN EL HOGAR   Tome slo medicamentos de venta libre o recetados, segn las indicaciones del mdico. Si le han recetado antibiticos, tmelos segn las indicaciones. Tmelos todos, aunque se sienta mejor.  Beba suficiente lquido para Consulting civil engineer orina clara o de color amarillo plido.  No tenga relaciones sexuales hasta que la infeccin haya desaparecido o el mdico la autorice.  Asegrese de [place] [employer] hagan estudios para Hydrographic surveyor una infeccin urinaria durante el [place]. Estas infecciones suelen reaparecer. Para prevenir una infeccin urinaria en el futuro  Practique buenos hbitos higinicos. Siempre debe limpiarse desde adelante hacia atrs. Use el tissue slo una vez.  No retenga la orina. Orine tan pronto como sea posible cuando tenga ganas.  No se haga duchas vaginales ni use desodorantes en aerosol.  Lave con agua tibia y jabn alrededor de la zona genital y el ano.  Vace la vejiga antes y despus de Clinical biochemist.  Use ropa interior con algodn en la entrepierna.  Evite la cafena y las bebidas gaseosas. Estas sustancias irritan la vejiga.  Beba jugo de arndanos o tome comprimidos de arndano. Esto puede disminuir el riesgo de sufrir una infeccin urinaria.  No beba alcohol.  Cumpla con las visitas de control y hgase todos los anlisis segn lo programado. SOLICITE ATENCIN MDICA SI:   Los sntomas empeoran.  Tiene fiebre an despus de 2 das [place].  Tiene una erupcin.  Siente que usted tiene problemas con los medicamentos recetados.  Tiene flujo vaginal anormal. SOLICITE ATENCIN MDICA DE INMEDIATO SI:  Siente dolor en la espalda o a los lados. °· Tiene escalofríos. °· Observa sangre en la orina. °· Tiene náuseas o vómitos. °· Siente contracciones en el útero. °· Tiene una  perdida de líquido en chorro por la vagina. °ASEGÚRESE DE QUE: °· Comprende estas instrucciones.   °· Controlará su afección.   °· Recibirá ayuda de inmediato si no mejora o si empeora.   °  °Esta información no tiene como fin reemplazar el consejo del médico. Asegúrese de hacerle al médico cualquier pregunta que tenga. °  °Document Released: 10/07/2011 Document Revised: 11/02/2012 °Elsevier Interactive Patient Education ©2016 Elsevier Inc. ° °

## 2015-06-28 NOTE — MAU Note (Signed)
Patient states she went to her OB on 06/25/15 and was informed that she has a UTI. Was given a prescription and has taken it to the pharmacy to fill but has not picked the medicine up from the pharmacy yet.

## 2015-06-29 LAB — CULTURE, OB URINE

## 2015-07-12 ENCOUNTER — Inpatient Hospital Stay (HOSPITAL_COMMUNITY)
Admission: AD | Admit: 2015-07-12 | Discharge: 2015-07-12 | Disposition: A | Discharge status: Home / Self Care | Payer: Self-pay | Source: Ambulatory Visit | Attending: Obstetrics | Admitting: Obstetrics

## 2015-07-12 ENCOUNTER — Encounter (HOSPITAL_COMMUNITY): Payer: Self-pay | Admitting: *Deleted

## 2015-07-12 DIAGNOSIS — Z3493 Encounter for supervision of normal pregnancy, unspecified, third trimester: Secondary | ICD-10-CM | POA: Insufficient documentation

## 2015-07-12 NOTE — MAU Note (Signed)
Pt states she has been having U/C's about every 10 minutes since this morning and now with vaginal pressure.  Pt has been taking Ampicillin since last Tues for UTI.  Good fetal movement.  Denies ROM, vaginal bleeding or abnormal discharge.

## 2015-07-12 NOTE — Discharge Instructions (Signed)
Evaluación de los movimientos fetales  °(Fetal Movement Counts) °Nombre del paciente: __________________________________________________ Fecha de parto estimada: ____________________ °La evaluación de los movimientos fetales es muy recomendable en los embarazos de alto riesgo, pero también es una buena idea que lo hagan todas las embarazadas. El médico le indicará que comience a contarlos a las 28 semanas de embarazo. Los movimientos fetales suelen aumentar:  °· Después de una comida completa. °· Después de la actividad física. °· Después de comer o beber algo dulce o frío. °· En reposo. °Preste atención cuando sienta que el bebé está más activo. Esto le ayudará a notar un patrón de ciclos de vigilia y sueño de su bebé y cuáles son los factores que contribuyen a un aumento de los movimientos fetales. Es importante llevar a cabo un recuento de movimientos fetales, al mismo tiempo cada día, cuando el bebé normalmente está más activo.  °CÓMO CONTAR LOS MOVIMIENTOS FETALES °1. Busque un lugar tranquilo y cómodo para sentarse o recostarse sobre el lado izquierdo. Al recostarse sobre su lado izquierdo, le proporciona una mejor circulación de sangre y oxígeno al bebé. °2. Anote el día y la hora en una hoja de papel o en un diario. °3. Comience contando las pataditas, revoloteos, chasquidos, vueltas o pinchazos en un período de 2 horas. Debe sentir al menos 10 movimientos en 2 horas. °4. Si no siente 10 movimientos en 2 horas, espere 2 ó 3 horas y cuente de nuevo. Busque cambios en el patrón o si no cuenta lo suficiente en 2 horas. °SOLICITE ATENCIÓN MÉDICA SI:  °· Siente menos de 10 pataditas en 2 horas, en dos intentos. °· No hay movimientos durante una hora. °· El patrón se modifica o le lleva más tiempo cada día contar las 10 pataditas. °· Siente que el bebé no se mueve como lo hace habitualmente. °Fecha: ____________ Movimientos: ____________ Hora de inicio: ____________ Hora de finalización: ____________  °Fecha:  ____________ Movimientos: ____________ Hora de inicio: ____________ Hora de finalización: ____________  °Fecha: ____________ Movimientos: ____________ Hora de inicio: ____________ Hora de finalización: ____________  °Fecha: ____________ Movimientos: ____________ Hora de inicio: ____________ Hora de finalización: ____________  °Fecha: ____________ Movimientos: ____________ Hora de inicio: ____________ Hora de finalización: ____________  °Fecha: ____________ Movimientos: ____________ Hora de inicio: ____________ Hora de finalización: ____________  °Fecha: ____________ Movimientos: ____________ Hora de inicio: ____________ Hora de finalización: ____________  °Fecha: ____________ Movimientos: ____________ Hora de inicio: ____________ Hora de finalización: ____________  °Fecha: ____________ Movimientos: ____________ Hora de inicio: ____________ Hora de finalización: ____________  °Fecha: ____________ Movimientos: ____________ Hora de inicio: ____________ Hora de finalización: ____________  °Fecha: ____________ Movimientos: ____________ Hora de inicio: ____________ Hora de finalización: ____________  °Fecha: ____________ Movimientos: ____________ Hora de inicio: ____________ Hora de finalización: ____________  °Fecha: ____________ Movimientos: ____________ Hora de inicio: ____________ Hora de finalización: ____________  °Fecha: ____________ Movimientos: ____________ Hora de inicio: ____________ Hora de finalización: ____________  °Fecha: ____________ Movimientos: ____________ Hora de inicio: ____________ Hora de finalización: ____________  °Fecha: ____________ Movimientos: ____________ Hora de inicio: ____________ Hora de finalización: ____________  °Fecha: ____________ Movimientos: ____________ Hora de inicio: ____________ Hora de finalización: ____________  °Fecha: ____________ Movimientos: ____________ Hora de inicio: ____________ Hora de finalización: ____________  °Fecha: ____________ Movimientos: ____________ Hora  de inicio: ____________ Hora de finalización: ____________  °Fecha: ____________ Movimientos: ____________ Hora de inicio: ____________ Hora de finalización: ____________  °Fecha: ____________ Movimientos: ____________ Hora de inicio: ____________ Hora de finalización: ____________  °Fecha: ____________ Movimientos: ____________ Hora de inicio: ____________ Hora de   finalización: ____________  °Fecha: ____________ Movimientos: ____________ Hora de inicio: ____________ Hora de finalización: ____________  °Fecha: ____________ Movimientos: ____________ Hora de inicio: ____________ Hora de finalización: ____________  °Fecha: ____________ Movimientos: ____________ Hora de inicio: ____________ Hora de finalización: ____________  °Fecha: ____________ Movimientos: ____________ Hora de inicio: ____________ Hora de finalización: ____________  °Fecha: ____________ Movimientos: ____________ Hora de inicio: ____________ Hora de finalización: ____________  °Fecha: ____________ Movimientos: ____________ Hora de inicio: ____________ Hora de finalización: ____________  °Fecha: ____________ Movimientos: ____________ Hora de inicio: ____________ Hora de finalización: ____________  °Fecha: ____________ Movimientos: ____________ Hora de inicio: ____________ Hora de finalización: ____________  °Fecha: ____________ Movimientos: ____________ Hora de inicio: ____________ Hora de finalización: ____________  °Fecha: ____________ Movimientos: ____________ Hora de inicio: ____________ Hora de finalización: ____________  °Fecha: ____________ Movimientos: ____________ Hora de inicio: ____________ Hora de finalización: ____________  °Fecha: ____________ Movimientos: ____________ Hora de inicio: ____________ Hora de finalización: ____________  °Fecha: ____________ Movimientos: ____________ Hora de inicio: ____________ Hora de finalización: ____________  °Fecha: ____________ Movimientos: ____________ Hora de inicio: ____________ Hora de finalización:  ____________  °Fecha: ____________ Movimientos: ____________ Hora de inicio: ____________ Hora de finalización: ____________  °Fecha: ____________ Movimientos: ____________ Hora de inicio: ____________ Hora de finalización: ____________  °Fecha: ____________ Movimientos: ____________ Hora de inicio: ____________ Hora de finalización: ____________  °Fecha: ____________ Movimientos: ____________ Hora de inicio: ____________ Hora de finalización: ____________  °Fecha: ____________ Movimientos: ____________ Hora de inicio: ____________ Hora de finalización: ____________  °Fecha: ____________ Movimientos: ____________ Hora de inicio: ____________ Hora de finalización: ____________  °Fecha: ____________ Movimientos: ____________ Hora de inicio: ____________ Hora de finalización: ____________  °Fecha: ____________ Movimientos: ____________ Hora de inicio: ____________ Hora de finalización: ____________  °Fecha: ____________ Movimientos: ____________ Hora de inicio: ____________ Hora de finalización: ____________  °Fecha: ____________ Movimientos: ____________ Hora de inicio: ____________ Hora de finalización: ____________  °Fecha: ____________ Movimientos: ____________ Hora de inicio: ____________ Hora de finalización: ____________  °Fecha: ____________ Movimientos: ____________ Hora de inicio: ____________ Hora de finalización: ____________  °Fecha: ____________ Movimientos: ____________ Hora de inicio: ____________ Hora de finalización: ____________  °Fecha: ____________ Movimientos: ____________ Hora de inicio: ____________ Hora de finalización: ____________  °Fecha: ____________ Movimientos: ____________ Hora de inicio: ____________ Hora de finalización: ____________  °Fecha: ____________ Movimientos: ____________ Hora de inicio: ____________ Hora de finalización: ____________  °Fecha: ____________ Movimientos: ____________ Hora de inicio: ____________ Hora de finalización: ____________  °Fecha: ____________  Movimientos: ____________ Hora de inicio: ____________ Hora de finalización: ____________  °Fecha: ____________ Movimientos: ____________ Hora de inicio: ____________ Hora de finalización: ____________  °Fecha: ____________ Movimientos: ____________ Hora de inicio: ____________ Hora de finalización: ____________  °  °Esta información no tiene como fin reemplazar el consejo del médico. Asegúrese de hacerle al médico cualquier pregunta que tenga. °  °Document Released: 04/21/2007 Document Revised: 12/30/2011 °Elsevier Interactive Patient Education ©2016 Elsevier Inc. ° °

## 2015-07-14 ENCOUNTER — Inpatient Hospital Stay (HOSPITAL_COMMUNITY)
Admission: AD | Admit: 2015-07-14 | Discharge: 2015-07-14 | Disposition: A | Discharge status: Home / Self Care | Payer: Self-pay | Source: Ambulatory Visit | Attending: Obstetrics | Admitting: Obstetrics

## 2015-07-14 ENCOUNTER — Encounter (HOSPITAL_COMMUNITY): Payer: Self-pay

## 2015-07-14 DIAGNOSIS — Z3493 Encounter for supervision of normal pregnancy, unspecified, third trimester: Secondary | ICD-10-CM | POA: Insufficient documentation

## 2015-07-14 NOTE — MAU Note (Signed)
Pt reports lower abd pressure, some pink discharge.

## 2015-07-14 NOTE — Progress Notes (Signed)
May D/C home with precautions 

## 2015-07-16 ENCOUNTER — Other Ambulatory Visit: Payer: Self-pay | Admitting: Obstetrics

## 2015-07-18 ENCOUNTER — Encounter (HOSPITAL_COMMUNITY): Payer: Self-pay | Admitting: *Deleted

## 2015-07-18 ENCOUNTER — Inpatient Hospital Stay (HOSPITAL_COMMUNITY): Payer: Medicaid Other | Admitting: Anesthesiology

## 2015-07-18 ENCOUNTER — Inpatient Hospital Stay (HOSPITAL_COMMUNITY)
Admission: AD | Admit: 2015-07-18 | Discharge: 2015-07-20 | DRG: 775 | Disposition: A | Discharge status: Home / Self Care | Payer: Medicaid Other | Source: Ambulatory Visit | Attending: Obstetrics | Admitting: Obstetrics

## 2015-07-18 ENCOUNTER — Inpatient Hospital Stay (HOSPITAL_COMMUNITY): Payer: Medicaid Other

## 2015-07-18 DIAGNOSIS — IMO0001 Reserved for inherently not codable concepts without codable children: Secondary | ICD-10-CM

## 2015-07-18 DIAGNOSIS — Z3A40 40 weeks gestation of pregnancy: Secondary | ICD-10-CM | POA: Diagnosis not present

## 2015-07-18 DIAGNOSIS — Z833 Family history of diabetes mellitus: Secondary | ICD-10-CM

## 2015-07-18 DIAGNOSIS — O99824 Streptococcus B carrier state complicating childbirth: Principal | ICD-10-CM | POA: Diagnosis present

## 2015-07-18 DIAGNOSIS — Z3493 Encounter for supervision of normal pregnancy, unspecified, third trimester: Secondary | ICD-10-CM

## 2015-07-18 LAB — CBC
HEMATOCRIT: 34.5 % — AB (ref 36.0–46.0)
Hemoglobin: 11.7 g/dL — ABNORMAL LOW (ref 12.0–15.0)
MCH: 26.6 pg (ref 26.0–34.0)
MCHC: 33.9 g/dL (ref 30.0–36.0)
MCV: 78.4 fL (ref 78.0–100.0)
Platelets: 250 10*3/uL (ref 150–400)
RBC: 4.4 MIL/uL (ref 3.87–5.11)
RDW: 14.6 % (ref 11.5–15.5)
WBC: 14.2 10*3/uL — ABNORMAL HIGH (ref 4.0–10.5)

## 2015-07-18 LAB — TYPE AND SCREEN
ABO/RH(D): O POS
ANTIBODY SCREEN: NEGATIVE

## 2015-07-18 MED ORDER — LIDOCAINE HCL (PF) 1 % IJ SOLN
INTRAMUSCULAR | Status: DC | PRN
  Administered 2015-07-18: 2 mL via EPIDURAL
  Administered 2015-07-18: 3 mL via EPIDURAL
  Administered 2015-07-18: 5 mL via EPIDURAL

## 2015-07-18 MED ORDER — OXYTOCIN 40 UNITS IN LACTATED RINGERS INFUSION - SIMPLE MED
2.5000 [IU]/h | INTRAVENOUS | Status: DC
  Administered 2015-07-19: 2.5 [IU]/h via INTRAVENOUS
  Filled 2015-07-18: qty 1000

## 2015-07-18 MED ORDER — AMPICILLIN SODIUM 2 G IJ SOLR
2.0000 g | Freq: Four times a day (QID) | INTRAMUSCULAR | Status: DC
  Administered 2015-07-18 – 2015-07-19 (×2): 2 g via INTRAVENOUS
  Filled 2015-07-18 (×3): qty 2000

## 2015-07-18 MED ORDER — OXYCODONE-ACETAMINOPHEN 5-325 MG PO TABS: 1.0000 | ORAL_TABLET | ORAL | Status: DC | PRN

## 2015-07-18 MED ORDER — FENTANYL 2.5 MCG/ML BUPIVACAINE 1/10 % EPIDURAL INFUSION (WH - ANES)
INTRAMUSCULAR | Status: AC
  Administered 2015-07-18: 14 mL/h via EPIDURAL
  Filled 2015-07-18: qty 125

## 2015-07-18 MED ORDER — FLEET ENEMA 7-19 GM/118ML RE ENEM: 1.0000 | ENEMA | RECTAL | Status: DC | PRN

## 2015-07-18 MED ORDER — ACETAMINOPHEN 325 MG PO TABS
650.0000 mg | ORAL_TABLET | ORAL | Status: DC | PRN
Start: 2015-07-18 — End: 2015-07-19

## 2015-07-18 MED ORDER — OXYTOCIN BOLUS FROM INFUSION: 500.0000 mL | INTRAVENOUS | Status: DC

## 2015-07-18 MED ORDER — LACTATED RINGERS IV SOLN: 500.0000 mL | INTRAVENOUS | Status: DC | PRN

## 2015-07-18 MED ORDER — LACTATED RINGERS IV SOLN
INTRAVENOUS | Status: DC
  Administered 2015-07-18: 21:00:00 via INTRAVENOUS

## 2015-07-18 MED ORDER — ONDANSETRON HCL 4 MG/2ML IJ SOLN: 4.0000 mg | Freq: Four times a day (QID) | INTRAMUSCULAR | Status: DC | PRN

## 2015-07-18 MED ORDER — SOD CITRATE-CITRIC ACID 500-334 MG/5ML PO SOLN: 30.0000 mL | ORAL | Status: DC | PRN

## 2015-07-18 MED ORDER — PHENYLEPHRINE 40 MCG/ML (10ML) SYRINGE FOR IV PUSH (FOR BLOOD PRESSURE SUPPORT)
PREFILLED_SYRINGE | INTRAVENOUS | Status: DC
Start: 2015-07-18 — End: 2015-07-19
  Filled 2015-07-18: qty 20

## 2015-07-18 MED ORDER — LIDOCAINE HCL (PF) 1 % IJ SOLN
30.0000 mL | INTRAMUSCULAR | Status: DC | PRN
  Filled 2015-07-18: qty 30

## 2015-07-18 MED ORDER — OXYCODONE-ACETAMINOPHEN 5-325 MG PO TABS: 2.0000 | ORAL_TABLET | ORAL | Status: DC | PRN

## 2015-07-18 NOTE — Anesthesia Preprocedure Evaluation (Addendum)
Anesthesia Evaluation  Patient identified by MRN, date of birth, ID band Patient awake    Reviewed: Allergy & Precautions, NPO status , Patient's Chart, lab work & pertinent test results  Airway Mallampati: III  TM Distance: >3 FB Neck ROM: Full    Dental  (+) Teeth Intact, Dental Advisory Given   Pulmonary neg pulmonary ROS,    Pulmonary exam normal breath sounds clear to auscultation       Cardiovascular negative cardio ROS Normal cardiovascular exam Rhythm:Regular Rate:Normal     Neuro/Psych PSYCHIATRIC DISORDERS Depression negative neurological ROS     GI/Hepatic negative GI ROS, Neg liver ROS,   Endo/Other  negative endocrine ROS  Renal/GU negative Renal ROS     Musculoskeletal negative musculoskeletal ROS (+)   Abdominal   Peds  Hematology  (+) Blood dyscrasia, anemia , Plt 250k   Anesthesia Other Findings Day of surgery medications reviewed with the patient.  Reproductive/Obstetrics (+) Pregnancy                             Anesthesia Physical Anesthesia Plan  ASA: III  Anesthesia Plan: Epidural   Post-op Pain Management:    Induction:   Airway Management Planned:   Additional Equipment:   Intra-op Plan:   Post-operative Plan:   Informed Consent: I have reviewed the patients History and Physical, chart, labs and discussed the procedure including the risks, benefits and alternatives for the proposed anesthesia with the patient or authorized representative who has indicated his/her understanding and acceptance.   Dental advisory given  Plan Discussed with:   Anesthesia Plan Comments: (Patient identified. Risks/Benefits/Options discussed with patient including but not limited to bleeding, infection, nerve damage, paralysis, failed block, incomplete pain control, headache, blood pressure changes, nausea, vomiting, reactions to medication both or allergic, itching and  postpartum back pain. Confirmed with bedside nurse the patient's most recent platelet count. Confirmed with patient that they are not currently taking any anticoagulation, have any bleeding history or any family history of bleeding disorders. Patient expressed understanding and wished to proceed. All questions were answered.   **Spanish interpreter used throughout patient encounter.**)       Anesthesia Quick Evaluation

## 2015-07-18 NOTE — H&P (Signed)
Katherine [name] is a 40 y.o. female presenting for UC's. Maternal Medical History:  Reason for admission: Contractions.   Fetal activity: Perceived fetal activity is normal.   Last perceived fetal movement was within the past hour.    Prenatal complications: no prenatal complications Prenatal Complications - Diabetes: none.    OB History    Gravida Para Term Preterm AB TAB SAB Ectopic Multiple Living   6 5 5       5      Past Medical History  Diagnosis Date  . Depression     postpartum depression  . Anemia   . Splenomegaly    Past Surgical History  Procedure Laterality Date  . No past surgeries     Family History: family history includes Diabetes in her father; Kidney disease in her brother. Social History:  reports that she has never smoked. She has never used smokeless tobacco. She reports that she does not drink alcohol or use illicit drugs.   Prenatal Transfer Tool  Maternal Diabetes: No Genetic Screening: Declined Maternal Ultrasounds/Referrals: Normal Fetal Ultrasounds or other Referrals:  Referred to Materal Fetal Medicine  Maternal Substance Abuse:  No Significant Maternal Medications:  None Significant Maternal Lab Results:  GBS positive Other Comments:  None  Review of Systems  All other systems reviewed and are negative.   Dilation: 5.5 Effacement (%): 80 Station: -2 Exam by:: Katherine EavesAshley Garvey RN  Blood pressure 108/62, pulse 73, temperature 97.9 F (36.6 C), temperature source Oral, resp. rate 18, height 5' 0.5" (1.537 m), weight 220 lb (99.791 kg), last menstrual period 10/11/2014, currently breastfeeding. Maternal Exam:  Abdomen: Fetal presentation: vertex  Introitus: Normal vulva. Normal vagina.  Cervix: Cervix evaluated by digital exam.     Physical Exam  Nursing note and vitals reviewed. Constitutional: She is oriented to person, place, and time. She appears well-developed and well-nourished.  HENT:  Head: Normocephalic and atraumatic.   Eyes: Conjunctivae are normal. Pupils are equal, round, and reactive to light.  Neck: Normal range of motion. Neck supple.  Cardiovascular: Normal rate and regular rhythm.   Respiratory: Effort normal and breath sounds normal.  GI: Soft. Bowel sounds are normal.  Genitourinary: Vagina normal and uterus normal.  Musculoskeletal: Normal range of motion.  Neurological: She is alert and oriented to person, place, and time.  Skin: Skin is warm and dry.  Psychiatric: She has a normal mood and affect. Her behavior is normal. Judgment and thought content normal.    Prenatal labs: ABO, Rh: O/Positive/-- (01/03 0000) Antibody: Negative (01/03 0000) Rubella: Immune (01/03 0000) RPR: Nonreactive (01/03 0000)  HBsAg: Negative (01/03 0000)  HIV: Non-reactive (01/03 0000)  GBS: Positive (05/23 0000)   Assessment/Plan: 40 weeks.  AMA.  Active labor.  GBS positive.  Admit.  GBS prophylaxis.   Katherine [name] A 07/18/2015, 9:25 PM

## 2015-07-18 NOTE — Progress Notes (Signed)
I was present during the Epidural with Dr Desmond Lopeurk by Orlan LeavensViria Alvarez Spanish Interpeter

## 2015-07-18 NOTE — MAU Note (Signed)
Notified provider that patient is [birth year]/--2. Provider said to admit patient.

## 2015-07-18 NOTE — MAU Note (Signed)
PT SAYS  SHE IS AN INDUCTION  FOR   0700- TOMORROW.       SAYS  WAS HAVING UC  YESTERDAY-   AND BLEEDING.        TODAY  STRONGER- AT 1100-  WAS CLEANING - WENT TO B-ROOM - WHEN WIPED - SAW MUCUS  WITH BLOOD  AND WATER.   STILL FEELS  SOME  WATER COMING  OUT.      IN TRIAGE-  TOILET PAPER.        VE IN OFFICE ON Tuesday-      2 CM.    DENIES HSV AND MRSA.     HAS UTI- TAKING  ANTX.

## 2015-07-18 NOTE — Anesthesia Procedure Notes (Signed)
Epidural Patient location during procedure: OB  Staffing Anesthesiologist: Caysen Whang EDWARD Performed by: anesthesiologist   Preanesthetic Checklist Completed: patient identified, pre-op evaluation, timeout performed, IV checked, risks and benefits discussed and monitors and equipment checked  Epidural Patient position: sitting Prep: DuraPrep Patient monitoring: blood pressure and continuous pulse ox Approach: midline Location: L3-L4 Injection technique: LOR air  Needle:  Needle type: Tuohy  Needle gauge: 17 G Needle length: 9 cm Needle insertion depth: 6 cm Catheter size: 19 Gauge Catheter at skin depth: 11 cm Test dose: negative and Other (1% Lidocaine)  Additional Notes Patient identified.  Risk benefits discussed including failed block, incomplete pain control, headache, nerve damage, paralysis, blood pressure changes, nausea, vomiting, reactions to medication both toxic or allergic, and postpartum back pain.  Patient expressed understanding and wished to proceed.  All questions were answered.  Sterile technique used throughout procedure and epidural site dressed with sterile barrier dressing. No paresthesia or other complications noted. The patient did not experience any signs of intravascular injection such as tinnitus or metallic taste in mouth nor signs of intrathecal spread such as rapid motor block. Please see nursing notes for vital signs. Reason for block:procedure for pain   

## 2015-07-19 ENCOUNTER — Telehealth (HOSPITAL_COMMUNITY): Payer: Self-pay | Admitting: General Practice

## 2015-07-19 ENCOUNTER — Inpatient Hospital Stay (HOSPITAL_COMMUNITY): Admission: RE | Admit: 2015-07-19 | Payer: Self-pay | Source: Ambulatory Visit

## 2015-07-19 ENCOUNTER — Encounter (HOSPITAL_COMMUNITY): Payer: Self-pay

## 2015-07-19 LAB — RPR: RPR: NONREACTIVE

## 2015-07-19 MED ORDER — FLEET ENEMA 7-19 GM/118ML RE ENEM: 1.0000 | ENEMA | RECTAL | Status: DC | PRN

## 2015-07-19 MED ORDER — DIPHENHYDRAMINE HCL 25 MG PO CAPS: 25.0000 mg | ORAL_CAPSULE | Freq: Four times a day (QID) | ORAL | Status: DC | PRN

## 2015-07-19 MED ORDER — LACTATED RINGERS IV SOLN: 500.0000 mL | Freq: Once | INTRAVENOUS | Status: DC

## 2015-07-19 MED ORDER — ONDANSETRON HCL 4 MG/2ML IJ SOLN: 4.0000 mg | Freq: Four times a day (QID) | INTRAMUSCULAR | Status: DC | PRN

## 2015-07-19 MED ORDER — FENTANYL CITRATE (PF) 100 MCG/2ML IJ SOLN: 50.0000 ug | INTRAMUSCULAR | Status: DC | PRN

## 2015-07-19 MED ORDER — FENTANYL 2.5 MCG/ML BUPIVACAINE 1/10 % EPIDURAL INFUSION (WH - ANES): 14.0000 mL/h | INTRAMUSCULAR | Status: DC | PRN

## 2015-07-19 MED ORDER — SIMETHICONE 80 MG PO CHEW: 80.0000 mg | CHEWABLE_TABLET | ORAL | Status: DC | PRN

## 2015-07-19 MED ORDER — LIDOCAINE HCL (PF) 1 % IJ SOLN
30.0000 mL | INTRAMUSCULAR | Status: DC | PRN
  Filled 2015-07-19: qty 30

## 2015-07-19 MED ORDER — ACETAMINOPHEN 325 MG PO TABS
650.0000 mg | ORAL_TABLET | ORAL | Status: DC | PRN
  Administered 2015-07-19 – 2015-07-20 (×3): 650 mg via ORAL
  Filled 2015-07-19 (×3): qty 2

## 2015-07-19 MED ORDER — TETANUS-DIPHTH-ACELL PERTUSSIS 5-2.5-18.5 LF-MCG/0.5 IM SUSP: 0.5000 mL | Freq: Once | INTRAMUSCULAR | Status: DC

## 2015-07-19 MED ORDER — PHENYLEPHRINE 40 MCG/ML (10ML) SYRINGE FOR IV PUSH (FOR BLOOD PRESSURE SUPPORT): 80.0000 ug | PREFILLED_SYRINGE | INTRAVENOUS | Status: DC | PRN

## 2015-07-19 MED ORDER — COCONUT OIL OIL: 1.0000 "application " | TOPICAL_OIL | Status: DC | PRN

## 2015-07-19 MED ORDER — EPHEDRINE 5 MG/ML INJ
10.0000 mg | INTRAVENOUS | Status: DC | PRN
  Filled 2015-07-19: qty 2

## 2015-07-19 MED ORDER — WITCH HAZEL-GLYCERIN EX PADS: 1.0000 "application " | MEDICATED_PAD | CUTANEOUS | Status: DC | PRN

## 2015-07-19 MED ORDER — FERROUS SULFATE 325 (65 FE) MG PO TABS
325.0000 mg | ORAL_TABLET | Freq: Two times a day (BID) | ORAL | Status: DC
  Administered 2015-07-19 – 2015-07-20 (×2): 325 mg via ORAL
  Filled 2015-07-19 (×2): qty 1

## 2015-07-19 MED ORDER — ZOLPIDEM TARTRATE 5 MG PO TABS: 5.0000 mg | ORAL_TABLET | Freq: Every evening | ORAL | Status: DC | PRN

## 2015-07-19 MED ORDER — SOD CITRATE-CITRIC ACID 500-334 MG/5ML PO SOLN: 30.0000 mL | ORAL | Status: DC | PRN

## 2015-07-19 MED ORDER — SENNOSIDES-DOCUSATE SODIUM 8.6-50 MG PO TABS
2.0000 | ORAL_TABLET | ORAL | Status: DC
  Administered 2015-07-19: 2 via ORAL
  Filled 2015-07-19: qty 2

## 2015-07-19 MED ORDER — LACTATED RINGERS IV SOLN: 500.0000 mL | INTRAVENOUS | Status: DC | PRN

## 2015-07-19 MED ORDER — ONDANSETRON HCL 4 MG/2ML IJ SOLN: 4.0000 mg | INTRAMUSCULAR | Status: DC | PRN

## 2015-07-19 MED ORDER — FENTANYL 2.5 MCG/ML BUPIVACAINE 1/10 % EPIDURAL INFUSION (WH - ANES)
INTRAMUSCULAR | Status: AC
  Filled 2015-07-19: qty 125

## 2015-07-19 MED ORDER — DIBUCAINE 1 % RE OINT: 1.0000 "application " | TOPICAL_OINTMENT | RECTAL | Status: DC | PRN

## 2015-07-19 MED ORDER — DIPHENHYDRAMINE HCL 50 MG/ML IJ SOLN: 12.5000 mg | INTRAMUSCULAR | Status: DC | PRN

## 2015-07-19 MED ORDER — TERBUTALINE SULFATE 1 MG/ML IJ SOLN
0.2500 mg | Freq: Once | INTRAMUSCULAR | Status: DC | PRN
  Filled 2015-07-19: qty 1

## 2015-07-19 MED ORDER — PRENATAL MULTIVITAMIN CH
1.0000 | ORAL_TABLET | Freq: Every day | ORAL | Status: DC
  Administered 2015-07-20: 1 via ORAL
  Filled 2015-07-19 (×2): qty 1

## 2015-07-19 MED ORDER — BENZOCAINE-MENTHOL 20-0.5 % EX AERO
1.0000 "application " | INHALATION_SPRAY | CUTANEOUS | Status: DC | PRN
  Administered 2015-07-19: 1 via TOPICAL

## 2015-07-19 MED ORDER — SODIUM CHLORIDE 0.9 % IV SOLN
2.0000 g | Freq: Once | INTRAVENOUS | Status: DC
  Filled 2015-07-19: qty 2000

## 2015-07-19 MED ORDER — OXYTOCIN 40 UNITS IN LACTATED RINGERS INFUSION - SIMPLE MED: 2.5000 [IU]/h | INTRAVENOUS | Status: DC

## 2015-07-19 MED ORDER — ONDANSETRON HCL 4 MG PO TABS: 4.0000 mg | ORAL_TABLET | ORAL | Status: DC | PRN

## 2015-07-19 MED ORDER — LACTATED RINGERS IV SOLN: INTRAVENOUS | Status: DC

## 2015-07-19 MED ORDER — ACETAMINOPHEN 325 MG PO TABS: 650.0000 mg | ORAL_TABLET | ORAL | Status: DC | PRN

## 2015-07-19 MED ORDER — IBUPROFEN 600 MG PO TABS
600.0000 mg | ORAL_TABLET | Freq: Four times a day (QID) | ORAL | Status: DC
  Administered 2015-07-19 – 2015-07-20 (×5): 600 mg via ORAL
  Filled 2015-07-19 (×5): qty 1

## 2015-07-19 MED ORDER — DIPHENHYDRAMINE HCL 50 MG/ML IJ SOLN
12.5000 mg | INTRAMUSCULAR | Status: DC | PRN
Start: 2015-07-19 — End: 2015-07-19

## 2015-07-19 MED ORDER — PHENYLEPHRINE 40 MCG/ML (10ML) SYRINGE FOR IV PUSH (FOR BLOOD PRESSURE SUPPORT)
80.0000 ug | PREFILLED_SYRINGE | INTRAVENOUS | Status: DC | PRN
  Filled 2015-07-19: qty 5

## 2015-07-19 MED ORDER — EPHEDRINE 5 MG/ML INJ: 10.0000 mg | INTRAVENOUS | Status: DC | PRN

## 2015-07-19 MED ORDER — OXYTOCIN BOLUS FROM INFUSION: 500.0000 mL | INTRAVENOUS | Status: DC

## 2015-07-19 NOTE — Progress Notes (Signed)
I check on pt needs, I ordered her meals, by Orlan LeavensViria Alvarez Spanish Interpreter.

## 2015-07-19 NOTE — Progress Notes (Signed)
[name] NoeVeronica Garza-Cardoza is a 40 y.o. G6P5005 at 4542w2d by LMP admitted for active labor  Subjective:   Objective: BP 141/94 mmHg  Pulse 103  Temp(Src) 97.6 F (36.4 C) (Oral)  Resp 18  Ht 5' 0.5" (1.537 m)  Wt 220 lb (99.791 kg)  BMI 42.24 kg/m2  SpO2 100%  LMP 10/11/2014      FHT:  FHR: 130 bpm, variability: moderate,  accelerations:  Present,  decelerations:  Absent UC:   regular, every 2-3 minutes SVE:   Dilation: 10 Effacement (%): 100 Station: +1 Exam by:: McGraw-HillMurayyah Johnson   Labs: Lab Results  Component Value Date   WBC 14.2* 07/18/2015   HGB 11.7* 07/18/2015   HCT 34.5* 07/18/2015   MCV 78.4 07/18/2015   PLT 250 07/18/2015    Assessment / Plan: Spontaneous labor, progressing normally  Labor: Progressing normally Preeclampsia:  n/a Fetal Wellbeing:  good Pain Control:  Epidural I/D:  n/a Anticipated MOD:  NSVD  HARPER,CHARLES A 07/19/2015, 6:04 AM

## 2015-07-19 NOTE — Progress Notes (Signed)
I assisted Katherine [name] with admission and questions.  Eda H Royal Interpreter.

## 2015-07-19 NOTE — Anesthesia Postprocedure Evaluation (Signed)
Anesthesia Post Note  Patient: Katherine [name]  Procedure(s) Performed: * No procedures listed *  Patient location during evaluation: Mother Baby Anesthesia Type: Epidural Level of consciousness: awake and alert and oriented Pain management: satisfactory to patient Vital Signs Assessment: post-procedure vital signs reviewed and stable Respiratory status: spontaneous breathing and nonlabored ventilation Cardiovascular status: stable Postop Assessment: no headache, no backache, no signs of nausea or vomiting, adequate PO intake and patient able to bend at knees (patient up walking) Anesthetic complications: no     Last Vitals:  Filed Vitals:   07/19/15 1127 07/19/15 1548  BP: 102/60 115/60  Pulse: 69 67  Temp: 36.7 C 36.7 C  Resp: 18 17    Last Pain:  Filed Vitals:   07/19/15 1548  PainSc: 7    Pain Goal: Patients Stated Pain Goal: 2 (07/18/15 2145)               Madison HickmanGREGORY,Lastacia Solum

## 2015-07-19 NOTE — Lactation Note (Signed)
This note was copied from a baby's chart. Lactation Consultation Note Initial visit made.  Spanish breastfeeding consultation services and support information given to patient.  Mom states she has no milk and desires to both breast and formula feed.  Baby is currently 5 hours old and has attempted but not latched.  Explained supply and demand and importance of always putting baby to breast before giving bottle.  Hand expression shown to mom and 2 large drops obtained and given to baby by gloved finger.  Baby is sleeping in crib.  Instructed to call out for latch assist when baby starts to show feeding cues.  Patient Name: Katherine [name] ZOXWR'UToday's Date: 07/19/2015 Reason for consult: Initial assessment   Maternal Data Has patient been taught Hand Expression?: Yes Does the patient have breastfeeding experience prior to this delivery?: Yes  Feeding    LATCH Score/Interventions                      Lactation Tools Discussed/Used     Consult Status Consult Status: Follow-up Date: 07/20/15 Follow-up type: In-patient    Huston FoleyMOULDEN, Brithany Whitworth S 07/19/2015, 1:31 PM

## 2015-07-20 LAB — CBC
HCT: 29.3 % — ABNORMAL LOW (ref 36.0–46.0)
Hemoglobin: 9.7 g/dL — ABNORMAL LOW (ref 12.0–15.0)
MCH: 26.5 pg (ref 26.0–34.0)
MCHC: 33.1 g/dL (ref 30.0–36.0)
MCV: 80.1 fL (ref 78.0–100.0)
PLATELETS: 185 10*3/uL (ref 150–400)
RBC: 3.66 MIL/uL — AB (ref 3.87–5.11)
RDW: 15.1 % (ref 11.5–15.5)
WBC: 11 10*3/uL — AB (ref 4.0–10.5)

## 2015-07-20 NOTE — Clinical Social Work Maternal (Signed)
  CLINICAL SOCIAL WORK MATERNAL/CHILD NOTE  Patient Details  Name: Katherine [name] MRN: [id number] Date of Birth: Nov 12, 1975  Date:  07/20/2015  Clinical Social Worker Initiating Note:  Doreen SalvageGrier Segundo Makela Date/ Time Initiated:  07/20/15/1022     Child's Name:  Cynda AcresPaulina Cardoza   Legal Guardian:  Mother   Need for Interpreter:  Spanish   Date of Referral:  07/19/15     Reason for Referral:   h/o PPD  Referral Source:  Other (Comment) CN  Address:    [address] [place], [place] [address] Phone number:   [phone]   Household Members:    MOB, extended family and her children. MOB has 2 yo, 58 yo 39 yo in the home other children are grown.   Natural Supports (not living in the home):    FOB is supportive, but MOB reports they are not in relationship currently.   Professional Supports:   [employer] Program  Employment:   FOB employed    Cultural/Religious Considerations Which May Impact Care:  MOB isSpanish speaking, interpreter assisted with this assessment.   Strengths:    MOB well prepared for baby, carseat, stroller visualized in room . MOB reports to need a bassinet, but has the means to purchase per her report.   Risk Factors/Current Problems:    H/O depression. Pt reports 10yo son was born with a cleft lip and this led to short episode of depression. She reports this resolved on it's own and she did not need medication or counseling. She denies current depression or anxiety.   Cognitive State:    Normal  Mood/Affect:    Normal, holding baby, bonding observed.   CSW Assessment: MOB reports she had brief episode of PPD after her her 39 yo son was born due to his cleft lip. She reports this resolved on it's own and she did not need medication or counseling. She denies current depression or anxiety. Pt reports to have good support to assist. CSW reviewed baby blues vs. PPD/Depression. CSW provided pt with education hand out on this in [place]. Pt had no questions or other  concerns and feels she can reach out for additional support/help as needed. Pt has transportation to appointments and most items in place for baby.    CSW Plan/Description:    No barriers for discharge noted.    Vennie HomansHock, Charleigh Correnti Grier, [place] 07/20/2015, 10:23 AM

## 2015-07-20 NOTE — Progress Notes (Signed)
Used in house interpreter to order breakfast, to discuss breastfeeding and to ask if pt had questions.

## 2015-07-20 NOTE — Progress Notes (Signed)
Patient ID: [name], female   DOB: 11/29/1975, 40 y.o.   MRN: [id number] Postpartum day one Blood pressure 9458 pulse 63 respiration 18 Fundus firm Lochia moderate Legs negative doing well wants early discharge

## 2015-07-20 NOTE — Discharge Instructions (Signed)
if  bleeding more than a period notify me  °No sex for 3 weeks °See me in 3 weeks  °Resume normal activity in am °

## 2015-07-20 NOTE — Discharge Summary (Signed)
Obstetric Discharge Summary Reason for Admission: onset of labor Prenatal Procedures: none Intrapartum Procedures: spontaneous vaginal delivery Postpartum Procedures: none Complications-Operative and Postpartum: none HEMOGLOBIN  Date Value Ref Range Status  07/18/2015 11.7* 12.0 - 15.0 g/dL Final   HCT  Date Value Ref Range Status  07/18/2015 34.5* 36.0 - 46.0 % Final    Physical Exam:  General: alert Lochia: appropriate Uterine Fundus: firm Incision: healing well DVT Evaluation: No evidence of DVT seen on physical exam.  Discharge Diagnoses: Term Pregnancy-delivered  Discharge Information: Date: 07/20/2015 Activity: unrestricted Diet: routine Medications: Tylenol #3 Condition: improved Instructions: refer to practice specific booklet Discharge to: home Follow-up Information    Follow up with HARPER,CHARLES A, MD.   Specialty:  Obstetrics and Gynecology   Contact information:   [address] [place] [address] [phone]       Newborn Data: Live born female  Birth Weight: 7 lb 10.1 oz (3460 g) APGAR: 9, 9  Home with mother.  Katherine [name] 07/20/2015, 6:13 AM

## 2015-08-30 ENCOUNTER — Encounter: Payer: Self-pay | Admitting: Certified Nurse Midwife

## 2015-08-30 ENCOUNTER — Ambulatory Visit (INDEPENDENT_AMBULATORY_CARE_PROVIDER_SITE_OTHER): Payer: Self-pay | Admitting: Certified Nurse Midwife

## 2015-08-30 DIAGNOSIS — Z3009 Encounter for other general counseling and advice on contraception: Secondary | ICD-10-CM

## 2015-08-30 NOTE — Progress Notes (Signed)
Subjective:     Katherine [name] is a 40 y.o. female who presents for a postpartum visit. She is 6 weeks postpartum following a spontaneous vaginal delivery. I have fully reviewed the prenatal and intrapartum course. The delivery was at 40 gestational weeks. Outcome: spontaneous vaginal delivery. Anesthesia: epidural. Postpartum course has been normal. Baby's course has been normal. Baby is feeding by both breast and bottle - Similac Advance. Bleeding no bleeding. Bowel function is normal. Bladder function is normal. Patient is not sexually active. Contraception method is abstinence. Postpartum depression screening: negative.  Tobacco, alcohol and substance abuse history reviewed.  Adult immunizations reviewed including TDAP, rubella and varicella.  The following portions of the patient's history were reviewed and updated as appropriate: allergies, current medications, past family history, past medical history, past social history, past surgical history and problem list.  Review of Systems A comprehensive review of systems was negative.   Objective:    BP 130/74   Pulse (!) 51   Temp 98 F (36.7 C)   Wt 203 lb (92.1 kg)   BMI 38.99 kg/m   General:  alert and no distress   Breasts:  inspection negative, no nipple discharge or bleeding, no masses or nodularity palpable  Lungs: clear to auscultation bilaterally  Heart:  regular rate and rhythm, S1, S2 normal, no murmur, click, rub or gallop  Abdomen: soft, non-tender; bowel sounds normal; no masses,  no organomegaly   Vulva:  normal  Vagina: normal vagina  Cervix:  no cervical motion tenderness  Corpus: normal size, contour, position, consistency, mobility, non-tender  Adnexa:  no mass, fullness, tenderness  Rectal Exam: Not performed.         Assessment:     Normal postpartum exam. Pap smear not done at today's visit.     Contraceptive counseling and advice.  Wants tubal but has no insurance.   Plan:  1. Contraception:  Considering ParaGuard IUD. 2. Contraceptive options discussed 3. Follow up in: several weeks or as needed.   Healthy lifestyle practices reviewed

## 2015-10-12 ENCOUNTER — Encounter (HOSPITAL_COMMUNITY): Payer: Self-pay

## 2015-10-12 ENCOUNTER — Emergency Department (HOSPITAL_COMMUNITY)
Admission: EM | Admit: 2015-10-12 | Discharge: 2015-10-12 | Disposition: A | Discharge status: Home / Self Care | Payer: Medicaid Other | Attending: Emergency Medicine | Admitting: Emergency Medicine

## 2015-10-12 DIAGNOSIS — H66002 Acute suppurative otitis media without spontaneous rupture of ear drum, left ear: Secondary | ICD-10-CM | POA: Insufficient documentation

## 2015-10-12 DIAGNOSIS — H6092 Unspecified otitis externa, left ear: Secondary | ICD-10-CM | POA: Insufficient documentation

## 2015-10-12 MED ORDER — AMOXICILLIN 500 MG PO CAPS: 500.0000 mg | ORAL_CAPSULE | Freq: Three times a day (TID) | ORAL | 0 refills | Status: DC

## 2015-10-12 MED ORDER — CIPROFLOXACIN-DEXAMETHASONE 0.3-0.1 % OT SUSP: 4.0000 [drp] | Freq: Two times a day (BID) | OTIC | 0 refills | Status: DC

## 2015-10-12 MED ORDER — ACETAMINOPHEN 500 MG PO TABS
1000.0000 mg | ORAL_TABLET | Freq: Once | ORAL | Status: AC
  Administered 2015-10-12: 1000 mg via ORAL
  Filled 2015-10-12: qty 2

## 2015-10-12 NOTE — ED Triage Notes (Signed)
Patient complains of left ear pain x 2 days, no other complaints

## 2015-10-12 NOTE — ED Provider Notes (Signed)
[facility]-EMERGENCY DEPT Provider Note   CSN: [id number] Arrival date & time: 10/12/15  1718  By signing my name below, I, Katherine [name], attest that this documentation has been prepared under the direction and in the presence of non-physician practitioner, Roxy Horseman, PA-C. Electronically Signed: Modena [name], Scribe. 10/12/2015. 5:38 PM  History   Chief Complaint Chief Complaint  Patient presents with  . Otalgia   The history is provided by the patient and a relative. No language interpreter was used.   HPI Comments: Katherine [name] is a 40 y.o. female who presents to the Emergency Department complaining of constant moderate left ear pain that started 2 days ago.  She has not taken anything for her symptoms.  The pain radiates to her left face. Reports no modifying factors. Denies any prior hx of ear ache, recent swimming, hx of DM, fever, or sore throat.   Past Medical History:  Diagnosis Date  . Anemia   . Depression    postpartum depression  . Splenomegaly     Patient Active Problem List   Diagnosis Date Noted  . NVD (normal vaginal delivery) 07/19/2015  . Normal labor and delivery 07/19/2015  . Active labor at term 07/18/2015  . Periumbilical abdominal pain 07/31/2013  . Spleen enlarged 07/31/2013  . History of anemia 07/31/2013  . Dental caries 09/06/2012    Past Surgical History:  Procedure Laterality Date  . NO PAST SURGERIES      OB History    Gravida Para Term Preterm AB Living   6 6 6     6    SAB TAB Ectopic Multiple Live Births         0 6       Home Medications    Prior to Admission medications   Not on File    Family History Family History  Problem Relation Age of Onset  . Diabetes Father   . Kidney disease Brother     Social History Social History  Substance Use Topics  . Smoking status: Never Smoker  . Smokeless tobacco: Never Used  . Alcohol use No     Allergies   Review of patient's allergies indicates no known  allergies.   Review of Systems Review of Systems  Constitutional: Negative for fever.  HENT: Positive for ear pain. Negative for sore throat.   All other systems reviewed and are negative.    Physical Exam Updated Vital Signs BP 124/78 (BP Location: Left Arm)   Pulse 70   Temp 97.5 F (36.4 C) (Oral)   Resp 16   SpO2 100%   Physical Exam  Constitutional: She is oriented to person, place, and time. She appears well-developed and well-nourished. No distress.  HENT:  Head: Normocephalic and atraumatic.  Left ear canal is edematous with debris and erythema. Left TM is bulging, erythematous, and purulent.  Eyes: Conjunctivae are normal.  Neck: Neck supple.  Cardiovascular: Normal rate and regular rhythm.   Pulmonary/Chest: Effort normal. No respiratory distress. She has no wheezes. She has no rales.  Abdominal: Soft.  Musculoskeletal: Normal range of motion.  Neurological: She is alert and oriented to person, place, and time.  Skin: Skin is warm and dry.  Psychiatric: She has a normal mood and affect.  Nursing note and vitals reviewed.    ED Treatments / Results  DIAGNOSTIC STUDIES: Oxygen Saturation is 100% on RA, normal by my interpretation.   COORDINATION OF CARE: 5:42 PM- Pt advised of plan for treatment and pt agrees.  Labs (  all labs ordered are listed, but only abnormal results are displayed) Labs Reviewed - No data to display  EKG  EKG Interpretation None       Radiology No results found.  Procedures Procedures (including critical care time)  Medications Ordered in ED Medications - No data to display   Initial Impression / Assessment and Plan / ED Course  I have reviewed the triage vital signs and the nursing notes.  Pertinent labs & imaging results that were available during my care of the patient were reviewed by me and considered in my medical decision making (see chart for details).  Clinical Course     Patient presents with otalgia and  exam consistent with acute otitis media. No concern for acute mastoiditis, meningitis.  No antibiotic use in the last month.  Patient discharged home with Amoxicillin. Also evidence to otitis externa.  Will give ciprodex.  Patient told to pump and waste breast milk if using ciprodex. I have discussed reasons to return immediately to the ER.  Pt expresses understanding and agrees with plan.  Final Clinical Impressions(s) / ED Diagnoses   Final diagnoses:  Otitis externa, left  Acute suppurative otitis media of left ear without spontaneous rupture of tympanic membrane, recurrence not specified    New Prescriptions Discharge Medication List as of 10/12/2015  5:48 PM    START taking these medications   Details  amoxicillin (AMOXIL) 500 MG capsule Take 1 capsule (500 mg total) by mouth 3 (three) times daily., Starting Sat 10/12/2015, Print    ciprofloxacin-dexamethasone (CIPRODEX) otic suspension Place 4 drops into the left ear 2 (two) times daily., Starting Sat 10/12/2015, Print       I personally performed the services described in this documentation, which was scribed in my presence. The recorded information has been reviewed and is accurate.       Roxy HorsemanRobert Mattheo Swindle, PA-C 10/12/15 1800    Donnetta HutchingBrian Cook, MD 10/12/15 2207

## 2015-12-06 ENCOUNTER — Ambulatory Visit: Payer: Medicaid Other | Attending: Internal Medicine | Admitting: Physician Assistant

## 2015-12-06 ENCOUNTER — Encounter: Payer: Self-pay | Admitting: Physician Assistant

## 2015-12-06 VITALS — BP 111/73 | HR 53 | Temp 98.6°F | Resp 16 | Wt 217.0 lb

## 2015-12-06 DIAGNOSIS — X58XXXA Exposure to other specified factors, initial encounter: Secondary | ICD-10-CM | POA: Insufficient documentation

## 2015-12-06 DIAGNOSIS — M62838 Other muscle spasm: Secondary | ICD-10-CM

## 2015-12-06 DIAGNOSIS — Z79899 Other long term (current) drug therapy: Secondary | ICD-10-CM | POA: Insufficient documentation

## 2015-12-06 DIAGNOSIS — H66012 Acute suppurative otitis media with spontaneous rupture of ear drum, left ear: Secondary | ICD-10-CM

## 2015-12-06 DIAGNOSIS — S161XXA Strain of muscle, fascia and tendon at neck level, initial encounter: Secondary | ICD-10-CM

## 2015-12-06 DIAGNOSIS — F329 Major depressive disorder, single episode, unspecified: Secondary | ICD-10-CM | POA: Insufficient documentation

## 2015-12-06 MED ORDER — METHOCARBAMOL 500 MG PO TABS: 500.0000 mg | ORAL_TABLET | Freq: Four times a day (QID) | ORAL | 0 refills | Status: DC

## 2015-12-06 MED ORDER — FLUCONAZOLE 150 MG PO TABS: 150.0000 mg | ORAL_TABLET | Freq: Every day | ORAL | 0 refills | Status: DC

## 2015-12-06 MED ORDER — OFLOXACIN 0.3 % OT SOLN: 5.0000 [drp] | Freq: Every day | OTIC | 0 refills | Status: DC

## 2015-12-06 MED ORDER — NAPROXEN 500 MG PO TABS: 500.0000 mg | ORAL_TABLET | Freq: Two times a day (BID) | ORAL | 0 refills | Status: DC

## 2015-12-06 MED ORDER — AMOXICILLIN-POT CLAVULANATE 875-125 MG PO TABS: 1.0000 | ORAL_TABLET | Freq: Two times a day (BID) | ORAL | 0 refills | Status: DC

## 2015-12-06 MED FILL — FLUCONAZOLE 150 MG TABLET: 150 | 1 days supply | Qty: 1 | Fill #0

## 2015-12-06 MED FILL — METHOCARBAMOL 500 MG TABLET: 500 | 22 days supply | Qty: 90 | Fill #0

## 2015-12-06 MED FILL — OFLOXACIN 0.3% EAR DROPS: 0.3 | 22 days supply | Qty: 5 | Fill #0

## 2015-12-06 MED FILL — AMOX-CLAV 875-125 MG TABLET: 875-125 | 10 days supply | Qty: 20 | Fill #0

## 2015-12-06 MED FILL — NAPROXEN 500 MG TABLET: 500 | 30 days supply | Qty: 60 | Fill #0

## 2015-12-06 NOTE — Progress Notes (Signed)
Katherine [name], is a 40 y.o. female  ZOX:[id number]  [id number]  DOB - March 17, 1975  Subjective:  Chief Complaint and HPI: Katherine [name] is a 40 y.o. female here today for pain in the R neck and shoulder region X 1 month.  NKI.  She does have a 624 month old baby that she is carrying with carrier and is R handed.  This is the only new/different activity.  She denies CP/SOB.  No numbness/weakness.  She has not tried any OTC meds.  She is nursing her 344 month old infant. Also, she was seen in the ED 10/12/2015 for otitis media.  She did not get the ear drops filled bc they were too expensive.  Her daughter is interpreting.  Her improved but she does continue to have intermittent sharp pain and a foul-smelling drainage that is occurring intermittently.  No f/c.  ED/Hospital notes reviewed.     ROS:   Constitutional:  No f/c, No night sweats, No unexplained weight loss. EENT:  No vision changes, No blurry vision, No hearing changes. No mouth, throat problems. +L ear pain Respiratory: No cough, No SOB Cardiac: No CP, no palpitations GI:  No abd pain, No N/V/D. GU: No Urinary s/sx Musculoskeletal: No joint pain Neuro: No headache, no dizziness, no motor weakness.  Skin: No rash Endocrine:  No polydipsia. No polyuria.  Psych: Denies SI/HI  No problems updated.  ALLERGIES: No Known Allergies  PAST MEDICAL HISTORY: Past Medical History:  Diagnosis Date  . Anemia   . Depression    postpartum depression  . Splenomegaly     MEDICATIONS AT HOME: Prior to Admission medications   Medication Sig Start Date End Date Taking? Authorizing Provider  amoxicillin-clavulanate (AUGMENTIN) 875-125 MG tablet Take 1 tablet by mouth 2 (two) times daily. 12/06/15   Anders SimmondsAngela M McClung, PA-C  fluconazole (DIFLUCAN) 150 MG tablet Take 1 tablet (150 mg total) by mouth daily. If you develop vaginal itching/yeast infection 12/06/15   Anders SimmondsAngela M McClung, PA-C  methocarbamol (ROBAXIN) 500 MG  tablet Take 1 tablet (500 mg total) by mouth 4 (four) times daily. X10 days then prn muscle spasm 12/06/15   Anders SimmondsAngela M McClung, PA-C  naproxen (NAPROSYN) 500 MG tablet Take 1 tablet (500 mg total) by mouth 2 (two) times daily with a meal. X 10 days then prn pain 12/06/15   Anders SimmondsAngela M McClung, PA-C  ofloxacin (FLOXIN) 0.3 % otic solution Place 5 drops into the left ear daily. 12/06/15   Anders SimmondsAngela M McClung, PA-C     Objective:  EXAM:   Vitals:   12/06/15 0946  BP: 111/73  Pulse: (!) 53  Resp: 16  Temp: 98.6 F (37 C)  TempSrc: Oral  SpO2: 96%  Weight: 217 lb (98.4 kg)    General appearance : A&OX3. NAD. Non-toxic-appearing HEENT: Atraumatic and Normocephalic.  PERRLA. EOM intact.  TM clear on R.  L TM is distorted.  There is erythema of the TM and yellowish thick fluid present behind the TM.  I am unable to see an obvious perforation. Mouth-MMM, post pharynx WNL w/o erythema, No PND. Neck: supple, no JVD. No cervical lymphadenopathy. No thyromegaly Chest/Lungs:  Breathing-non-labored, Good air entry bilaterally, breath sounds normal without rales, rhonchi, or wheezing  CVS: S1 S2 regular, no murmurs, gallops, rubs  Back/neck-full S&ROM.  There is TTP and spasm along the trapezius on the R.  Shoulder/arm/wrist/hand all normal. Extremities: Bilateral Lower Ext shows no edema, both legs are warm to touch with = pulse throughout.  DTR=B U&L extremities Neurology:  CN II-XII grossly intact, Non focal.   Psych:  TP linear. J/I WNL. Normal speech. Appropriate eye contact and affect.  Skin:  No Rash  Data Review No results found for: HGBA1C   Assessment & Plan   1. Acute suppurative otitis media of left ear with spontaneous rupture of tympanic membrane, recurrence not specified - ofloxacin (FLOXIN) 0.3 % otic solution; Place 5 drops into the left ear daily.  Dispense: 5 mL; Refill: 0 - amoxicillin-clavulanate (AUGMENTIN) 875-125 MG tablet; Take 1 tablet by mouth 2 (two) times daily.   Dispense: 20 tablet; Refill: 0 Consider ENT referral if not completely back to normal at next OV.  She is making an appointment to have her orange card renewed.  2. Muscle spasm - methocarbamol (ROBAXIN) 500 MG tablet; Take 1 tablet (500 mg total) by mouth 4 (four) times daily. X10 days then prn muscle spasm  Dispense: 90 tablet; Refill: 0  3. Strain of neck muscle, initial encounter - naproxen (NAPROSYN) 500 MG tablet; Take 1 tablet (500 mg total) by mouth 2 (two) times daily with a meal. X 10 days then prn pain  Dispense: 60 tablet; Refill: 0  "Pump and dump" breast milk while on meds.   Patient have been counseled extensively about nutrition and exercise  Return in about 3 weeks (around 12/27/2015) for f/up with me for muscle spasm and ear infection.  The patient was given clear instructions to go to ER or return to medical center if symptoms don't improve, worsen or new problems develop. The patient verbalized understanding. The patient was told to call to get lab results if they haven't heard anything in the next week.     Georgian CoAngela McClung, PA-C [facility] [place], [place] [phone]   12/06/2015, 10:14 AMPatient ID: Katherine [name], female   DOB: 16-Jul-1975, 40 y.o.   MRN: [id number]

## 2015-12-06 NOTE — Progress Notes (Signed)
Pt is in the office today for back and ear pain Pt states her back has been hurting for a month Pt states her left ear is hurting Pt states she has been taking tylenol but it hasn't helped any

## 2016-03-13 ENCOUNTER — Ambulatory Visit: Payer: Self-pay | Attending: Internal Medicine

## 2016-04-01 ENCOUNTER — Ambulatory Visit: Payer: Self-pay | Admitting: Internal Medicine

## 2016-04-16 ENCOUNTER — Ambulatory Visit: Payer: Self-pay | Attending: Internal Medicine

## 2016-04-22 ENCOUNTER — Ambulatory Visit: Payer: Self-pay | Admitting: Internal Medicine

## 2016-05-06 ENCOUNTER — Ambulatory Visit: Payer: Self-pay | Admitting: Internal Medicine

## 2016-05-08 ENCOUNTER — Encounter: Payer: Self-pay | Admitting: Physician Assistant

## 2016-05-08 ENCOUNTER — Ambulatory Visit: Payer: Self-pay | Attending: Internal Medicine | Admitting: Physician Assistant

## 2016-05-08 VITALS — BP 123/74 | HR 60 | Temp 98.2°F | Resp 16 | Wt 214.4 lb

## 2016-05-08 DIAGNOSIS — R635 Abnormal weight gain: Secondary | ICD-10-CM | POA: Insufficient documentation

## 2016-05-08 DIAGNOSIS — X58XXXA Exposure to other specified factors, initial encounter: Secondary | ICD-10-CM | POA: Insufficient documentation

## 2016-05-08 DIAGNOSIS — Z79899 Other long term (current) drug therapy: Secondary | ICD-10-CM | POA: Insufficient documentation

## 2016-05-08 DIAGNOSIS — S161XXA Strain of muscle, fascia and tendon at neck level, initial encounter: Secondary | ICD-10-CM | POA: Insufficient documentation

## 2016-05-08 DIAGNOSIS — M79602 Pain in left arm: Secondary | ICD-10-CM | POA: Insufficient documentation

## 2016-05-08 DIAGNOSIS — Z131 Encounter for screening for diabetes mellitus: Secondary | ICD-10-CM | POA: Insufficient documentation

## 2016-05-08 DIAGNOSIS — M5489 Other dorsalgia: Secondary | ICD-10-CM | POA: Insufficient documentation

## 2016-05-08 DIAGNOSIS — M62838 Other muscle spasm: Secondary | ICD-10-CM | POA: Insufficient documentation

## 2016-05-08 DIAGNOSIS — M549 Dorsalgia, unspecified: Secondary | ICD-10-CM

## 2016-05-08 DIAGNOSIS — F329 Major depressive disorder, single episode, unspecified: Secondary | ICD-10-CM | POA: Insufficient documentation

## 2016-05-08 DIAGNOSIS — F419 Anxiety disorder, unspecified: Secondary | ICD-10-CM | POA: Insufficient documentation

## 2016-05-08 LAB — POCT GLYCOSYLATED HEMOGLOBIN (HGB A1C): HEMOGLOBIN A1C: 5.4

## 2016-05-08 MED ORDER — METHOCARBAMOL 500 MG PO TABS: 500.0000 mg | ORAL_TABLET | Freq: Four times a day (QID) | ORAL | 0 refills | Status: DC

## 2016-05-08 MED ORDER — NAPROXEN 500 MG PO TABS: 500.0000 mg | ORAL_TABLET | Freq: Two times a day (BID) | ORAL | 0 refills | Status: DC

## 2016-05-08 MED FILL — NAPROXEN 500 MG TABLET: 500 | 30 days supply | Qty: 60 | Fill #0

## 2016-05-08 MED FILL — METHOCARBAMOL 500 MG TABLET: 500 | 23 days supply | Qty: 90 | Fill #0

## 2016-05-08 NOTE — Progress Notes (Signed)
Katherine [name], is a 41 y.o. female  AVW:[id number]  [id number]  DOB - July 30, 1975  Subjective:  Chief Complaint and HPI: Katherine [name] is a 41 y.o. female here today for L upper arm and L upper back pain that has been occurring for about 2 weeks since doing a lot of spring cleaning. She is L hand dominant.  She denies numbness or weakness.   She is also experiencing stress/anxiety and that she thinks she is eating too much due to stress.    ROS:   Constitutional:  No f/c, No night sweats, No unexplained weight loss. EENT:  No vision changes, No blurry vision, No hearing changes. No mouth, throat, or ear problems.  Respiratory: No cough, No SOB Cardiac: No CP, no palpitations GI:  No abd pain, No N/V/D. GU: No Urinary s/sx Musculoskeletal: No joint pain, +muscle soreness. Neuro: No headache, no dizziness, no motor weakness.  Skin: No rash Endocrine:  No polydipsia. No polyuria.  Psych: Denies SI/HI  No problems updated.  ALLERGIES: No Known Allergies  PAST MEDICAL HISTORY: Past Medical History:  Diagnosis Date  . Anemia   . Depression    postpartum depression  . Splenomegaly     MEDICATIONS AT HOME: Prior to Admission medications   Medication Sig Start Date End Date Taking? Authorizing Provider  amoxicillin-clavulanate (AUGMENTIN) 875-125 MG tablet Take 1 tablet by mouth 2 (two) times daily. 12/06/15   Katherine Simmonds, PA-C  fluconazole (DIFLUCAN) 150 MG tablet Take 1 tablet (150 mg total) by mouth daily. If you develop vaginal itching/yeast infection 12/06/15   Katherine Simmonds, PA-C  methocarbamol (ROBAXIN) 500 MG tablet Take 1 tablet (500 mg total) by mouth 4 (four) times daily. X10 days then prn muscle spasm 05/08/16   Katherine Simmonds, PA-C  naproxen (NAPROSYN) 500 MG tablet Take 1 tablet (500 mg total) by mouth 2 (two) times daily with a meal. X 10 days then prn pain 05/08/16   Katherine Simmonds, PA-C  ofloxacin (FLOXIN) 0.3 % otic solution  Place 5 drops into the left ear daily. 12/06/15   Katherine Simmonds, PA-C     Objective:  EXAM:   Vitals:   05/08/16 0910  BP: 123/74  Pulse: 60  Resp: 16  Temp: 98.2 F (36.8 C)  TempSrc: Oral  SpO2: 97%  Weight: 214 lb 6.4 oz (97.3 kg)    General appearance : A&OX3. NAD. Non-toxic-appearing HEENT: Atraumatic and Normocephalic.  PERRLA. EOM intact.  TM clear B. Mouth-MMM, post pharynx WNL w/o erythema, No PND. Neck: supple, no JVD. No cervical lymphadenopathy. No thyromegaly Chest/Lungs:  Breathing-non-labored, Good air entry bilaterally, breath sounds normal without rales, rhonchi, or wheezing  CVS: S1 S2 regular, no murmurs, gallops, rubs  Extremities: Bilateral UE with sensory and motor in tact with normal grip and ROM form all joints.  There is TTP and spasm in her L trapezius region.  UE DTR=B.  Lower Ext shows no edema, both legs are warm to touch with = pulse throughout Neurology:  CN II-XII grossly intact, Non focal.   Psych:  TP linear. J/I WNL. Normal speech. Appropriate eye contact and affect.  Skin:  No Rash  Data Review No results found for: HGBA1C   Assessment & Plan   1. Weight gain - TSH  2. Left arm pain - methocarbamol (ROBAXIN) 500 MG tablet; Take 1 tablet (500 mg total) by mouth 4 (four) times daily. X10 days then prn muscle spasm  Dispense: 90 tablet; Refill: 0 -  naproxen (NAPROSYN) 500 MG tablet; Take 1 tablet (500 mg total) by mouth 2 (two) times daily with a meal. X 10 days then prn pain  Dispense: 60 tablet; Refill: 0  3. Upper back pain on left side - methocarbamol (ROBAXIN) 500 MG tablet; Take 1 tablet (500 mg total) by mouth 4 (four) times daily. X10 days then prn muscle spasm  Dispense: 90 tablet; Refill: 0 - naproxen (NAPROSYN) 500 MG tablet; Take 1 tablet (500 mg total) by mouth 2 (two) times daily with a meal. X 10 days then prn pain  Dispense: 60 tablet; Refill: 0  4. Anxiety Information given - Vitamin D, 25-hydroxy  5. Muscle  spasm - methocarbamol (ROBAXIN) 500 MG tablet; Take 1 tablet (500 mg total) by mouth 4 (four) times daily. X10 days then prn muscle spasm  Dispense: 90 tablet; Refill: 0  6. Strain of neck muscle, initial encounter - naproxen (NAPROSYN) 500 MG tablet; Take 1 tablet (500 mg total) by mouth 2 (two) times daily with a meal. X 10 days then prn pain  Dispense: 60 tablet; Refill: 0  Patient have been counseled extensively about nutrition and exercise  Return in about 2 weeks (around 05/22/2016) for assign PCP; discuss anxiety and weight gain; f/up upper back pain.  The patient was given clear instructions to go to ER or return to medical center if symptoms don't improve, worsen or new problems develop. The patient verbalized understanding. The patient was told to call to get lab results if they haven't heard anything in the next week.     Katherine Co, PA-C [facility] [place], [place] [phone]   05/08/2016, 9:20 AMPatient ID: Katherine [name], female   DOB: 01/21/1976, 41 y.o.   MRN: [id number]

## 2016-05-08 NOTE — Patient Instructions (Addendum)
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 Interfiere en las funciones vitales, incluyendo las Anon Raices, el trabajo y la escuela.  Es diferente de la ansiedad normal que todas las personas experimentan en algn momento de su vida en respuesta a sucesos y Chief Operating Officer. En verdad, la ansiedad normal nos ayuda a prepararnos y Human resources officer acontecimientos y actividades de la vida. La ansiedad normal desaparece despus de que el evento o la actividad ha finalizado.  El trastorno de ansiedad generalizada no est necesariamente relacionada con eventos o actividades especficas. Tambin causa un exceso de ansiedad en proporcin a sucesos o actividades especficas. En este trastorno la ansiedad es difcil de Chief Operating Officer. Los sntomas pueden variar de leves a muy graves. Las personas que sufren de trastorno de ansiedad generalizada pueden tener intensas olas de ansiedad con sntomas fsicos (ataques de pnico).  SNTOMAS  La ansiedad y la preocupacin asociada a este trastorno son difciles de Chief Operating Officer. Esta ansiedad y la preocupacin estn relacionados con muchos eventos de la vida y sus actividades y tambin ocurre durante ms [employer] que no ocurre, durante 6 meses o ms. Las personas que la sufren pueden tener tres o ms de los siguientes sntomas (uno o ms en los nios):   Glass blower/designer.  Dificultades de concentracin.   Irritabilidad.  Tensin muscular  Dificultad para dormirse o sueo poco satisfactorio. DIAGNSTICO  Se diagnostica a travs de una evaluacin realizada por el mdico. El mdico le har preguntas acerca de su estado de nimo, sntomas fsicos y sucesos de [place] vida. Le har preguntas sobre su historia clnica, el consumo de alcohol o drogas, incluyendo los medicamentos recetados. [employer] un examen fsico e indicar anlisis de [place]. Ciertas enfermedades y el uso de  determinadas sustancias pueden causar sntomas similares a este trastorno. Su mdico lo puede derivar a Music therapist en salud mental para una evaluacin ms profunda.Katherine [name]  Las terapias siguientes se utilizan en el tratamiento de este trastorno:   Medicamentos - Se recetan antidepresivos para el control diario a Air cabin crew. Pueden indicarse tambin medicamentos para combatir la [employer] graves, especialmente cuando ocurren ataques de pnico.   Terapia conversada (psicoterapia) Ciertos tipos de psicoterapia pueden ser tiles en el tratamiento del trastorno de ansiedad generalizada, proporcionando apoyo, educacin y Optometrist. Una forma de psicoterapia llamada terapia cognitivo-conductual puede ensearle formas saludables de pensar y Publishing rights manager a los eventos y actividades de la vida diaria.  Tcnicasde manejo del estrs- Estas tcnicas incluyen el yoga, la meditacin y el ejercicio y pueden ser muy tiles cuando se practican con regularidad. Un especialista en salud mental puede ayudar a determinar qu tratamiento es mejor para usted. Algunas personas obtienen mejora con una terapia. Sin embargo, Economist requieren una combinacin de terapias.  Esta informacin no tiene Theme park manager el consejo del mdico. Asegrese de hacerle al mdico cualquier pregunta que tenga. Document Released: 05/09/2012 Document Revised: 02/02/2014 Elsevier Interactive Patient Education  2017 Elsevier Inc.   Preventing Unhealthy [employer], Adult Staying at a healthy weight is important. When fat builds up in your body, you may become overweight or obese. These conditions put you at greater risk for developing certain health problems, such as heart disease, diabetes, sleeping problems, joint problems, and some cancers. Unhealthy weight gain is often the result of making unhealthy choices in what you eat. It is also a result of not getting enough exercise. You can  make changes to  your lifestyle to prevent obesity and stay as healthy as possible. What nutrition changes can be made? To maintain a healthy weight and prevent obesity:  Eat only as much as your body needs. To do this:  Pay attention to signs that you are hungry or full. Stop eating as soon as you feel full.  If you feel hungry, try drinking water first. Drink enough water so your urine is clear or pale yellow.  Eat smaller portions.  Look at serving sizes on food labels. Most foods contain more than one serving per container.  Eat the recommended amount of calories for your gender and activity level. While most active people should eat around 2,000 calories per day, if you are trying to lose weight or are not very active, you main need to eat less calories. Talk to your health care provider or dietitian about how many calories you should eat each day.  Choose healthy foods, such as:  Fruits and vegetables. Try to fill at least half of your plate at each meal with fruits and vegetables.  Whole grains, such as whole wheat bread, brown rice, and quinoa.  Lean meats, such as chicken or fish.  Other healthy proteins, such as beans, eggs, or tofu.  Healthy fats, such as nuts, seeds, fatty fish, and olive oil.  Low-fat or fat-free dairy.  Check food labels and avoid food and drinks that:  Are high in calories.  Have added sugar.  Are high in sodium.  Have saturated fats or trans fats.  Limit how much you eat of the following foods:  Prepackaged meals.  Fast food.  Fried foods.  Processed meat, such as bacon, sausage, and deli meats.  Fatty cuts of red meat and poultry with skin.  Cook foods in healthier ways, such as by baking, broiling, or grilling.  When grocery shopping, try to shop around the outside of the store. This helps you buy mostly fresh foods and avoid canned and prepackaged foods. What lifestyle changes can be made?  Exercise at least 30 minutes 5 or more days each  week. Exercising includes brisk walking, yard work, biking, running, swimming, and team sports like basketball and soccer. Ask your health care provider which exercises are safe for you.  Do not use any products that contain nicotine or tobacco, such as cigarettes and e-cigarettes. If you need help quitting, ask your health care provider.  Limit alcohol intake to no more than 1 drink a day for nonpregnant women and 2 drinks a day for men. One drink equals 12 oz of beer, 5 oz of wine, or 1 oz of hard liquor.  Try to get 7-9 hours of sleep each night. What other changes can be made?  Keep a food and activity journal to keep track of:  What you ate and how many calories you had. Remember to count sauces, dressings, and side dishes.  Whether you were active, and what exercises you did.  Your calorie, weight, and activity goals.  Check your weight regularly. Track any changes. If you notice you have gained weight, make changes to your diet or activity routine.  Avoid taking weight-loss medicines or supplements. Talk to your health care provider before starting any new medicine or supplement.  Talk to your health care provider before trying any new diet or exercise plan. Why are these changes important? Eating healthy, staying active, and having healthy habits not only help prevent obesity, they also:  Help you to manage stress  and emotions.  Help you to connect with friends and family.  Improve your self-esteem.  Improve your sleep.  Prevent long-term health problems. What can happen if changes are not made? Being obese or overweight can cause you to develop joint or bone problems, which can make it hard for you to stay active or do activities you enjoy. Being obese or overweight also puts stress on your heart and lungs and can lead to health problems like diabetes, heart disease, and some cancers. Where to find more information: Talk with your health care provider or a dietitian  about healthy eating and healthy lifestyle choices. You may also find other information through these resources:  U.S. Department of Agriculture MyPlate: https://ball-collins.biz/  American Heart Association: www.heart.org  Centers for Disease Control and Prevention: FootballExhibition.com.br Summary  Staying at a healthy weight is important. It helps prevent certain diseases and health problems, such as heart disease, diabetes, joint problems, sleep disorders, and some cancers.  Being obese or overweight can cause you to develop joint or bone problems, which can make it hard for you to stay active or do activities you enjoy.  You can prevent unhealthy weight gain by eating a healthy diet, exercising regularly, not smoking, limiting alcohol, and getting enough sleep.  Talk with your health care provider or a dietitian for guidance about healthy eating and healthy lifestyle choices. This information is not intended to replace advice given to you by your health care provider. Make sure you discuss any questions you have with your health care provider. Document Released: 01/14/2016 Document Revised: 01/14/2016 Document Reviewed: 01/14/2016 Elsevier Interactive Patient Education  2017 [employer].

## 2016-05-09 LAB — VITAMIN D 25 HYDROXY (VIT D DEFICIENCY, FRACTURES): VIT D 25 HYDROXY: 22.6 ng/mL — AB (ref 30.0–100.0)

## 2016-05-09 LAB — TSH: TSH: 0.575 u[IU]/mL (ref 0.450–4.500)

## 2016-05-15 ENCOUNTER — Telehealth: Payer: Self-pay

## 2016-05-15 NOTE — Telephone Encounter (Signed)
[employer] [place] Id# [id number] contacted patient to go over lab results pt didn't answer lvm asking pt to give me a call at her earliest convienence  

## 2016-05-25 ENCOUNTER — Encounter: Payer: Self-pay | Admitting: Family Medicine

## 2016-05-25 ENCOUNTER — Ambulatory Visit: Payer: Self-pay | Attending: Family Medicine | Admitting: Family Medicine

## 2016-05-25 VITALS — BP 119/75 | HR 65 | Temp 98.3°F | Resp 18 | Ht 62.0 in | Wt 218.6 lb

## 2016-05-25 DIAGNOSIS — Z79899 Other long term (current) drug therapy: Secondary | ICD-10-CM | POA: Insufficient documentation

## 2016-05-25 DIAGNOSIS — F419 Anxiety disorder, unspecified: Secondary | ICD-10-CM | POA: Insufficient documentation

## 2016-05-25 DIAGNOSIS — M25512 Pain in left shoulder: Secondary | ICD-10-CM | POA: Insufficient documentation

## 2016-05-25 DIAGNOSIS — M542 Cervicalgia: Secondary | ICD-10-CM | POA: Insufficient documentation

## 2016-05-25 DIAGNOSIS — G8929 Other chronic pain: Secondary | ICD-10-CM | POA: Insufficient documentation

## 2016-05-25 MED ORDER — NAPROXEN 500 MG PO TABS: 500.0000 mg | ORAL_TABLET | Freq: Two times a day (BID) | ORAL | 1 refills | Status: DC

## 2016-05-25 NOTE — Progress Notes (Signed)
Patient complains about neck being inflamed & her shoulders feel heavy   Patient is not taking any current medication  Patient has eaten for today

## 2016-05-25 NOTE — Progress Notes (Signed)
Subjective:  Patient ID: Katherine [name], female    DOB: August 24, 1975  Age: 41 y.o. MRN: [id number]  CC: Establish Care    HPI Katherine [name] presents for   Chronic shoulder an neck pain: 3 months. Reports intermittent pain, tingling of the upper shoulder area ,and decreased hand grip . Denies any history of injury or heavy lifting.Reports episode of left arm numbness where she could not move 2 hours. Reports tylenol and naprosyn provides moderate relief of pain symptoms.   Anxiety: Denies any recent stressors. Denies any SI/HI. She is agreeable to receiving counseling services at this time. She declines mediations at this time.   Outpatient Medications Prior to Visit  Medication Sig Dispense Refill  . methocarbamol (ROBAXIN) 500 MG tablet Take 1 tablet (500 mg total) by mouth 4 (four) times daily. X10 days then prn muscle spasm (Patient not taking: Reported on 05/25/2016) 90 tablet 0  . naproxen (NAPROSYN) 500 MG tablet Take 1 tablet (500 mg total) by mouth 2 (two) times daily with a meal. X 10 days then prn pain (Patient not taking: Reported on 05/25/2016) 60 tablet 0   No facility-administered medications prior to visit.     ROS Review of Systems  Constitutional: Negative.   Respiratory: Negative.   Cardiovascular: Negative.   Musculoskeletal: Positive for arthralgias and back pain.  Skin: Negative.   Neurological:       Paresthesias to left arm.    Objective:  BP 119/75 (BP Location: Left Arm, Patient Position: Sitting, Cuff Size: Normal)   Pulse 65   Temp 98.3 F (36.8 C) (Oral)   Resp 18   Ht  (1.575 m)   Wt 218 lb 9.6 oz (99.2 kg)   SpO2 99%   BMI 39.98 kg/m   BP/Weight 05/25/2016 05/08/2016 12/06/2015  Systolic BP 119 123 111  Diastolic BP 75 74 73  Wt. (Lbs) 218.6 214.4 217  BMI 39.98 41.18 41.68     Physical Exam  Cardiovascular: Normal rate, regular rhythm, normal heart sounds and intact distal pulses.   Pulmonary/Chest: Effort normal  and breath sounds normal.  Abdominal: Soft. Bowel sounds are normal.  Musculoskeletal:       Left shoulder: She exhibits decreased range of motion (w/ internal rotation) and pain.       Cervical back: She exhibits pain (with rotation ).  Neurological: She has normal reflexes.  Skin: Skin is warm and dry.  Nursing note and vitals reviewed.   Assessment & Plan:   Problem List Items Addressed This Visit    None    Visit Diagnoses    Chronic left shoulder pain    -  Primary   Relevant Medications   naproxen (NAPROSYN) 500 MG tablet   Other Relevant Orders   DG Shoulder Left   Ambulatory referral to Orthopedics   Chronic neck pain       Relevant Medications   naproxen (NAPROSYN) 500 MG tablet   Other Relevant Orders   DG Cervical Spine Complete   Ambulatory referral to Orthopedics   Anxiety       Relevant Orders   Ambulatory referral to Psychology      Meds ordered this encounter  Medications  . naproxen (NAPROSYN) 500 MG tablet    Sig: Take 1 tablet (500 mg total) by mouth 2 (two) times daily with a meal.    Dispense:  30 tablet    Refill:  1    Order Specific Question:   Supervising Provider  AnswerQuentin Angst [[id number]]    Follow-up: Return in about 8 weeks (around 07/20/2016), or if symptoms worsen or fail to improve, for Anxiety.   Lizbeth Bark FNP

## 2016-05-25 NOTE — Patient Instructions (Addendum)
Apply for orange card to complete referral process. Take over the counter Vitamin D supplement with 400 IU. Recommend recheck in 4 months.   Dolor en el hombro (Shoulder Pain) Muchas cosas pueden provocar dolor en el hombro, por ejemplo:  Una lesin en la zona.  El uso excesivo del hombro.  Artritis. La causa del dolor puede ser lo siguiente:  Inflamacin.  Una lesin en la articulacin del hombro.  Una lesin en un tendn, ligamento o hueso. INSTRUCCIONES PARA EL CUIDADO EN EL HOGAR Tome estas medidas para aliviar el dolor:  Apriete una pelota blanda o una almohadilla de goma tanto como sea posible. Esto ayuda e prevenir la hinchazn en el hombro. Tambin ayuda a Medical sales representative.  Tome los medicamentos de venta libre y los recetados solamente como se lo haya indicado el mdico.  Si se lo indican, aplique hielo sobre la zona:  Ponga el hielo en una bolsa plstica.  Coloque una toalla entre la piel y la bolsa de hielo.  Coloque el hielo durante , 2 a 3veces por Futures trader. Deje de aplicar hielo si no ayuda a Engineer, materials.  Si le indicaron que use un cabestrillo o un inmovilizador en el hombro:  selos como se lo hayan indicado.  Quteselos para ducharse o para baarse.  Mueva el brazo lo menos posible, pero mantenga la mano en movimiento para evitar la hinchazn. SOLICITE ATENCIN MDICA SI:  El dolor empeora.  El dolor no se alivia con los [employer].  Aparece un dolor nuevo en el brazo, la mano o los dedos. SOLICITE ATENCIN MDICA DE INMEDIATO SI:  El brazo, la mano o los dedos:  Hormiguean.  Se adormecen.  Se hinchan.  Duelen.  Se tornan de color blanco o azul. Esta informacin no tiene Theme park manager el consejo del mdico. Asegrese de hacerle al mdico cualquier pregunta que tenga. Document Released: 10/22/2004 Document Revised: 05/06/2015 Document Reviewed: 05/07/2014 Elsevier Interactive Patient Education  2017 Elsevier  Inc.  Naproxen and naproxen sodium oral immediate-release tablets Qu es este medicamento? El NAPROXENO es un medicamento antiinflamatorio no esteroideo (AINE). Se utiliza para reducir la inflamacin y Corporate treasurer. Este medicamento se puede Chemical engineer para Nurse, adult, dolor de [place] y perodos menstruales dolorosos. Este medicamento tambin sirve para tratar problemas dolorosos de las articulaciones y los msculos, tales como artritis, tendinitis, bursitis y Secondary school teacher. Este medicamento puede ser utilizado para otros usos; si tiene alguna pregunta consulte con su proveedor de atencin mdica o con su farmacutico. MARCAS COMUNES: Aflaxen, Aleve, Aleve Arthritis, All Day Relief, Anaprox, Anaprox DS, Naprosyn, [facility] Naproxen Sodium Qu le debo informar a mi profesional de la salud antes de tomar este medicamento? Necesita saber si usted presenta alguno de los siguientes problemas o situaciones: -asma -fuma -consume ms de 3 bebidas alcohlicas por da -enfermedad cardiaca o problemas circulatorios, tales como insuficiencia cardiaca o edema de pierna (retencin de lquido) -alta presin sangunea -enfermedad renal -enfermedad heptica -lceras o sangrado estomacal -una reaccin alrgica o inusual al naproxeno, a la aspirina, a otros AINE, otros medicamentos, alimentos, colorantes o conservantes -si est embarazada o buscando quedar embarazada -si est amamantando a un beb Cmo debo [employer]? Tome este medicamento por va oral con un vaso de agua. Siga las instrucciones de la etiqueta del [place]. Si este medicamento le produce Programme researcher, broadcasting/film/video, tmelo con alimentos. Trate de no acostarse por lo menos 10 minutos despus de tomarlo. Tome sus dosis a intervalos regulares. No tome su medicamento con  una frecuencia mayor a la indicada. El uso prolongado puede aumentar el riesgo de sufrir un ataque cardiaco o un derrame cerebral. Su farmacutico le dar una Gua del  medicamento especial con cada receta y relleno. Asegrese de leer esta informacin cada vez cuidadosamente. Hable con su pediatra para informarse acerca del uso de este medicamento en nios. Puede requerir atencin especial. Sobredosis: Pngase en contacto inmediatamente con un centro toxicolgico o una sala de urgencia si usted cree que haya tomado demasiado medicamento. ATENCIN: [employer] es solo para usted. No comparta este medicamento con nadie. Qu sucede si me olvido de una dosis? Si olvida una dosis, tmela lo antes posible. Si es casi la hora de la prxima dosis, tome slo esa dosis. No tome dosis adicionales o dobles. Qu puede interactuar con este medicamento? -alcohol -aspirina -cidofovir -diurticos -litio -metotrexato -otros medicamentos antiinflamatorios, tales Teacher, English as a foreign language o prednisona -pemetrexed -probenecid -warfarina Puede ser que esta lista no menciona todas las posibles interacciones. Informe a su profesional de [employer] de [employer] productos a base de hierbas, medicamentos de [place] o suplementos nutritivos que est tomando. Si usted fuma, consume bebidas alcohlicas o si utiliza drogas ilegales, indqueselo tambin a su profesional de [employer]. Algunas sustancias pueden interactuar con su medicamento. A qu debo estar atento al usar [employer]? Si el dolor no mejora, informe a su mdico o a su profesional de [employer]. Consulte con su mdico antes de tomar otros analgsicos. No se trate usted mismo. Este medicamento no previene ataques cardiacos o derrames cerebrales. De hecho, este medicamento puede aumentar la posibilidad de Marine scientist un ataque cardiaco o un derrame cerebral. La posibilidad puede aumentar con el uso prolongado de este medicamento y en pacientes con enfermedad cardiaca. Si est tomando aspirina para la prevencin de ataques cardiacos o derrames cerebrales, comunquese con su mdico o su profesional de [employer]. Evite tomar otros  medicamentos que contienen aspirina, ibuprofeno o naproxeno con [employer]. Es probable que se Special educational needs teacher secundarios, tales como molestias estomacales, nuseas o lceras. No debe de tomar [employer] con muchos medicamentos disponibles de [employer]. Este medicamento puede provocar lceras y hemorragia del estmago e intestinos en cualquier momento durante tratamiento. No fume ni ingiera alcohol. Esto irrita an ms el estmago y puede hacerlo ms susceptible a dao por el uso de [employer]. Pueden ocurrir lceras y hemorragia sin sntomas de Control and instrumentation engineer y Financial controller la [place]. Puede experimentar mareos o somnolencia. No conduzca ni utilice maquinaria ni haga nada que Scientist, research (life sciences) en estado de alerta hasta que sepa cmo le afecta este medicamento. No se siente ni se ponga de pie con rapidez, especialmente si es un paciente de edad avanzada. Esto reduce el riesgo de mareos o [employer]. Este medicamento puede hacerle sangrar con mayor facilidad. Trate de no lastimarse los dientes y las encas al cepillarlos o limpiarlos con hilo dental. Qu efectos secundarios puedo tener al [employer] este medicamento? Efectos secundarios que debe informar a su mdico o a Producer, television/film/video de la salud tan pronto como sea posible: -heces de color oscuro o con sangre, sangre en la orina o vmito con sangre -visin borrosa -dolor en el pecho -dificultad al respirar o sibilancias -nuseas o vmito -dolor de estmago severo -erupcin cutnea, enrojecimiento, ampollas o descamacin de la piel, urticarias o picazn -hablar arrastrando las palabras o debilidad en un lado del cuerpo -hinchazn de prpados, garganta o labios -aumento de peso o hinchazn que no tienen explicacin -cansancio o debilidad  inusual -color amarillento de los ojos o la piel Efectos secundarios que, por lo general, no requieren atencin mdica (debe informarlos a su mdico o a Producer, television/film/video de la salud si persisten o si  son molestos): -estreimiento -dolor de cabeza -acidez de estmago Puede ser que esta lista no menciona todos los posibles efectos secundarios. Comunquese a su mdico por asesoramiento mdico [employer]. Usted puede informar los efectos secundarios a la FDA por telfono al 1-800-FDA-1088. Dnde debo guardar mi medicina? Mantngala fuera del alcance de los nios. Gurdela a [employer], entre 15 y 30 grados C (22 y 27 grados F). Mantenga le envase bien cerrado. Deseche todo el medicamento que no haya utilizado, despus de la fecha de vencimiento. ATENCIN: Este folleto es un resumen. Puede ser que no cubra toda la posible informacin. Si usted tiene preguntas acerca de esta medicina, consulte con su mdico, su farmacutico o su profesional de Radiographer, therapeutic.  2018 Elsevier/Gold Standard (2014-03-07 00:00:00)

## 2016-05-27 MED FILL — NAPROXEN 500 MG TABLET: 500 | 15 days supply | Qty: 30 | Fill #0

## 2016-05-29 ENCOUNTER — Ambulatory Visit (HOSPITAL_COMMUNITY)
Admission: RE | Admit: 2016-05-29 | Discharge: 2016-05-29 | Disposition: A | Discharge status: Home / Self Care | Payer: Self-pay | Source: Ambulatory Visit | Attending: Family Medicine | Admitting: Family Medicine

## 2016-05-29 DIAGNOSIS — M50322 Other cervical disc degeneration at C5-C6 level: Secondary | ICD-10-CM | POA: Insufficient documentation

## 2016-05-29 DIAGNOSIS — M542 Cervicalgia: Secondary | ICD-10-CM

## 2016-05-29 DIAGNOSIS — G8929 Other chronic pain: Secondary | ICD-10-CM | POA: Insufficient documentation

## 2016-05-29 DIAGNOSIS — M25512 Pain in left shoulder: Secondary | ICD-10-CM | POA: Insufficient documentation

## 2016-06-03 ENCOUNTER — Telehealth: Payer: Self-pay

## 2016-06-03 NOTE — Telephone Encounter (Signed)
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 Follow up with ortho referral. Recommend over the counter Vitamin D supplement with calcium. Vitamin D helps to keep bones strong. Increase your calcium intake. Good sources include dairy products, seafood, nuts, and fortified calcium cereals. Increase physical activity.

## 2016-06-03 NOTE — Telephone Encounter (Signed)
CMA call regarding results  CMA spoke with patient daughter Corky MullDiana Franco  Daughter Verify DOB  Daughter was aware and understood

## 2016-06-03 NOTE — Telephone Encounter (Signed)
CMA spoke with daughter   Daughter was aware and understood

## 2016-06-03 NOTE — Telephone Encounter (Signed)
-----   Message from Lizbeth BarkMandesia R Hairston, FNP sent at 06/03/2016  1:26 PM EDT ----- Left shoulder normal. No fracture or dislocation present

## 2016-06-11 ENCOUNTER — Other Ambulatory Visit: Payer: Self-pay | Admitting: Family Medicine

## 2016-06-12 ENCOUNTER — Ambulatory Visit: Payer: Self-pay | Admitting: Family Medicine

## 2016-06-20 ENCOUNTER — Encounter (HOSPITAL_COMMUNITY): Payer: Self-pay | Admitting: Emergency Medicine

## 2016-06-20 ENCOUNTER — Emergency Department (HOSPITAL_COMMUNITY): Payer: Self-pay

## 2016-06-20 ENCOUNTER — Emergency Department (HOSPITAL_COMMUNITY)
Admission: EM | Admit: 2016-06-20 | Discharge: 2016-06-20 | Disposition: A | Discharge status: Home / Self Care | Payer: Self-pay | Attending: Emergency Medicine | Admitting: Emergency Medicine

## 2016-06-20 DIAGNOSIS — K219 Gastro-esophageal reflux disease without esophagitis: Secondary | ICD-10-CM | POA: Insufficient documentation

## 2016-06-20 DIAGNOSIS — R079 Chest pain, unspecified: Secondary | ICD-10-CM

## 2016-06-20 LAB — BASIC METABOLIC PANEL
ANION GAP: 8 (ref 5–15)
BUN: 10 mg/dL (ref 6–20)
CHLORIDE: 108 mmol/L (ref 101–111)
CO2: 21 mmol/L — ABNORMAL LOW (ref 22–32)
Calcium: 8.8 mg/dL — ABNORMAL LOW (ref 8.9–10.3)
Creatinine, Ser: 0.58 mg/dL (ref 0.44–1.00)
GFR calc Af Amer: >60 mL/min (ref >60)
GLUCOSE: 110 mg/dL — AB (ref 65–99)
POTASSIUM: 3.5 mmol/L (ref 3.5–5.1)
Sodium: 137 mmol/L (ref 135–145)

## 2016-06-20 LAB — CBC
HEMATOCRIT: 34.7 % — AB (ref 36.0–46.0)
HEMOGLOBIN: 11.6 g/dL — AB (ref 12.0–15.0)
MCH: 27.4 pg (ref 26.0–34.0)
MCHC: 33.4 g/dL (ref 30.0–36.0)
MCV: 82 fL (ref 78.0–100.0)
Platelets: 272 10*3/uL (ref 150–400)
RBC: 4.23 MIL/uL (ref 3.87–5.11)
RDW: 12.6 % (ref 11.5–15.5)
WBC: 10.5 10*3/uL (ref 4.0–10.5)

## 2016-06-20 LAB — I-STAT TROPONIN, ED: Troponin i, poc: 0 ng/mL (ref 0.00–0.08)

## 2016-06-20 MED ORDER — KETOROLAC TROMETHAMINE 30 MG/ML IJ SOLN
30.0000 mg | Freq: Once | INTRAMUSCULAR | Status: AC
  Administered 2016-06-20: 30 mg via INTRAVENOUS
  Filled 2016-06-20: qty 1

## 2016-06-20 MED ORDER — IBUPROFEN 600 MG PO TABS: 600.0000 mg | ORAL_TABLET | Freq: Four times a day (QID) | ORAL | 0 refills | Status: DC | PRN

## 2016-06-20 MED ORDER — PANTOPRAZOLE SODIUM 20 MG PO TBEC: 20.0000 mg | DELAYED_RELEASE_TABLET | Freq: Every day | ORAL | 0 refills | Status: DC

## 2016-06-20 MED ORDER — GI COCKTAIL ~~LOC~~
30.0000 mL | Freq: Once | ORAL | Status: AC
  Administered 2016-06-20: 30 mL via ORAL
  Filled 2016-06-20: qty 30

## 2016-06-20 NOTE — ED Notes (Signed)
Patient ambulated to the restroom with family,.

## 2016-06-20 NOTE — ED Notes (Signed)
Discharge vitals done and pt getting dressed at this time.

## 2016-06-20 NOTE — ED Notes (Signed)
Report given to Gabe RN

## 2016-06-20 NOTE — ED Provider Notes (Signed)
[facility]-EMERGENCY DEPT Provider Note   CSN: [id number] Arrival date & time: 06/20/16  1341     History   Chief Complaint Chief Complaint  Patient presents with  . Chest Pain    HPI Katherine [name] is a 41 y.o. female.  HPI  42 y.o. female with a hx of Anemia, presents to the Emergency Department today complaining of chest pain x 2 days. Pt states that it feels like a pressure sensation that is sharp in the middle of her chest. No N/V. No diaphoresis. No radiation of the pain. States that the pain is 7/10 and has been constant. No worsening with exertion or improvement with rest. Noted hx same x2 years ago and seen in ED with unremarkable work up. No fevers. No cough or URI symptoms. No meds PTA. Pt notified EMS and was given 2 nitro and ASA without change in symptoms. Denies any hx ACS. No FH. No Risk factors for ACS including HTN, HLD, DM, Smoking. No other symptoms noted.   HPI obtained through spanish interpreter.    Past Medical History:  Diagnosis Date  . Anemia   . Depression    postpartum depression  . Splenomegaly     Patient Active Problem List   Diagnosis Date Noted  . Diabetes mellitus screening 05/08/2016  . NVD (normal vaginal delivery) 07/19/2015  . Normal labor and delivery 07/19/2015  . Active labor at term 07/18/2015  . Periumbilical abdominal pain 07/31/2013  . Spleen enlarged 07/31/2013  . History of anemia 07/31/2013  . Dental caries 09/06/2012    Past Surgical History:  Procedure Laterality Date  . NO PAST SURGERIES      OB History    Gravida Para Term Preterm AB Living   6 6 6     6    SAB TAB Ectopic Multiple Live Births         0 6       Home Medications    Prior to Admission medications   Medication Sig Start Date End Date Taking? Authorizing Provider  methocarbamol (ROBAXIN) 500 MG tablet Take 1 tablet (500 mg total) by mouth 4 (four) times daily. X10 days then prn muscle spasm Patient not taking: Reported on 05/25/2016  05/08/16   Anders Simmonds, PA-C  naproxen (NAPROSYN) 500 MG tablet Take 1 tablet (500 mg total) by mouth 2 (two) times daily with a meal. 05/25/16   Lizbeth Bark, FNP    Family History Family History  Problem Relation Age of Onset  . Diabetes Father   . Kidney disease Brother     Social History Social History  Substance Use Topics  . Smoking status: Never Smoker  . Smokeless tobacco: Never Used  . Alcohol use No     Allergies   Patient has no known allergies.   Review of Systems Review of Systems ROS reviewed and all are negative for acute change except as noted in the HPI.  Physical Exam Updated Vital Signs BP 121/80   Pulse 67   Temp 98.9 F (37.2 C) (Oral)   Resp 18   LMP 06/08/2016   SpO2 100%   Physical Exam  Constitutional: She is oriented to person, place, and time. Vital signs are normal. She appears well-developed and well-nourished.  HENT:  Head: Normocephalic and atraumatic.  Right Ear: Hearing normal.  Left Ear: Hearing normal.  Eyes: Conjunctivae and EOM are normal. Pupils are equal, round, and reactive to light.  Neck: Normal range of motion. Neck supple.  Cardiovascular:  Normal rate, regular rhythm, normal heart sounds and intact distal pulses.   Pulmonary/Chest: Effort normal and breath sounds normal. She exhibits tenderness and bony tenderness. She exhibits no deformity and no swelling.    Abdominal: Soft. There is tenderness in the epigastric area and left upper quadrant. There is no rigidity, no rebound, no guarding, no CVA tenderness, no tenderness at McBurney's point and negative Murphy's sign.  Musculoskeletal: Normal range of motion.  Neurological: She is alert and oriented to person, place, and time.  Skin: Skin is warm and dry.  Psychiatric: She has a normal mood and affect. Her speech is normal and behavior is normal. Thought content normal.  Nursing note and vitals reviewed.  ED Treatments / Results  Labs (all labs ordered  are listed, but only abnormal results are displayed) Labs Reviewed  BASIC METABOLIC PANEL - Abnormal; Notable for the following:       Result Value   CO2 21 (*)    Glucose, Bld 110 (*)    Calcium 8.8 (*)    All other components within normal limits  CBC - Abnormal; Notable for the following:    Hemoglobin 11.6 (*)    HCT 34.7 (*)    All other components within normal limits  I-STAT TROPOININ, ED    EKG  EKG Interpretation  Date/Time:  Saturday Jun 20 2016 13:48:56 EDT Ventricular Rate:  72 PR Interval:    QRS Duration: 89 QT Interval:  407 QTC Calculation: 446 R Axis:   44 Text Interpretation:  Sinus rhythm Abnormal R-wave progression, early transition No significant change since last tracing Confirmed by [facility] CenterCHLOSSMAN MD, ERIN ([id number]) on 06/20/2016 3:00:50 PM       Radiology Dg Chest 2 View  Result Date: 06/20/2016 CLINICAL DATA:  41 year old female with a history of chest pain EXAM: CHEST  2 VIEW COMPARISON:  08/16/2007 FINDINGS: The heart size and mediastinal contours are within normal limits. Both lungs are clear. The visualized skeletal structures are unremarkable. IMPRESSION: No radiographic evidence of acute cardiopulmonary disease. Electronically Signed   By: Gilmer MorJaime  Wagner D.O.   On: 06/20/2016 14:25    Procedures Procedures (including critical care time)  Medications Ordered in ED Medications  gi cocktail (Maalox,Lidocaine,Donnatal) (30 mLs Oral Given 06/20/16 1525)  ketorolac (TORADOL) 30 MG/ML injection 30 mg (30 mg Intravenous Given 06/20/16 1525)     Initial Impression / Assessment and Plan / ED Course  I have reviewed the triage vital signs and the nursing notes.  Pertinent labs & imaging results that were available during my care of the patient were reviewed by me and considered in my medical decision making (see chart for details).  Final Clinical Impressions(s) / ED Diagnoses  {I have reviewed and evaluated the relevant laboratory values. {I have  reviewed and evaluated the relevant imaging studies. {I have interpreted the relevant EKG. {I have reviewed the relevant previous healthcare records. {I have reviewed EMS Documentation. {I obtained HPI from historian.   ED Course:  Assessment: Pt is a 41 y.o. female presents with CP x 2 days. Constant. No worsening with exertion or improvement with rest. No N/V. No diaphoresis or radiation of pain. No Risk Factors for ACS Given Gi Cocktail as well as Toradol in ED with complete relief of symptoms. Patient is to be discharged with recommendation to follow up with PCP in regards to today's hospital visit. Chest pain is not likely of cardiac or pulmonary etiology d/t presentation, perc negative, VSS, no tracheal deviation, no JVD or  new murmur, RRR, breath sounds equal bilaterally, EKG without acute abnormalities, negative troponin, and negative CXR. Heart Score 1. Pt has been advised start a PPI and return to the ED is CP becomes exertional, associated with diaphoresis or nausea, radiates to left jaw/arm, worsens or becomes concerning in any way. Pt appears reliable for follow up and is agreeable to discharge. Patient is in no acute distress. Vital Signs are stable. Patient is able to ambulate. Patient able to tolerate PO.   Disposition/Plan:  DC Home Additional Verbal discharge instructions given and discussed with patient.  Pt Instructed to f/u with PCP in the next week for evaluation and treatment of symptoms. Return precautions given Pt acknowledges and agrees with plan  Supervising Physician Alvira Monday, MD  Final diagnoses:  Chest pain, unspecified type  Gastric reflux    New Prescriptions New Prescriptions   No medications on file     Audry Pili, Cordelia Poche 06/20/16 1558    Alvira Monday, MD 06/21/16 1649

## 2016-06-20 NOTE — Discharge Instructions (Signed)
Please read and follow all provided instructions.  Your diagnoses today include:  1. Chest pain, unspecified type   2. Gastric reflux     Tests performed today include: An EKG of your heart A chest x-ray Cardiac enzymes - a blood test for heart muscle damage Blood counts and electrolytes Vital signs. See below for your results today.   Medications prescribed:   Take any prescribed medications only as directed.  Follow-up instructions: Please follow-up with your primary care provider as soon as you can for further evaluation of your symptoms.   Return instructions:  SEEK IMMEDIATE MEDICAL ATTENTION IF: You have severe chest pain, especially if the pain is crushing or pressure-like and spreads to the arms, back, neck, or jaw, or if you have sweating, nausea (feeling sick to your stomach), or shortness of breath. THIS IS AN EMERGENCY. Don't wait to see if the pain will go away. Get medical help at once. Call 911 or 0 (operator). DO NOT drive yourself to the hospital.  Your chest pain gets worse and does not go away with rest.  You have an attack of chest pain lasting longer than usual, despite rest and treatment with the medications your caregiver has prescribed.  You wake from sleep with chest pain or shortness of breath. You feel dizzy or faint. You have chest pain not typical of your usual pain for which you originally saw your caregiver.  You have any other emergent concerns regarding your health.  Additional Information: Chest pain comes from many different causes. Your caregiver has diagnosed you as having chest pain that is not specific for one problem, but does not require admission.  You are at low risk for an acute heart condition or other serious illness.   Your vital signs today were: BP 121/80    Pulse 67    Temp 98.9 F (37.2 C) (Oral)    Resp 18    LMP 06/08/2016    SpO2 100%  If your blood pressure (BP) was elevated above 135/85 this visit, please have this repeated  by your doctor within one month. --------------

## 2016-06-20 NOTE — ED Triage Notes (Signed)
Pt BIB by EMS from home for CP x 2 days. Pt received 2 nitro and 324 ASA in route. Pt rates pain 7/10. Resp e/u; A&Ox4; NAD. Spanish speaking, virtual interpretor at bedside.

## 2016-06-20 NOTE — ED Notes (Signed)
Patient transported to X-ray 

## 2016-10-09 ENCOUNTER — Ambulatory Visit: Payer: Self-pay

## 2016-10-19 ENCOUNTER — Ambulatory Visit: Payer: Self-pay

## 2016-10-30 ENCOUNTER — Ambulatory Visit: Payer: Self-pay | Attending: Internal Medicine

## 2016-12-23 ENCOUNTER — Ambulatory Visit: Payer: Self-pay | Admitting: Family Medicine

## 2017-03-10 ENCOUNTER — Other Ambulatory Visit: Payer: Self-pay

## 2017-03-10 ENCOUNTER — Ambulatory Visit: Payer: Self-pay | Attending: Internal Medicine | Admitting: Physician Assistant

## 2017-03-10 VITALS — BP 113/75 | HR 58 | Temp 99.0°F | Resp 16 | Wt 212.6 lb

## 2017-03-10 DIAGNOSIS — R001 Bradycardia, unspecified: Secondary | ICD-10-CM | POA: Insufficient documentation

## 2017-03-10 DIAGNOSIS — R079 Chest pain, unspecified: Secondary | ICD-10-CM | POA: Insufficient documentation

## 2017-03-10 DIAGNOSIS — M79602 Pain in left arm: Secondary | ICD-10-CM

## 2017-03-10 DIAGNOSIS — M62838 Other muscle spasm: Secondary | ICD-10-CM

## 2017-03-10 MED ORDER — METHOCARBAMOL 500 MG PO TABS: 500.0000 mg | ORAL_TABLET | Freq: Three times a day (TID) | ORAL | 0 refills | Status: DC

## 2017-03-10 MED ORDER — NAPROXEN 500 MG PO TABS: 500.0000 mg | ORAL_TABLET | Freq: Two times a day (BID) | ORAL | 1 refills | Status: DC

## 2017-03-10 MED FILL — NAPROXEN 500 MG TABLET: 500 | 15 days supply | Qty: 30 | Fill #0

## 2017-03-10 MED FILL — METHOCARBAMOL 500 MG TABLET: 500 | 30 days supply | Qty: 90 | Fill #0

## 2017-03-10 NOTE — Progress Notes (Signed)
Pt states her shoulder gets numb and doesn't know if it's the birth control or what

## 2017-03-10 NOTE — Progress Notes (Signed)
Patient ID: Katherine [name], female   DOB: December 19, 1975, 42 y.o.   MRN: [id number]     Katherine [name], is a 42 y.o. female  [id number]  [id number]  DOB - December 19, 1975  Subjective:  Chief Complaint and HPI: Katherine [name] is a 42 y.o. female here C/o pain in back, L arm, and in her chest.  2 weeks of pain.  Pain is constant.  No SOB.  No new or different activities.  She does have a toddler with her that she has to get in and out of car seats and carry around.  Pain in the back is in the upper back.  Has nexplanon in L arm for about 4 months-she has not had any problems with it.  No paresthesias.  No weakness.  No palpitations.  No cough.  Pain is worse with movement.  Hasn't taken anything for the discomfort.  Feels like soreness and is moderate in nature.    Elease HashimotoPatricia with [employer] interpreters translating.    Family history: no FH early cardiac problems  ROS:   Constitutional:  No f/c, No night sweats, No unexplained weight loss. EENT:  No vision changes, No blurry vision, No hearing changes. No mouth, throat, or ear problems.  Respiratory: No cough, No SOB Cardiac: + CP, no palpitations GI:  No abd pain, No N/V/D. GU: No Urinary s/sx Musculoskeletal: + upper back and L arm pain Neuro: No headache, no dizziness, no motor weakness.  Skin: No rash Endocrine:  No polydipsia. No polyuria.  Psych: Denies SI/HI  No problems updated.  ALLERGIES: No Known Allergies  PAST MEDICAL HISTORY: Past Medical History:  Diagnosis Date  . Anemia   . Depression    postpartum depression  . Splenomegaly     MEDICATIONS AT HOME: Prior to Admission medications   Medication Sig Start Date End Date Taking? Authorizing Provider  Cholecalciferol (VITAMIN D PO) Take 1 capsule by mouth daily.    [provider]  ibuprofen (ADVIL,MOTRIN) 600 MG tablet Take 1 tablet (600 mg total) by mouth every 6 (six) hours as needed. Patient not taking: Reported on 03/10/2017  06/20/16   Audry PiliMohr, Tyler, PA-C  medroxyPROGESTERone (DEPO-PROVERA) 150 MG/ML injection Inject 150 mg into the muscle every 3 (three) months.    [provider]  methocarbamol (ROBAXIN) 500 MG tablet Take 1 tablet (500 mg total) by mouth 3 (three) times daily. X10 days then prn muscle spasm 03/10/17   Georgian CoMcClung, Hollynn Garno M, PA-C  naproxen (NAPROSYN) 500 MG tablet Take 1 tablet (500 mg total) by mouth 2 (two) times daily with a meal. X 10 days then prn pain 03/10/17   Georgian CoMcClung, Zenith Lamphier M, PA-C  pantoprazole (PROTONIX) 20 MG tablet Take 1 tablet (20 mg total) by mouth daily. Patient not taking: Reported on 03/10/2017 06/20/16   Audry PiliMohr, Tyler, PA-C     Objective:  EXAM:   Vitals:   03/10/17 1127  BP: 113/75  Pulse: (!) 58  Resp: 16  Temp: 99 F (37.2 C)  TempSrc: Oral  SpO2: 98%  Weight: 212 lb 9.6 oz (96.4 kg)    General appearance : A&OX3. NAD. Non-toxic-appearing HEENT: Atraumatic and Normocephalic.  PERRLA. EOM intact.   Neck: supple, no JVD. No cervical lymphadenopathy. No thyromegaly Chest/Lungs:  Breathing-non-labored, Good air entry bilaterally, breath sounds normal without rales, rhonchi, or wheezing  CVS: S1 S2 regular, no murmurs, gallops, rubs  Upper back:  There is spasm with tenderness in the B upper trapezius and into the neck.  There is also  TTP of the L chest wall  Over the pectoralis major on the L.  L arm is normal on exam.  nexplanon in place without any swelling or erythema.  DTR BUE=intact. Extremities: Bilateral Lower Ext shows no edema, both legs are warm to touch with = pulse throughout Neurology:  CN II-XII grossly intact, Non focal.   Psych:  TP linear. J/I WNL. Normal speech. Appropriate eye contact and affect.  Skin:  No Rash  Data Review Lab Results  Component Value Date   HGBA1C 5.4 05/08/2016   EKG shows sinus rhythm with bradycardia at 55bpm.  No ST changes  Assessment & Plan   1. Chest pain, unspecified type Cardiac unlikely.  Likely  musculoskeletal related to carrying/lifting growing toddler - Comprehensive metabolic panel - Vitamin D, 25-hydroxy - naproxen (NAPROSYN) 500 MG tablet; Take 1 tablet (500 mg total) by mouth 2 (two) times daily with a meal. X 10 days then prn pain  Dispense: 30 tablet; Refill: 1  2. Left arm pain Likely musculoskeletal related to carrying/lifting growing toddler - Vitamin D, 25-hydroxy - naproxen (NAPROSYN) 500 MG tablet; Take 1 tablet (500 mg total) by mouth 2 (two) times daily with a meal. X 10 days then prn pain  Dispense: 30 tablet; Refill: 1 - methocarbamol (ROBAXIN) 500 MG tablet; Take 1 tablet (500 mg total) by mouth 3 (three) times daily. X10 days then prn muscle spasm  Dispense: 90 tablet; Refill: 0  3. Muscle spasm Likely musculoskeletal related to carrying/lifting growing toddler - naproxen (NAPROSYN) 500 MG tablet; Take 1 tablet (500 mg total) by mouth 2 (two) times daily with a meal. X 10 days then prn pain  Dispense: 30 tablet; Refill: 1 - methocarbamol (ROBAXIN) 500 MG tablet; Take 1 tablet (500 mg total) by mouth 3 (three) times daily. X10 days then prn muscle spasm  Dispense: 90 tablet; Refill: 0  Patient have been counseled extensively about nutrition and exercise  Return in about 2 months (around 05/08/2017) for assign new PCP.  The patient was given clear instructions to go to ER or return to medical center if symptoms don't improve, worsen or new problems develop. The patient verbalized understanding. The patient was told to call to get lab results if they haven't heard anything in the next week.     Georgian Co, PA-C [facility] [place], [place] [phone]   03/10/2017, 1:11 PM

## 2017-03-11 ENCOUNTER — Other Ambulatory Visit: Payer: Self-pay | Admitting: Physician Assistant

## 2017-03-11 LAB — VITAMIN D 25 HYDROXY (VIT D DEFICIENCY, FRACTURES): Vit D, 25-Hydroxy: 20.9 ng/mL — ABNORMAL LOW (ref 30.0–100.0)

## 2017-03-11 LAB — COMPREHENSIVE METABOLIC PANEL
A/G RATIO: 1.4 (ref 1.2–2.2)
ALBUMIN: 4.4 g/dL (ref 3.5–5.5)
ALT: 16 IU/L (ref 0–32)
AST: 14 IU/L (ref 0–40)
Alkaline Phosphatase: 103 IU/L (ref 39–117)
BILIRUBIN TOTAL: 0.3 mg/dL (ref 0.0–1.2)
BUN / CREAT RATIO: 16 (ref 9–23)
BUN: 10 mg/dL (ref 6–24)
CHLORIDE: 103 mmol/L (ref 96–106)
CO2: 21 mmol/L (ref 20–29)
Calcium: 9.2 mg/dL (ref 8.7–10.2)
Creatinine, Ser: 0.63 mg/dL (ref 0.57–1.00)
GFR calc non Af Amer: 113 mL/min/{1.73_m2} (ref >59)
GFR, EST AFRICAN AMERICAN: 130 mL/min/{1.73_m2} (ref >59)
GLOBULIN, TOTAL: 3.2 g/dL (ref 1.5–4.5)
Glucose: 89 mg/dL (ref 65–99)
POTASSIUM: 4.3 mmol/L (ref 3.5–5.2)
SODIUM: 139 mmol/L (ref 134–144)
Total Protein: 7.6 g/dL (ref 6.0–8.5)

## 2017-03-11 MED ORDER — VITAMIN D (ERGOCALCIFEROL) 1.25 MG (50000 UNIT) PO CAPS: 50000.0000 [IU] | ORAL_CAPSULE | ORAL | 0 refills | Status: DC

## 2017-03-11 MED FILL — VIT D2 1.25 MG (50,000 UNIT: 1.25 MG | 28 days supply | Qty: 4 | Fill #0

## 2017-03-15 ENCOUNTER — Telehealth: Payer: Self-pay

## 2017-03-15 NOTE — Telephone Encounter (Signed)
[employer] [place] ID# [id number] contacted pt to go over lab results pt did'nt answer lvm asking pt to give me a call at her earliest convenience   If pt calls back please give results: vitamin D is low. This can contribute to muscle aches, anxiety, fatigue, and depression. I have sent a prescription to the pharmacy for them to take once a week. We will recheck this level in 3-4 months. Her other labs are all normal. Follow-up as planned. .Marland Kitchen

## 2017-10-20 ENCOUNTER — Ambulatory Visit: Payer: Self-pay | Attending: Family Medicine | Admitting: Physician Assistant

## 2017-10-20 VITALS — BP 105/65 | HR 51 | Temp 98.3°F | Resp 18 | Ht 63.0 in | Wt 214.0 lb

## 2017-10-20 DIAGNOSIS — K429 Umbilical hernia without obstruction or gangrene: Secondary | ICD-10-CM

## 2017-10-20 DIAGNOSIS — M62838 Other muscle spasm: Secondary | ICD-10-CM

## 2017-10-20 DIAGNOSIS — H6983 Other specified disorders of Eustachian tube, bilateral: Secondary | ICD-10-CM

## 2017-10-20 DIAGNOSIS — M79602 Pain in left arm: Secondary | ICD-10-CM

## 2017-10-20 DIAGNOSIS — Z789 Other specified health status: Secondary | ICD-10-CM

## 2017-10-20 DIAGNOSIS — R202 Paresthesia of skin: Secondary | ICD-10-CM

## 2017-10-20 DIAGNOSIS — Z79899 Other long term (current) drug therapy: Secondary | ICD-10-CM | POA: Insufficient documentation

## 2017-10-20 MED ORDER — METHOCARBAMOL 500 MG PO TABS: 500.0000 mg | ORAL_TABLET | Freq: Three times a day (TID) | ORAL | 0 refills | Status: DC

## 2017-10-20 MED ORDER — FLUTICASONE PROPIONATE 50 MCG/ACT NA SUSP: 2.0000 | Freq: Every day | NASAL | 6 refills | Status: DC

## 2017-10-20 MED ORDER — NAPROXEN 500 MG PO TABS: 500.0000 mg | ORAL_TABLET | Freq: Two times a day (BID) | ORAL | 1 refills | Status: DC

## 2017-10-20 MED FILL — METHOCARBAMOL 500 MG TABS: 500 | 30 days supply | Qty: 90 | Fill #0

## 2017-10-20 MED FILL — FLUTICASONE PROP 50 MCG SPR: 50 | 30 days supply | Qty: 16 | Fill #0

## 2017-10-20 MED FILL — NAPROXEN 500 MG TABLET: 500 | 30 days supply | Qty: 30 | Fill #0

## 2017-10-20 NOTE — Patient Instructions (Signed)
Hernia umbilical en los adultos (Umbilical Hernia, Adult) Una hernia es una protrusin de tejido que sobresale a travs de una abertura entre los msculos. Una hernia umbilical se produce en el abdomen, cerca del ombligo. La hernia puede contener tejidos del intestino delgado, el intestino grueso o tejido graso que recubre el intestino (epipln). Las hernias umbilicales en los adultos suelen empeorar con el tiempo y requieren tratamiento quirrgico. Hay varios tipos de hernias umbilicales. Puede tener:  Una hernia ubicada justo por debajo o por arriba del ombligo (hernia inguinal indirecta). Es el tipo de hernia umbilical ms frecuente en los adultos.  Una hernia que se forma a travs de una abertura hecha por el ombligo (hernia inguinal directa).  Una hernia que aparece y desaparece (hernia reducible). Una hernia reducible puede ser visible solo al hacer fuerza, levantar objetos pesados o toser. Este tipo de hernia se puede reintroducir en el abdomen (reducir).  Una hernia que aprisiona tejido abdominal (hernia encarcelada). Este tipo de hernia es irreducible.  Una hernia que interrumpe el flujo de sangre a los tejidos en su interior (hernia estrangulada). Si esto ocurre, los tejidos pueden empezar a morir. Este tipo de hernia requiere tratamiento de emergencia. CAUSAS Una hernia umbilical se produce cuando el tejido dentro del abdomen ejerce presin sobre una zona debilitada de los msculos abdominales. FACTORES DE RIESGO Puede correr un mayor riesgo de tener esta afeccin en los siguientes casos:  Tiene obesidad.  Tuvo varios embarazos.  Tiene una acumulacin de lquido dentro del abdomen (ascitis).  Se someti a una ciruga que debilita los msculos abdominales. SNTOMAS El principal sntoma de esta afeccin es un bulto en el ombligo o cerca de este que no causa dolor. Una hernia reducible puede ser visible solo al hacer fuerza, levantar objetos pesados o toser. Otros sntomas pueden  incluir los siguientes:  Dolor sordo.  Sensacin de opresin. Los sntomas de una hernia estrangulada pueden incluir los siguientes:  Dolor que se vuelve cada vez ms intenso.  Nuseas y vmitos.  Dolor al ejercer presin sobre la hernia.  Cambio de color de la piel que recubre la hernia que se torna roja o violcea.  Estreimiento.  Sangre en la materia fecal (heces). DIAGNSTICO Esta afeccin se puede diagnosticar en funcin de lo siguiente:  Un examen fsico. Pueden pedirle que tosa o que haga fuerza mientras est de pie. Estas acciones aumentan la presin dentro del abdomen y empujan la hernia a travs de la abertura en los msculos. El mdico puede ejercer presin sobre la hernia para tratar de reducirla.  Los sntomas y antecedentes mdicos. TRATAMIENTO La ciruga es el nico tratamiento para una hernia umbilical. En el caso de que la hernia est estrangulada, esta se realiza lo antes posible. Si tiene una hernia pequea que no est encarcelada, tal vez tenga que bajar de peso antes de la ciruga. INSTRUCCIONES PARA EL CUIDADO EN EL HOGAR  Baje de peso, si se lo indic el mdico.  No trate de reintroducir la hernia.  Observe si la hernia cambia de color o de tamao. Infrmele al mdico si se producen cambios.  Tal vez deba evitar las actividades que aumentan la presin sobre la hernia.  No levante objetos que pesen ms de 10libras (4,5kg) hasta que el mdico le diga que es seguro.  Tome los medicamentos de venta libre y los recetados solamente como se lo haya indicado el mdico.  Concurra a todas las visitas de control como se lo haya indicado el mdico. Esto es   importante. SOLICITE ATENCIN MDICA SI:  La hernia se agranda.  La hernia le causa dolor. SOLICITE ATENCIN MDICA DE INMEDIATO SI:  Tiene un dolor intenso y repentino cerca de la zona de la hernia.  Tiene dolor, as como nuseas o vmitos.  Tiene dolor y la piel que recubre la hernia cambia de  color.  Tiene fiebre. Esta informacin no tiene Theme park manager el consejo del mdico. Asegrese de hacerle al mdico cualquier pregunta que tenga. Document Released: 09/19/2015 Document Revised: 09/19/2015 Document Reviewed: 06/14/2015 Elsevier Interactive Patient Education  2018 [employer]. Disfuncin de la trompa de Eustaquio (Eustachian Tube Dysfunction) La trompa de Eustaquio conecta el odo medio con la parte posterior de la nariz. Regula la presin de Web designer odo medio al permitir que el aire circule por el odo y la [place]. Tambin ayuda a Forensic psychologist lquido del espacio del odo [place]. Cuando la trompa de Eustaquio no funciona bien, se puede producir una acumulacin de presin de aire, lquido o ambos en el odo medio. La disfuncin de la trompa de Eustaquio puede Audiological scientist a uno o a [employer]. CAUSAS Esta afeccin ocurre cuando la trompa de [place] se bloquea o no puede abrirse normalmente. Puede ser consecuencia de lo siguiente:  Infecciones en los odos.  Resfriados y otras infecciones de las vas respiratorias superiores.  Alergias.  Irritacin, por ejemplo, por el humo del cigarrillo o los cidos del estmago que vuelven hacia el esfago (reflujo gastroesofgico).  Cambios sbitos en la presin del aire, como cuando baja un avin.  Crecimientos anormales en la nariz o la garganta, como plipos nasales, tumores o tejido engrosado en la parte posterior de la garganta (adenoides). FACTORES DE RIESGO Puede ser ms probable que esta afeccin se desarrolle en las personas que fuman y las personas que tienen sobrepeso. Tambin es ms probable que la disfuncin de la trompa de Data processing manager se produzca en los nios, especialmente los nios que presentan lo siguiente:  Ciertos defectos congnitos en la boca, como fisura del paladar.  [place]mgdalas y [place]. SNTOMAS Los sntomas de esta afeccin pueden incluir lo siguiente:  Sensacin de que el odo est  tapado.  Dolor de odo.  Ruidos como chasquidos o crujidos en el odo.  Zumbidos en el odo.  Prdida auditiva.  Prdida del equilibrio. Los sntomas pueden empeorar cuando la presin que tiene a su alrededor [place], como cuando viaja a una zona de mayor altura o en avin. DIAGNSTICO Esta afeccin se puede diagnosticar en funcin de lo siguiente:  Sus sntomas.  Un examen fsico del odo, de la [place] y de Administrator.  Pruebas en las que se determine lo siguiente: ? El movimiento de la membrana del tmpano (timpanograma). ? La audicin Allie Bossier). TRATAMIENTO El tratamiento depende de la causa y de la gravedad de Astronomer. Si los sntomas son leves, es posible que pueda aliviarlos haciendo circular aire ("destapar") dentro de los odos. Si tiene sntomas de [employer], el tratamiento puede incluir lo siguiente:  Tax inspector.  Antihistamnicos.  Aerosoles nasales o gotas para los odos que contengan medicamentos para reducir la hinchazn (corticoides). En algunos casos, puede necesitar un procedimiento para drenar el lquido de la membrana del tmpano (miringotoma). En este procedimiento, se coloca un tubo pequeo en la membrana del tmpano para hacer lo siguiente:  Drenar el lquido.  Restablecer el aire en el espacio del odo medio. INSTRUCCIONES PARA EL CUIDADO EN EL HOGAR  [employer] medicamentos de venta [place]  y los recetados solamente como se lo haya indicado el mdico.  Utilice las tcnicas recomendadas por el mdico para ayudar a Conservator, museum/gallery los odos. Estas pueden incluir las siguientes: ? Airline pilot. ? Bostezos. ? Tragar vigorosamente con frecuencia. ? Cerrar la boca, taparse la nariz y soplar suavemente por la nariz como si tratara de soltar el aire.  No haga ninguna de estas cosas hasta que el mdico lo autorice: ? Viajar a grandes alturas. ? Viajar en avin. ? Trabajar en una cabina o una habitacin presurizada. ? Practicar  buceo.  Mantener secos los odos. Squese bien los odos despus de ducharse o darse un bao.  No fume.  Concurra a todas las visitas de control como se lo haya indicado el mdico. Esto es importante. SOLICITE ATENCIN MDICA SI:  Los sntomas no desaparecen despus del tratamiento.  Los sntomas regresan despus del tratamiento.  No puede destaparse los odos.  Tiene los siguientes sntomas: ? Grant Ruts. ? Dolor en el odo. ? Dolor de cabeza o en el cuello. ? Hay lquido que sale del odo.  La audicin cambia de repente.  Se siente muy mareado.  Pierde el equilibrio. Esta informacin no tiene Theme park manager el consejo del mdico. Asegrese de hacerle al mdico cualquier pregunta que tenga. Document Released: 09/11/2015 Document Revised: 09/11/2015 Document Reviewed: 01/31/2014 Elsevier Interactive Patient Education  [employer].

## 2017-10-20 NOTE — Progress Notes (Signed)
Patient ID: Katherine [name], female   DOB: [birth year], 42 y.o.   MRN: [id number]    Katherine [name], is a 42 y.o. female  [id number]  [id number]  DOB - 1976-01-16  Subjective:  Chief Complaint and HPI: Katherine [name] is a 42 y.o. female here today Pain in L arm and back, pain is worse at night.  occurring X 2 months.  NKI. R hand dominant. Also some paresthesias. Has Nexplanon X 1 year.  No new activities.  Tylenol helps a little.  No CP/SOB  Also c/o ear pain and congestion for a few weeks.  No OTC. No fever.  Also c/o "lump" in belly near belly button that bulges out and causes pain at times.    Helmut Muster with [employer] interpreters translating  ROS:   Constitutional:  No f/c, No night sweats, No unexplained weight loss. EENT:  No vision changes, No blurry vision, No hearing changes. No additional mouth, throat, or ear problems.  Respiratory: No cough, No SOB Cardiac: No CP, no palpitations GI:  + abd pain, No N/V/D. GU: No Urinary s/sx Musculoskeletal: L arm and upper back pain Neuro: No headache, no dizziness, no motor weakness.  Skin: No rash Endocrine:  No polydipsia. No polyuria.  Psych: Denies SI/HI  No problems updated.  ALLERGIES: No Known Allergies  PAST MEDICAL HISTORY: Past Medical History:  Diagnosis Date  . Anemia   . Depression    postpartum depression  . Splenomegaly     MEDICATIONS AT HOME: Prior to Admission medications   Medication Sig Start Date End Date Taking? Authorizing Provider  ibuprofen (ADVIL,MOTRIN) 600 MG tablet Take 1 tablet (600 mg total) by mouth every 6 (six) hours as needed. 06/20/16  Yes Audry Pili, PA-C  Cholecalciferol (VITAMIN D PO) Take 1 capsule by mouth daily.    [provider]  fluticasone (FLONASE) 50 MCG/ACT nasal spray Place 2 sprays into both nostrils daily. 10/20/17   Anders Simmonds, PA-C  methocarbamol (ROBAXIN) 500 MG tablet Take 1 tablet (500 mg total) by mouth 3 (three) times  daily. X10 days then prn muscle spasm 10/20/17   Georgian Co M, PA-C  naproxen (NAPROSYN) 500 MG tablet Take 1 tablet (500 mg total) by mouth 2 (two) times daily with a meal. X 10 days then prn pain 10/20/17   Georgian Co M, PA-C  pantoprazole (PROTONIX) 20 MG tablet Take 1 tablet (20 mg total) by mouth daily. Patient not taking: Reported on 03/10/2017 06/20/16   Audry Pili, PA-C     Objective:  EXAM:   Vitals:   10/20/17 0847  BP: 105/65  Pulse: (!) 51  Resp: 18  Temp: 98.3 F (36.8 C)  TempSrc: Oral  SpO2: 98%  Weight: 214 lb (97.1 kg)  Height: 5\' 3"  (1.6 m)    General appearance : A&OX3. NAD. Non-toxic-appearing HEENT: Atraumatic and Normocephalic.  PERRLA. EOM intact.  TM full B; no infection.  Turbinates pale and boggy.   Mouth-MMM, post pharynx WNL w/o erythema, No PND. Neck: supple, no JVD. No cervical lymphadenopathy. No thyromegaly Chest/Lungs:  Breathing-non-labored, Good air entry bilaterally, breath sounds normal without rales, rhonchi, or wheezing  CVS: S1 S2 regular, no murmurs, gallops, rubs  Abdomen: Bowel sounds present, Non tender and not distended with no gaurding, rigidity or rebound.  Reducible 2cm umbilical hernia w/o sign of strangulation.   Back/neck-full S&ROM.  No bony TTP UEDTR=intact B.  Full S&ROM UE.  +TTP spasm trapezius and neck muscles in general L>R Extremities: Bilateral Lower Ext  shows no edema, both legs are warm to touch with = pulse throughout Neurology:  CN II-XII grossly intact, Non focal.   Psych:  TP linear. J/I WNL. Normal speech. Appropriate eye contact and affect.  Skin:  No Rash  Data Review Lab Results  Component Value Date   HGBA1C 5.4 05/08/2016     Assessment & Plan   1. Paresthesias L arm-no red flags - DG Cervical Spine Complete; Future - TSH  2. Muscle spasm - Vitamin D, 25-hydroxy - methocarbamol (ROBAXIN) 500 MG tablet; Take 1 tablet (500 mg total) by mouth 3 (three) times daily. X10 days then prn muscle  spasm  Dispense: 90 tablet; Refill: 0 - naproxen (NAPROSYN) 500 MG tablet; Take 1 tablet (500 mg total) by mouth 2 (two) times daily with a meal. X 10 days then prn pain  Dispense: 30 tablet; Refill: 1  3. Left arm pain - DG Cervical Spine Complete; Future - Vitamin D, 25-hydroxy - methocarbamol (ROBAXIN) 500 MG tablet; Take 1 tablet (500 mg total) by mouth 3 (three) times daily. X10 days then prn muscle spasm  Dispense: 90 tablet; Refill: 0 - naproxen (NAPROSYN) 500 MG tablet; Take 1 tablet (500 mg total) by mouth 2 (two) times daily with a meal. X 10 days then prn pain  Dispense: 30 tablet; Refill: 1  4. Umbilical hernia without obstruction and without gangrene - Ambulatory referral to General Surgery  5. Dysfunction of both eustachian tubes - fluticasone (FLONASE) 50 MCG/ACT nasal spray; Place 2 sprays into both nostrils daily.  Dispense: 16 g; Refill: 6  6. Language barrier stratus interpreters used and additional time performing visit was required.     Patient have been counseled extensively about nutrition and exercise  Return in about 3 months (around 01/19/2018) for assign new pcp.  The patient was given clear instructions to go to ER or return to medical center if symptoms don't improve, worsen or new problems develop. The patient verbalized understanding. The patient was told to call to get lab results if they haven't heard anything in the next week.     Georgian Co, PA-C [facility] [place], [place] [phone]   10/20/2017, 8:59 AM

## 2017-10-21 ENCOUNTER — Other Ambulatory Visit: Payer: Self-pay | Admitting: Physician Assistant

## 2017-10-21 LAB — VITAMIN D 25 HYDROXY (VIT D DEFICIENCY, FRACTURES): VIT D 25 HYDROXY: 22 ng/mL — AB (ref 30.0–100.0)

## 2017-10-21 LAB — TSH: TSH: 1.37 u[IU]/mL (ref 0.450–4.500)

## 2017-10-21 MED ORDER — VITAMIN D (ERGOCALCIFEROL) 1.25 MG (50000 UNIT) PO CAPS: 50000.0000 [IU] | ORAL_CAPSULE | ORAL | 0 refills | Status: DC

## 2017-10-21 MED FILL — VIT D2 1.25 MG (50,000 UNIT: 1.25 MG | 28 days supply | Qty: 4 | Fill #0

## 2017-10-22 ENCOUNTER — Telehealth: Payer: Self-pay | Admitting: *Deleted

## 2017-10-22 ENCOUNTER — Ambulatory Visit: Payer: Self-pay | Attending: Family Medicine

## 2017-10-22 NOTE — Telephone Encounter (Signed)
-----   Message from Anders Simmonds, [place] sent at 10/21/2017  7:37 AM EDT ----- Please call patient and let them that their vitamin D is low.  This can contribute to muscle aches, anxiety, fatigue, and depression.  I have sent a prescription to the pharmacy for them to take once a week.  Thyroid study was normal. We will recheck vitamin D level in 3-4 months.  Thanks, Georgian Co, PA-C

## 2017-10-22 NOTE — Telephone Encounter (Signed)
Medical Assistant used [employer] to contact patient.  Interpreter Name: Trixie Deis #: [id number] Patient was not available, [employer] left patient a voicemail. Voicemail states to give a call back to [place] with [facility] at [phone]. Patient is aware of vitamin d level being low and needing to pick up the supplement from [facility] pharmacy and take once a week with a recheck being completed in 3-4 months and thyroid is normal

## 2017-11-15 ENCOUNTER — Ambulatory Visit: Payer: Self-pay | Admitting: Family Medicine

## 2017-11-24 ENCOUNTER — Telehealth: Payer: Self-pay

## 2017-11-24 NOTE — Telephone Encounter (Signed)
Called patient and had to leave a voicemail to return my call. I will try to call her back again.

## 2017-11-26 NOTE — Telephone Encounter (Signed)
Called patient again and was not able to leave her a voicemail. I will send her a letter so she could call us back.

## 2017-12-07 ENCOUNTER — Ambulatory Visit: Payer: Self-pay | Admitting: Surgery

## 2017-12-13 ENCOUNTER — Telehealth: Payer: Self-pay | Admitting: Internal Medicine

## 2017-12-13 NOTE — Telephone Encounter (Signed)
Spoke with Pt, Pt will pick the letter at the FO

## 2017-12-13 NOTE — Telephone Encounter (Signed)
Patient came in to get a copy of her cafa letter. Patient says her appointment is November 20th @ 10:00am. Please follow up with patient.

## 2017-12-14 ENCOUNTER — Ambulatory Visit: Payer: Self-pay | Admitting: Family Medicine

## 2017-12-15 ENCOUNTER — Other Ambulatory Visit: Payer: Self-pay

## 2017-12-15 ENCOUNTER — Encounter: Payer: Self-pay | Admitting: Surgery

## 2017-12-15 ENCOUNTER — Ambulatory Visit (INDEPENDENT_AMBULATORY_CARE_PROVIDER_SITE_OTHER): Payer: Self-pay | Admitting: Surgery

## 2017-12-15 VITALS — BP 111/76 | HR 68 | Temp 97.7°F | Ht 62.0 in | Wt 217.0 lb

## 2017-12-15 DIAGNOSIS — K439 Ventral hernia without obstruction or gangrene: Secondary | ICD-10-CM

## 2017-12-15 NOTE — Progress Notes (Signed)
Patient ID: Katherine [name], female   DOB: Jun 16, 1975, 42 y.o.   MRN: [id number]  HPI Katherine [name] is a 42 y.o. female's for evaluation of a ventral hernia.  She reports that she sat the hernia for several years but over the last couple months she has been having symptoms.  Reports intermittent abdominal wall pain that is sharp moderate intensity and worsening with Valsalva.  No evidence of incarceration or strangulation.  No fevers no chills.  No weight loss.  Is modified her BMI is 39 and she is been having a lot of issues with weight loss.  Did have an ultrasound in 2015 that I have reviewed showing very mild splenomegaly.  No other acute intra-abdominal abnormalities.  Latest hemoglobin is 11.6, nml PL and her creatinine albumin are normal. He is able to perform more than 4 METS of activity without any shortness of the chest pain.  She does not smoke she does not drink.  HPI  Past Medical History:  Diagnosis Date  . Anemia   . Depression    postpartum depression  . Splenomegaly     Past Surgical History:  Procedure Laterality Date  . NO PAST SURGERIES      Family History  Problem Relation Age of Onset  . Diabetes Father   . Kidney disease Brother     Social History Social History   Tobacco Use  . Smoking status: Never Smoker  . Smokeless tobacco: Never Used  Substance Use Topics  . Alcohol use: No  . Drug use: No    No Known Allergies  Current Outpatient Medications  Medication Sig Dispense Refill  . naproxen (NAPROSYN) 500 MG tablet Take 1 tablet (500 mg total) by mouth 2 (two) times daily with a meal. X 10 days then prn pain 30 tablet 1   No current facility-administered medications for this visit.      Review of Systems Full ROS  was asked and was negative except for the information on the HPI  Physical Exam Blood pressure 111/76, pulse 68, temperature 97.7 F (36.5 C), temperature source Skin, height 5\' 2"  (1.575 m), weight 217 lb (98.4 kg),  SpO2 98 %, unknown if currently breastfeeding. CONSTITUTIONAL: NAD EYES: Pupils are equal, round, and reactive to light, Sclera are non-icteric. EARS, NOSE, MOUTH AND THROAT: The oropharynx is clear. The oral mucosa is pink and moist. Hearing is intact to voice. LYMPH NODES:  Lymph nodes in the neck are normal. RESPIRATORY:  Lungs are clear. There is normal respiratory effort, with equal breath sounds bilaterally, and without pathologic use of accessory muscles. CARDIOVASCULAR: Heart is regular without murmurs, gallops, or rubs. GI: The abdomen is  soft, there is approximately a 3-1/2 cm epigastric ventral hernia that is chronically incarcerated and tender to palpation.  No peritonitis.    GU: Rectal deferred.   MUSCULOSKELETAL: Normal muscle strength and tone. No cyanosis or edema.   SKIN: Turgor is good and there are no pathologic skin lesions or ulcers. NEUROLOGIC: Motor and sensation is grossly normal. Cranial nerves are grossly intact. PSYCH:  Oriented to person, place and time. Affect is normal.  Data Reviewed  I have personally reviewed the patient's imaging, laboratory findings and medical records.    Assessment/Plan  42 year old obese female with a symptomatic ventral hernia.  Had a lengthy discussion with the patient regarding her hernia.  Ideally we will like to have her lose some more weight.  She is adamant that she wishes to have her surgery fix.  He  has had a lot of difficulty with weight loss and is not interested in any bariatric operations at this time. He understands that she is at increased risk of recurrence hernia complications related to her obesity.  On the other hand there is no other modifiable risk factors.  After extensive discussion with the patient given her obesity I do think that she will benefit from a robotic ventral hernia approach.  She is also listed in tubal ligation during the same operative time.  She does have 6 kids already. I had a discussion with Dr.  Bonney AidStaebler and he was willing to perform tubal ligation during the same operative setting. I had a lengthy discussion with the patient about the surgery.  Risk benefit and possible complications to including but not limited to bowel injury, recurrence, pain, infection, mesh complications.  Understands and wishes to proceed.   Katherine Bigiego Megahn Killings, MD FACS General Surgeon 12/15/2017, 1:24 PM

## 2017-12-15 NOTE — H&P (View-Only) (Signed)
Patient ID: Katherine [name], female   DOB: 12/13/1975, 42 y.o.   MRN: [id number]  HPI Katherine [name] is a 42 y.o. female's for evaluation of a ventral hernia.  She reports that she sat the hernia for several years but over the last couple months she has been having symptoms.  Reports intermittent abdominal wall pain that is sharp moderate intensity and worsening with Valsalva.  No evidence of incarceration or strangulation.  No fevers no chills.  No weight loss.  Is modified her BMI is 39 and she is been having a lot of issues with weight loss.  Did have an ultrasound in 2015 that I have reviewed showing very mild splenomegaly.  No other acute intra-abdominal abnormalities.  Latest hemoglobin is 11.6, nml PL and her creatinine albumin are normal. He is able to perform more than 4 METS of activity without any shortness of the chest pain.  She does not smoke she does not drink.  HPI  Past Medical History:  Diagnosis Date  . Anemia   . Depression    postpartum depression  . Splenomegaly     Past Surgical History:  Procedure Laterality Date  . NO PAST SURGERIES      Family History  Problem Relation Age of Onset  . Diabetes Father   . Kidney disease Brother     Social History Social History   Tobacco Use  . Smoking status: Never Smoker  . Smokeless tobacco: Never Used  Substance Use Topics  . Alcohol use: No  . Drug use: No    No Known Allergies  Current Outpatient Medications  Medication Sig Dispense Refill  . naproxen (NAPROSYN) 500 MG tablet Take 1 tablet (500 mg total) by mouth 2 (two) times daily with a meal. X 10 days then prn pain 30 tablet 1   No current facility-administered medications for this visit.      Review of Systems Full ROS  was asked and was negative except for the information on the HPI  Physical Exam Blood pressure 111/76, pulse 68, temperature 97.7 F (36.5 C), temperature source Skin, height 5' 2" (1.575 m), weight 217 lb (98.4 kg),  SpO2 98 %, unknown if currently breastfeeding. CONSTITUTIONAL: NAD EYES: Pupils are equal, round, and reactive to light, Sclera are non-icteric. EARS, NOSE, MOUTH AND THROAT: The oropharynx is clear. The oral mucosa is pink and moist. Hearing is intact to voice. LYMPH NODES:  Lymph nodes in the neck are normal. RESPIRATORY:  Lungs are clear. There is normal respiratory effort, with equal breath sounds bilaterally, and without pathologic use of accessory muscles. CARDIOVASCULAR: Heart is regular without murmurs, gallops, or rubs. GI: The abdomen is  soft, there is approximately a 3-1/2 cm epigastric ventral hernia that is chronically incarcerated and tender to palpation.  No peritonitis.    GU: Rectal deferred.   MUSCULOSKELETAL: Normal muscle strength and tone. No cyanosis or edema.   SKIN: Turgor is good and there are no pathologic skin lesions or ulcers. NEUROLOGIC: Motor and sensation is grossly normal. Cranial nerves are grossly intact. PSYCH:  Oriented to person, place and time. Affect is normal.  Data Reviewed  I have personally reviewed the patient's imaging, laboratory findings and medical records.    Assessment/Plan  42-year-old obese female with a symptomatic ventral hernia.  Had a lengthy discussion with the patient regarding her hernia.  Ideally we will like to have her lose some more weight.  She is adamant that she wishes to have her surgery fix.  He   has had a lot of difficulty with weight loss and is not interested in any bariatric operations at this time. He understands that she is at increased risk of recurrence hernia complications related to her obesity.  On the other hand there is no other modifiable risk factors.  After extensive discussion with the patient given her obesity I do think that she will benefit from a robotic ventral hernia approach.  She is also listed in tubal ligation during the same operative time.  She does have 6 kids already. I had a discussion with Dr.  Staebler and he was willing to perform tubal ligation during the same operative setting. I had a lengthy discussion with the patient about the surgery.  Risk benefit and possible complications to including but not limited to bowel injury, recurrence, pain, infection, mesh complications.  Understands and wishes to proceed.   Katherine Durrett, MD FACS General Surgeon 12/15/2017, 1:24 PM   

## 2017-12-22 ENCOUNTER — Telehealth: Payer: Self-pay | Admitting: Obstetrics & Gynecology

## 2017-12-22 NOTE — Telephone Encounter (Signed)
[facility] referring for surgerical consuit with [facility] per Dr. Everlene FarrierPabon request. Called and left voicemail for patient to call back to be schedule. Patient needs interpreter

## 2017-12-28 ENCOUNTER — Ambulatory Visit (INDEPENDENT_AMBULATORY_CARE_PROVIDER_SITE_OTHER): Payer: Self-pay | Admitting: Obstetrics and Gynecology

## 2017-12-28 ENCOUNTER — Telehealth: Payer: Self-pay | Admitting: Obstetrics and Gynecology

## 2017-12-28 ENCOUNTER — Encounter: Payer: Self-pay | Admitting: Obstetrics and Gynecology

## 2017-12-28 VITALS — BP 112/66 | HR 75 | Wt 219.0 lb

## 2017-12-28 DIAGNOSIS — Z308 Encounter for other contraceptive management: Secondary | ICD-10-CM

## 2017-12-28 DIAGNOSIS — Z01818 Encounter for other preprocedural examination: Secondary | ICD-10-CM

## 2017-12-28 NOTE — Telephone Encounter (Signed)
[address]Pacific Interpreter LessliePablo, [place]# [id number] contacted patient's daughter, Darien Ramusna (on [place]) and the remainder of the call was with [place]. Darien Ramusna is aware of combined surgery w/ Dr Bonney AidStaebler and Dr Everlene FarrierPabon on Tuesday, 01/04/18, and that she will be contacted by Caryl-lyn @ Dr Hurman HornPabon's office w/ Pre-admit Testing information.

## 2017-12-28 NOTE — Progress Notes (Signed)
Obstetrics & Gynecology Surgery H&P    Chief Complaint: Scheduled Surgery   History of Present Illness: Patient is a 42 y.o. [id number] presenting for scheduled robotic bilateral salpingectomy concurrently with cholecystectomy by Dr. Everlene Farrier, for the treatment or further evaluation of undesired fertility.   Review of Systems:10 point review of systems  Past Medical History:  Past Medical History:  Diagnosis Date  . Anemia   . Depression    postpartum depression  . Splenomegaly     Past Surgical History:  Past Surgical History:  Procedure Laterality Date  . nexplanon    . NO PAST SURGERIES    . UPPER GI ENDOSCOPY  2015    Family History:  Family History  Problem Relation Age of Onset  . Diabetes Father   . Kidney disease Brother     Social History:  Social History   Socioeconomic History  . Marital status: Single    Spouse name: Not on file  . Number of children: 5  . Years of education: Not on file  . Highest education level: Not on file  Occupational History  . Not on file  Social Needs  . Financial resource strain: Not on file  . Food insecurity:    Worry: Not on file    Inability: Not on file  . Transportation needs:    Medical: Not on file    Non-medical: Not on file  Tobacco Use  . Smoking status: Never Smoker  . Smokeless tobacco: Never Used  Substance and Sexual Activity  . Alcohol use: No  . Drug use: No  . Sexual activity: Yes    Birth control/protection: None  Lifestyle  . Physical activity:    Days per week: Not on file    Minutes per session: Not on file  . Stress: Not on file  Relationships  . Social connections:    Talks on phone: Not on file    Gets together: Not on file    Attends religious service: Not on file    Active member of club or organization: Not on file    Attends meetings of clubs or organizations: Not on file    Relationship status: Not on file  . Intimate partner violence:    Fear of current or ex partner: Not on  file    Emotionally abused: Not on file    Physically abused: Not on file    Forced sexual activity: Not on file  Other Topics Concern  . Not on file  Social History Narrative  . Not on file    Allergies:  No Known Allergies  Medications: Prior to Admission medications   Medication Sig Start Date End Date Taking? Authorizing Provider  etonogestrel (NEXPLANON) 68 MG IMPL implant 1 each by Subdermal route once.    [provider]    Physical Exam Vitals: Blood pressure 112/66, pulse 75, weight 219 lb (99.3 kg), unknown if currently breastfeeding. General: NAD HEENT: normocephalic, anicteric Pulmonary: No increased work of breathing Neurologic: Grossly intact Psychiatric: mood appropriate, affect full  Imaging No results found.  Assessment: 42 y.o. [id number] presenting for scheduled robotic bilateral salpingectomy for undesired fertility  Plan: 1) 42 y.o. [id number]  with undesired fertility, desires permanent sterilization.  Other reversible forms of contraception were discussed with patient; she declines all other modalities. Permanent nature of as well as associated risks of the procedure discussed with patient including but not limited to: risk of regret, permanence of method, bleeding, infection, injury to surrounding organs and  need for additional procedures.  Failure risk of 0.5-1% with increased risk of ectopic gestation if pregnancy occurs was also discussed with patient.   - nexplanon in place for current contraceptive - given arms tucked for robotic procedure will plan on removal of nexpalnon in postoperative period.  2) Routine postoperative instructions were reviewed with the patient and her family in detail today including the expected length of recovery and likely postoperative course.  The patient concurred with the proposed plan, giving informed written consent for the surgery today.  Patient instructed on the importance of being NPO after midnight prior to her  procedure.  If warranted preoperative prophylactic antibiotics and SCDs ordered on call to the OR to meet SCIP guidelines and adhere to recommendation laid forth in ACOG Practice Bulletin Number 104 May 2009  "Antibiotic Prophylaxis for Gynecologic Procedures".     Vena AustriaAndreas Mystic Labo, MD, Evern CoreFACOG [facility], [facility] 12/29/2017, 1:00 PM

## 2017-12-28 NOTE — Telephone Encounter (Signed)
-----   Message from Vena AustriaAndreas Staebler, MD sent at 12/28/2017  3:17 PM EST ----- Regarding: surgery Surgery Date: ?  LOS: same day surgery  Surgery Booking Request Patient Full Name: Katherine [name] MRN: [id number]  DOB: [birth year]  Surgeon: Vena AustriaAndreas Staebler, MD  Requested Surgery Date and Time:  Primary Diagnosis and Code: Desires sterilization Secondary Diagnosis and Code:  Surgical Procedure: robotic bilateral salpingectomy L&D Notification:N/A Admission Status: same day surgery Length of Surgery: 30min Special Case Needs: none H&P: today (date) Phone Interview or Office Pre-Admit: phone interview Interpreter: Yes Language: Spanish Medical Clearance: No Special Scheduling Instructions: Combined case with Dr. Everlene FarrierPabon general surgery

## 2017-12-29 ENCOUNTER — Telehealth: Payer: Self-pay

## 2017-12-29 NOTE — Telephone Encounter (Signed)
Called and spoke with the patient's daughter, Darien Ramusna, and reviewed surgery instructions. The patient is scheduled for [facility]ab[facility]. She will pre admit at the hospital on 12/31/17 at 11:45 am. She is aware of dates, time, and instructions.

## 2017-12-31 ENCOUNTER — Other Ambulatory Visit: Payer: Self-pay

## 2017-12-31 ENCOUNTER — Encounter
Admission: RE | Admit: 2017-12-31 | Discharge: 2017-12-31 | Disposition: A | Discharge status: Home / Self Care | Payer: Self-pay | Source: Ambulatory Visit | Attending: Surgery | Admitting: Surgery

## 2017-12-31 DIAGNOSIS — Z01812 Encounter for preprocedural laboratory examination: Secondary | ICD-10-CM | POA: Insufficient documentation

## 2017-12-31 DIAGNOSIS — K439 Ventral hernia without obstruction or gangrene: Secondary | ICD-10-CM

## 2017-12-31 HISTORY — DX: Unspecified abdominal pain: R10.9

## 2017-12-31 LAB — COMPREHENSIVE METABOLIC PANEL
ALT: 20 U/L (ref 0–44)
AST: 20 U/L (ref 15–41)
Albumin: 3.8 g/dL (ref 3.5–5.0)
Alkaline Phosphatase: 95 U/L (ref 38–126)
Anion gap: 3 — ABNORMAL LOW (ref 5–15)
BUN: 10 mg/dL (ref 6–20)
CO2: 27 mmol/L (ref 22–32)
Calcium: 8.4 mg/dL — ABNORMAL LOW (ref 8.9–10.3)
Chloride: 107 mmol/L (ref 98–111)
Creatinine, Ser: 0.66 mg/dL (ref 0.44–1.00)
GFR calc Af Amer: >60 mL/min (ref >60)
GFR calc non Af Amer: >60 mL/min (ref >60)
Glucose, Bld: 105 mg/dL — ABNORMAL HIGH (ref 70–99)
Potassium: 3.5 mmol/L (ref 3.5–5.1)
Sodium: 137 mmol/L (ref 135–145)
Total Bilirubin: 0.3 mg/dL (ref 0.3–1.2)
Total Protein: 7.3 g/dL (ref 6.5–8.1)

## 2017-12-31 LAB — CBC WITH DIFFERENTIAL/PLATELET
Abs Immature Granulocytes: 0.1 10*3/uL — ABNORMAL HIGH (ref 0.00–0.07)
Basophils Absolute: 0.1 10*3/uL (ref 0.0–0.1)
Basophils Relative: 1 %
EOS ABS: 0.3 10*3/uL (ref 0.0–0.5)
Eosinophils Relative: 3 %
HCT: 37 % (ref 36.0–46.0)
Hemoglobin: 12.1 g/dL (ref 12.0–15.0)
Immature Granulocytes: 1 %
Lymphocytes Relative: 29 %
Lymphs Abs: 2.9 10*3/uL (ref 0.7–4.0)
MCH: 27.3 pg (ref 26.0–34.0)
MCHC: 32.7 g/dL (ref 30.0–36.0)
MCV: 83.3 fL (ref 80.0–100.0)
Monocytes Absolute: 0.7 10*3/uL (ref 0.1–1.0)
Monocytes Relative: 7 %
Neutro Abs: 5.7 10*3/uL (ref 1.7–7.7)
Neutrophils Relative %: 59 %
Platelets: 309 10*3/uL (ref 150–400)
RBC: 4.44 MIL/uL (ref 3.87–5.11)
RDW: 12.7 % (ref 11.5–15.5)
WBC: 9.7 10*3/uL (ref 4.0–10.5)
nRBC: 0 % (ref 0.0–0.2)

## 2017-12-31 MED ORDER — CHLORHEXIDINE GLUCONATE CLOTH 2 % EX PADS
6.0000 | MEDICATED_PAD | Freq: Once | CUTANEOUS | Status: DC
  Filled 2017-12-31: qty 6

## 2017-12-31 NOTE — Patient Instructions (Addendum)
Your procedure is scheduled on: 01/04/18 Tues Su procedimiento est programado para: Report to 2nd floor medical mall. Presntese a: To find out your arrival time please call [phone] between 1PM - 3PM on 01/03/18 Mon Para saber su hora de llegada por favor llame al [phone] entre la 1PM - 3PM el da:   Remember: Instructions that are not followed completely may result in serious medical risk, up to and including death,  or upon the discretion of your surgeon and anesthesiologist your surgery may need to be rescheduled.  Recuerde: Las instrucciones que no se siguen completamente Armed forces logistics/support/administrative officerpueden resultar en un riesgo de salud grave, incluyendo hasta  la [place] o a discrecin de su cirujano y Scientific laboratory techniciananestesilogo, su ciruga se puede posponer.   __X_ 1.Do not eat food after midnight the night before your procedure. No    gum chewing or hard candies. You may drink clear liquids up to 2 hours     before you are scheduled to arrive for your surgery- DO not drink clear     Liquids within 2 hours of the start of your surgery.     Clear Liquids include:    water, apple juice without pulp, clear carbohydrate drink such as    Clearfast of Gartorade, Black Coffee or Tea (Do not add anything to coffee or tea).      No coma nada despus de la medianoche de la noche anterior a su    procedimiento. No coma chicles ni caramelos duros. Puede tomar    lquidos claros hasta 2 horas antes de su hora programada de llegada al     hospital para su procedimiento. No tome lquidos claros durante el     transcurso de las 2 horas de su llegada programada al hospital para su     procedimiento, ya que esto puede llevar a que su procedimiento se    retrase o tenga que volver a Magazine features editorprogramarse.  Los lquidos claros incluyen:          - Agua o jugo de [place]manzana sin pulpa          - Bebidas claras con carbohidratos como ClearFast o Gatorade          - Caf negro o t claro (sin leche, sin cremas, no agregue nada al caf ni al  t)  No tome nada que no est en esta lista.  Los pacientes con diabetes tipo 1 y tipo 2 solo deben Printmakertomar agua.  Llame a la clnica de PreCare o a la unidad de Same Day Surgery si  tiene alguna pregunta sobre estas instrucciones.              _X__ 2.Do Not Smoke or use e-cigarettes For 24 Hours Prior to Your Surgery.    Do not use any chewable tobacco products for at least 6   hours prior to surgery.    No fume ni use cigarrillos electrnicos durante las 24 horas previas    a su [place].  No use ningn producto de tabaco masticable durante   al menos 6 horas antes de la [place].     __X_ 3. No alcohol for 24 hours before or after surgery.    No tome alcohol durante las 24 horas antes ni despus de la [place].   ____4. Bring all medications with you on the day of surgery if instructed.    Lleve todos los medicamentos con usted el da de su ciruga si se le    ha indicado as.  ____ 5. Notify your doctor if there is any change in your medical condition (cold,fever, infections).    Informe a su mdico si hay algn cambio en su condicin mdica  (resfriado, fiebre, infecciones).   Do not wear jewelry, make-up, hairpins, clips or nail polish.  No use joyas, maquillajes, pinzas/ganchos para el cabello ni esmalte de uas.  Do not wear lotions, powders, or perfumes. You may wear deodorant.  No use lociones, polvos o perfumes.  Puede usar desodorante.    Do not shave 48 hours prior to surgery. Men may shave face and neck.  No se afeite 48 horas antes de la [place].  Los hombres pueden [employer] cara  y el cuello.   Do not bring valuables to the hospital.   No lleve objetos de valor al hospital.  [facility] is not responsible for any belongings or valuables.  [place] no se hace responsable de ningn tipo de pertenencias u objetos de Licensed conveyancer.               Contacts, dentures or bridgework may not be worn into surgery.  Los lentes de [place], las dentaduras postizas o puentes no se  pueden usar en la [place].   Leave your suitcase in the car. After surgery it may be brought to your room.  Deje su maleta en el auto.  Despus de la ciruga podr traerla a su habitacin.   For patients admitted to the hospital, discharge time is determined by your  treatment team.  Para los pacientes que sean ingresados al hospital, el tiempo en el cual se le  dar de alta es determinado por su equipo de [place].   Patients discharged the day of surgery will not be allowed to drive home. A los pacientes que se les da de alta el mismo da de la ciruga no se les permitir conducir a Higher education careers adviser.   Please read over the following fact sheets that you were given: Por favor lea las siguientes hojas de informacin que le dieron:      ____ Take these medicines the morning of surgery with A SIP OF WATER:          [place] medicinas la maana de la ciruga con UN SORBO DE AGUA:  1.None  2.   3.   4.       5.  6.  ____ Fleet Enema (as directed)          Enema de Fleet (segn lo indicado)    __x__ Use CHG Soap as directed          Utilice el jabn de CHG segn lo indicado  ____ Use inhalers on the day of surgery          Use los inhaladores el da de la ciruga  ____ Stop metformin 2 days prior to surgery          Deje de tomar el metformin 2 das antes de la ciruga    ____ Take 1/2 of usual insulin dose the night before surgery and none on the morning of surgery           Tome la mitad de la dosis habitual de insulina la noche antes de la [place] y no tome nada en la maana de la             ciruga  ____ Stop Coumadin/Plavix/aspirin on           Deje de tomar el Coumadin/Plavix/aspirina el da:  ____  Stop Anti-inflammatories on           Deje de tomar antiinflamatorios el da:   ____ Stop supplements until after surgery            Deje de tomar suplementos hasta despus de la ciruga  ____ Bring C-Pap to the hospital          Lleve el C-Pap al hospital

## 2018-01-04 ENCOUNTER — Ambulatory Visit
Admission: RE | Admit: 2018-01-04 | Discharge: 2018-01-04 | Disposition: A | Discharge status: Home / Self Care | Payer: Self-pay | Source: Ambulatory Visit | Attending: Surgery | Admitting: Surgery

## 2018-01-04 ENCOUNTER — Ambulatory Visit: Payer: Self-pay

## 2018-01-04 ENCOUNTER — Encounter
Admission: RE | Disposition: A | Discharge status: Home / Self Care | Payer: Self-pay | Source: Ambulatory Visit | Attending: Surgery

## 2018-01-04 ENCOUNTER — Other Ambulatory Visit: Payer: Self-pay

## 2018-01-04 DIAGNOSIS — K439 Ventral hernia without obstruction or gangrene: Secondary | ICD-10-CM

## 2018-01-04 DIAGNOSIS — K436 Other and unspecified ventral hernia with obstruction, without gangrene: Secondary | ICD-10-CM | POA: Insufficient documentation

## 2018-01-04 DIAGNOSIS — E669 Obesity, unspecified: Secondary | ICD-10-CM | POA: Insufficient documentation

## 2018-01-04 DIAGNOSIS — Z302 Encounter for sterilization: Secondary | ICD-10-CM | POA: Insufficient documentation

## 2018-01-04 DIAGNOSIS — Z6836 Body mass index (BMI) 36.0-36.9, adult: Secondary | ICD-10-CM | POA: Insufficient documentation

## 2018-01-04 HISTORY — PX: ROBOTIC ASSISTED SALPINGO OOPHERECTOMY: SHX6082

## 2018-01-04 HISTORY — PX: ROBOTIC ASSISTED LAPAROSCOPIC VENTRAL/INCISIONAL HERNIA REPAIR: SHX6607

## 2018-01-04 LAB — POCT PREGNANCY, URINE: Preg Test, Ur: NEGATIVE

## 2018-01-04 SURGERY — ROBOTIC ASSISTED LAPAROSCOPIC VENTRAL/INCISIONAL HERNIA REPAIR
Anesthesia: General

## 2018-01-04 MED ORDER — BUPIVACAINE-EPINEPHRINE 0.25% -1:200000 IJ SOLN
INTRAMUSCULAR | Status: DC | PRN
  Administered 2018-01-04: 30 mL

## 2018-01-04 MED ORDER — ROCURONIUM BROMIDE 50 MG/5ML IV SOLN
INTRAVENOUS | Status: AC
  Filled 2018-01-04: qty 1

## 2018-01-04 MED ORDER — LACTATED RINGERS IV SOLN
INTRAVENOUS | Status: DC
  Administered 2018-01-04 (×2): via INTRAVENOUS

## 2018-01-04 MED ORDER — MIDAZOLAM HCL 2 MG/2ML IJ SOLN
INTRAMUSCULAR | Status: AC
  Filled 2018-01-04: qty 2

## 2018-01-04 MED ORDER — CELECOXIB 200 MG PO CAPS
ORAL_CAPSULE | ORAL | Status: AC
  Administered 2018-01-04: 200 mg via ORAL
  Filled 2018-01-04: qty 1

## 2018-01-04 MED ORDER — ONDANSETRON HCL 4 MG/2ML IJ SOLN
INTRAMUSCULAR | Status: DC | PRN
  Administered 2018-01-04: 4 mg via INTRAVENOUS

## 2018-01-04 MED ORDER — DEXAMETHASONE SODIUM PHOSPHATE 10 MG/ML IJ SOLN
INTRAMUSCULAR | Status: AC
  Filled 2018-01-04: qty 1

## 2018-01-04 MED ORDER — PROMETHAZINE HCL 25 MG/ML IJ SOLN: 6.2500 mg | INTRAMUSCULAR | Status: DC | PRN

## 2018-01-04 MED ORDER — LIDOCAINE HCL (CARDIAC) PF 100 MG/5ML IV SOSY
PREFILLED_SYRINGE | INTRAVENOUS | Status: DC | PRN
  Administered 2018-01-04: 80 mg via INTRAVENOUS

## 2018-01-04 MED ORDER — ONDANSETRON HCL 4 MG/2ML IJ SOLN
INTRAMUSCULAR | Status: AC
  Filled 2018-01-04: qty 2

## 2018-01-04 MED ORDER — PROPOFOL 10 MG/ML IV BOLUS
INTRAVENOUS | Status: AC
  Filled 2018-01-04: qty 20

## 2018-01-04 MED ORDER — SODIUM CHLORIDE (PF) 0.9 % IJ SOLN
INTRAMUSCULAR | Status: AC
  Filled 2018-01-04: qty 10

## 2018-01-04 MED ORDER — CEFAZOLIN SODIUM-DEXTROSE 2-4 GM/100ML-% IV SOLN
2.0000 g | INTRAVENOUS | Status: AC
  Administered 2018-01-04: 2 g via INTRAVENOUS

## 2018-01-04 MED ORDER — PHENYLEPHRINE HCL 10 MG/ML IJ SOLN
INTRAMUSCULAR | Status: DC | PRN
  Administered 2018-01-04 (×4): 100 ug via INTRAVENOUS
  Administered 2018-01-04: 50 ug via INTRAVENOUS
  Administered 2018-01-04: 100 ug via INTRAVENOUS
  Administered 2018-01-04: 150 ug via INTRAVENOUS

## 2018-01-04 MED ORDER — DEXAMETHASONE SODIUM PHOSPHATE 10 MG/ML IJ SOLN
INTRAMUSCULAR | Status: DC | PRN
  Administered 2018-01-04: 8 mg via INTRAVENOUS

## 2018-01-04 MED ORDER — FAMOTIDINE 20 MG PO TABS
20.0000 mg | ORAL_TABLET | Freq: Once | ORAL | Status: AC
  Administered 2018-01-04: 20 mg via ORAL

## 2018-01-04 MED ORDER — HYDROCODONE-ACETAMINOPHEN 5-325 MG PO TABS: 1.0000 | ORAL_TABLET | Freq: Four times a day (QID) | ORAL | 0 refills | Status: DC | PRN

## 2018-01-04 MED ORDER — HYDROCODONE-ACETAMINOPHEN 5-325 MG PO TABS
1.0000 | ORAL_TABLET | Freq: Once | ORAL | Status: AC
  Administered 2018-01-04: 1 via ORAL

## 2018-01-04 MED ORDER — HYDROMORPHONE HCL 1 MG/ML IJ SOLN
0.2500 mg | INTRAMUSCULAR | Status: DC | PRN
  Administered 2018-01-04 (×4): 0.25 mg via INTRAVENOUS

## 2018-01-04 MED ORDER — BUPIVACAINE-EPINEPHRINE (PF) 0.25% -1:200000 IJ SOLN
INTRAMUSCULAR | Status: AC
  Filled 2018-01-04: qty 30

## 2018-01-04 MED ORDER — GABAPENTIN 300 MG PO CAPS
ORAL_CAPSULE | ORAL | Status: AC
  Administered 2018-01-04: 300 mg via ORAL
  Filled 2018-01-04: qty 1

## 2018-01-04 MED ORDER — FAMOTIDINE 20 MG PO TABS
ORAL_TABLET | ORAL | Status: AC
  Administered 2018-01-04: 20 mg via ORAL
  Filled 2018-01-04: qty 1

## 2018-01-04 MED ORDER — ACETAMINOPHEN 500 MG PO TABS
ORAL_TABLET | ORAL | Status: AC
  Administered 2018-01-04: 1000 mg via ORAL
  Filled 2018-01-04: qty 2

## 2018-01-04 MED ORDER — ROCURONIUM BROMIDE 100 MG/10ML IV SOLN
INTRAVENOUS | Status: DC | PRN
  Administered 2018-01-04: 50 mg via INTRAVENOUS
  Administered 2018-01-04: 20 mg via INTRAVENOUS
  Administered 2018-01-04 (×2): 10 mg via INTRAVENOUS

## 2018-01-04 MED ORDER — SEVOFLURANE IN SOLN
RESPIRATORY_TRACT | Status: AC
  Filled 2018-01-04: qty 250

## 2018-01-04 MED ORDER — HYDROMORPHONE HCL 1 MG/ML IJ SOLN
INTRAMUSCULAR | Status: AC
  Filled 2018-01-04: qty 1

## 2018-01-04 MED ORDER — ACETAMINOPHEN 500 MG PO TABS
1000.0000 mg | ORAL_TABLET | ORAL | Status: AC
  Administered 2018-01-04: 1000 mg via ORAL

## 2018-01-04 MED ORDER — PROPOFOL 10 MG/ML IV BOLUS
INTRAVENOUS | Status: DC | PRN
  Administered 2018-01-04: 200 mg via INTRAVENOUS

## 2018-01-04 MED ORDER — FENTANYL CITRATE (PF) 100 MCG/2ML IJ SOLN
INTRAMUSCULAR | Status: DC | PRN
  Administered 2018-01-04 (×2): 50 ug via INTRAVENOUS
  Administered 2018-01-04: 100 ug via INTRAVENOUS

## 2018-01-04 MED ORDER — KETAMINE HCL 10 MG/ML IJ SOLN
INTRAMUSCULAR | Status: DC | PRN
  Administered 2018-01-04: 30 mg via INTRAVENOUS
  Administered 2018-01-04: 20 mg via INTRAVENOUS

## 2018-01-04 MED ORDER — GABAPENTIN 300 MG PO CAPS
300.0000 mg | ORAL_CAPSULE | ORAL | Status: AC
  Administered 2018-01-04: 300 mg via ORAL

## 2018-01-04 MED ORDER — LIDOCAINE HCL (PF) 2 % IJ SOLN
INTRAMUSCULAR | Status: AC
  Filled 2018-01-04: qty 10

## 2018-01-04 MED ORDER — SUGAMMADEX SODIUM 500 MG/5ML IV SOLN
INTRAVENOUS | Status: DC | PRN
  Administered 2018-01-04: 200 mg via INTRAVENOUS

## 2018-01-04 MED ORDER — CELECOXIB 200 MG PO CAPS
200.0000 mg | ORAL_CAPSULE | ORAL | Status: AC
  Administered 2018-01-04: 200 mg via ORAL

## 2018-01-04 MED ORDER — CEFAZOLIN SODIUM-DEXTROSE 2-4 GM/100ML-% IV SOLN
INTRAVENOUS | Status: AC
  Filled 2018-01-04: qty 100

## 2018-01-04 MED ORDER — FENTANYL CITRATE (PF) 250 MCG/5ML IJ SOLN
INTRAMUSCULAR | Status: AC
  Filled 2018-01-04: qty 5

## 2018-01-04 MED ORDER — MIDAZOLAM HCL 2 MG/2ML IJ SOLN
INTRAMUSCULAR | Status: DC | PRN
  Administered 2018-01-04: 2 mg via INTRAVENOUS

## 2018-01-04 MED ORDER — HYDROCODONE-ACETAMINOPHEN 5-325 MG PO TABS
ORAL_TABLET | ORAL | Status: AC
  Filled 2018-01-04: qty 1

## 2018-01-04 MED ORDER — SUGAMMADEX SODIUM 200 MG/2ML IV SOLN
INTRAVENOUS | Status: AC
  Filled 2018-01-04: qty 2

## 2018-01-04 SURGICAL SUPPLY — 73 items
BAG URINE DRAINAGE (UROLOGICAL SUPPLIES) IMPLANT
BLADE SURG SZ11 CARB STEEL (BLADE) IMPLANT
CANISTER SUCT 1200ML W/VALVE (MISCELLANEOUS) ×4 IMPLANT
CANNULA SEALS 8.5MM (CANNULA) ×2
CATH FOLEY 2WAY  5CC 16FR (CATHETERS)
CATH URTH 16FR FL 2W BLN LF (CATHETERS) IMPLANT
CHLORAPREP W/TINT 26ML (MISCELLANEOUS) ×4 IMPLANT
CORD BIP STRL DISP 12FT (MISCELLANEOUS) ×8 IMPLANT
CORD MONOPOLAR M/FML 12FT (MISCELLANEOUS) ×4 IMPLANT
COVER TIP SHEARS 8 DVNC (MISCELLANEOUS) ×2 IMPLANT
COVER TIP SHEARS 8MM DA VINCI (MISCELLANEOUS) ×2
COVER WAND RF STERILE (DRAPES) ×4 IMPLANT
CRADLE LAMINECT ARM (MISCELLANEOUS) IMPLANT
DEFOGGER SCOPE WARMER CLEARIFY (MISCELLANEOUS) ×8 IMPLANT
DERMABOND ADVANCED (GAUZE/BANDAGES/DRESSINGS) ×2
DERMABOND ADVANCED .7 DNX12 (GAUZE/BANDAGES/DRESSINGS) ×2 IMPLANT
DRAPE 3 ARM ACCESS DA VINCI (DRAPES) ×4
DRAPE 3 ARM ACCESS DVNC (DRAPES) ×4 IMPLANT
DRAPE SHEET LG 3/4 BI-LAMINATE (DRAPES) ×8 IMPLANT
ELECT REM PT RETURN 9FT ADLT (ELECTROSURGICAL) ×8
ELECTRODE REM PT RTRN 9FT ADLT (ELECTROSURGICAL) ×4 IMPLANT
FILTER LAP SMOKE EVAC STRL (MISCELLANEOUS) IMPLANT
GLOVE BIO SURGEON STRL SZ7 (GLOVE) ×24 IMPLANT
GLOVE INDICATOR 7.5 STRL GRN (GLOVE) IMPLANT
GOWN STRL REUS W/ TWL LRG LVL3 (GOWN DISPOSABLE) ×8 IMPLANT
GOWN STRL REUS W/TWL LRG LVL3 (GOWN DISPOSABLE) ×8
GRASPER SUT TROCAR 14GX15 (MISCELLANEOUS) ×4 IMPLANT
IRRIGATION STRYKERFLOW (MISCELLANEOUS) IMPLANT
IRRIGATOR STRYKERFLOW (MISCELLANEOUS)
IV NS 1000ML (IV SOLUTION)
IV NS 1000ML BAXH (IV SOLUTION) IMPLANT
KIT PINK PAD W/HEAD ARE REST (MISCELLANEOUS) ×4
KIT PINK PAD W/HEAD ARM REST (MISCELLANEOUS) ×2 IMPLANT
KIT TURNOVER CYSTO (KITS) IMPLANT
LABEL OR SOLS (LABEL) ×4 IMPLANT
MESH VENT LT ST 11.4CM CRL (Mesh General) ×4 IMPLANT
NEEDLE HYPO 22GX1.5 SAFETY (NEEDLE) ×4 IMPLANT
NEEDLE VERESS 14GA 120MM (NEEDLE) IMPLANT
NS IRRIG 1000ML POUR BTL (IV SOLUTION) ×4 IMPLANT
PACK GYN LAPAROSCOPIC (MISCELLANEOUS) IMPLANT
PACK LAP CHOLECYSTECTOMY (MISCELLANEOUS) ×4 IMPLANT
PAD OB MATERNITY 4.3X12.25 (PERSONAL CARE ITEMS) ×4 IMPLANT
PAD PREP 24X41 OB/GYN DISP (PERSONAL CARE ITEMS) IMPLANT
PENCIL ELECTRO HAND CTR (MISCELLANEOUS) ×4 IMPLANT
PROGRASP ENDOWRIST DA VINCI (INSTRUMENTS) ×2
PROGRASP ENDOWRIST DVNC (INSTRUMENTS) ×2 IMPLANT
SCISSORS METZENBAUM CVD 33 (INSTRUMENTS) IMPLANT
SEAL CANN 8.5 DVNC (CANNULA) ×2 IMPLANT
SET CYSTO W/LG BORE CLAMP LF (SET/KITS/TRAYS/PACK) IMPLANT
SLEEVE ADV FIXATION 5X100MM (TROCAR) ×4 IMPLANT
SOLUTION ELECTROLUBE (MISCELLANEOUS) ×4 IMPLANT
SPONGE LAP 18X18 RF (DISPOSABLE) ×4 IMPLANT
STRAP SAFETY 5IN WIDE (MISCELLANEOUS) IMPLANT
SUT DVC VLOC 180 0 12IN GS21 (SUTURE) ×8
SUT ETHIBOND NAB CT1 #1 30IN (SUTURE) IMPLANT
SUT MNCRL 4-0 (SUTURE) ×4
SUT MNCRL 4-0 27XMFL (SUTURE) ×4
SUT VIC AB 0 CT2 27 (SUTURE) IMPLANT
SUT VIC AB 2-0 CT1 27 (SUTURE)
SUT VIC AB 2-0 CT1 TAPERPNT 27 (SUTURE) IMPLANT
SUT VICRYL 0 AB UR-6 (SUTURE) ×8 IMPLANT
SUT VLOC 90 2/L VL 12 GS22 (SUTURE) ×4 IMPLANT
SUTURE DVC VLC 180 0 12IN GS21 (SUTURE) ×4 IMPLANT
SUTURE MNCRL 4-0 27XMF (SUTURE) ×4 IMPLANT
SYR 10ML LL (SYRINGE) IMPLANT
SYR 3ML LL SCALE MARK (SYRINGE) IMPLANT
TROCAR 130MM GELPORT  DAV (MISCELLANEOUS) ×4 IMPLANT
TROCAR DISP BLADELESS 8 DVNC (TROCAR) IMPLANT
TROCAR DISP BLADELESS 8MM (TROCAR)
TROCAR ENDO BLADELESS 11MM (ENDOMECHANICALS) IMPLANT
TROCAR XCEL 12X100 BLDLESS (ENDOMECHANICALS) IMPLANT
TROCAR XCEL NON-BLD 5MMX100MML (ENDOMECHANICALS) ×4 IMPLANT
TUBING INSUF HEATED (TUBING) ×8 IMPLANT

## 2018-01-04 NOTE — Interval H&P Note (Signed)
History and Physical Interval Note:  01/04/2018 10:11 AM  Katherine [name]  has presented today for surgery, with the diagnosis of ventral hernia,desires sterility  The various methods of treatment have been discussed with the patient and family. After consideration of risks, benefits and other options for treatment, the patient has consented to  Procedure(s): ROBOTIC ASSISTED LAPAROSCOPIC VENTRAL/INCISIONAL HERNIA REPAIR (N/A) ROBOTIC ASSISTED SALPINGECTOMY (Bilateral) as a surgical intervention .  The patient's history has been reviewed, patient examined, no change in status, stable for surgery.  I have reviewed the patient's chart and labs.  Questions were answered to the patient's satisfaction.     Priyal Musquiz F Recardo Linn

## 2018-01-04 NOTE — Op Note (Signed)
Robotic Ventral Hernia Repair with Ventralight round mesh 10.4 cms  Pre-operative Diagnosis: Ventral hernia  Post-operative Diagnosis: same  Surgeon: Sterling Bigiego Pabon, MD FACS  CO-Surgeon: Dr. Bonney AidStaebler ( performed bilateral salpingectomy)   Anesthesia: Gen. with endotracheal tube   Findings: 3.5 cm ventral hernia    Estimated Blood Loss: 10cc         Drains: none         Specimens: none       Complications: none              Procedure Details  The patient was seen again in the Holding Room. The benefits, complications, treatment options, and expected outcomes were discussed with the patient. The risks of bleeding, infection, recurrence of symptoms, failure to resolve symptoms, bowel injury, mesh placement, mesh infection, any of which could require further surgery were reviewed with the patient. The likelihood of improving the patient's symptoms with return to their baseline status is good.  The patient and/or family concurred with the proposed plan, giving informed consent.  The patient was taken to Operating Room, identified as Katherine [name] and the procedure verified.  A Time Out was held and the above information confirmed.  Prior to the induction of general anesthesia, antibiotic prophylaxis was administered. VTE prophylaxis was in place. General endotracheal anesthesia was then administered and tolerated well. After the induction, the abdomen was prepped with Chloraprep and draped in the sterile fashion. The patient was positioned in the supine position .  Subcostal incision in the left side was created with a 15 blade knife and the anterior fascia was elevated and incised.  The muscle was retracted and the posterior sheath was also elevated and incised.  2 Vicryl sutures were placed on each side of the fascia and a Hassan trocar was placed.  Pneumoperitoneum was obtained with no evidence of hemodynamic compromise.  An additional 2 8 mm ports were placed on the left side of  the abdomen under direct dilatation and an accessory 5 mm port was placed subxiphoid region. Patient was placed in a Trendelenburg position. Laparoscopy revealed no evidence of bowel injury.  The robot was brought to the surgical field and docked in the standard fashion.  Instrumentation under direct visualization at all times. We both scrubbed out and Dr. Bonney AidStaebler started the operation.  Please see his operative report for full details. He was finished with his part I took over the console.  Visualized the hernia defect measuring 3.5 cm and inserted a 10.4 Demeter ventral light circular mesh.  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  We were able to reduce it with a combination of traction and electrocautery.  Once we have full reduction of the hernia sac is 0V lock suture was used to close the defect primarily.  At this time a PMI needle was used to identify the center of the defect and we were able to use the echo location to center the mesh in the standard fashion.  The mesh was secured to the abdominal wall with a continuous 2 OV lock sutures.  The mesh to lied very nicely and flat.  There was no evidence of any bowel injury or any problems with the mesh.  Robot was undocked and all the needles and instrumentation was removed under direct visualization.  The ports were removed and pneumoperitoneum was deflated.  I closed the larger fascial defect using interrupted 0 Vicryls.  The Rest of the incisions were closed in a 2 layer fashion with 3-0  Vicryl and 4-0 Monocryl. Dermabond was used to coat the skin. Marcaine quarter percent with epinephrine and lidocaine 1% was used to inject all the incision sites. Patient tolerated procedure well and there were no immediate complications. Needle and laparotomy counts were correct   Sterling Big, MD, FACS

## 2018-01-04 NOTE — Discharge Instructions (Addendum)
Open Hernia Repair, Adult, Care After These instructions give you information about caring for yourself after your procedure. Your doctor may also give you more specific instructions. If you have problems or questions, contact your doctor. Follow these instructions at home: Surgical cut (incision) care   Follow instructions from your doctor about how to take care of your surgical cut area. Make sure you: ? Wash your hands with soap and water before you change your bandage (dressing). If you cannot use soap and water, use hand sanitizer. ? Change your bandage as told by your doctor. ? Leave stitches (sutures), skin glue, or skin tape (adhesive) strips in place. They may need to stay in place for 2 weeks or longer. If tape strips get loose and curl up, you may trim the loose edges. Do not remove tape strips completely unless your doctor says it is okay.  Check your surgical cut every day for signs of infection. Check for: ? More redness, swelling, or pain. ? More fluid or blood. ? Warmth. ? Pus or a bad smell. Activity  Do not drive or use heavy machinery while taking prescription pain medicine. Do not drive until your doctor says it is okay.  Until your doctor says it is okay: ? Do not lift anything that is heavier than 10 lb (4.5 kg). ? Do not play contact sports.  Return to your normal activities as told by your doctor. Ask your doctor what activities are safe. General instructions  To prevent or treat having a hard time pooping (constipation) while you are taking prescription pain medicine, your doctor may recommend that you: ? Drink enough fluid to keep your pee (urine) clear or pale yellow. ? Take over-the-counter or prescription medicines. ? Eat foods that are high in fiber, such as fresh fruits and vegetables, whole grains, and beans. ? Limit foods that are high in fat and processed sugars, such as fried and sweet foods.  Take over-the-counter and prescription medicines only as  told by your doctor.  Do not take baths, swim, or use a hot tub until your doctor says it is okay.  Keep all follow-up visits as told by your doctor. This is important. Contact a doctor if:  You develop a rash.  You have more redness, swelling, or pain around your surgical cut.  You have more fluid or blood coming from your surgical cut.  Your surgical cut feels warm to the touch.  You have pus or a bad smell coming from your surgical cut.  You have a fever or chills.  You have blood in your poop (stool).  You have not pooped in 2-3 days.  Medicine does not help your pain. Get help right away if:  You have chest pain or you are short of breath.  You feel light-headed.  You feel weak and dizzy (feel faint).  You have very bad pain.  You throw up (vomit) and your pain is worse. This information is not intended to replace advice given to you by your health care provider. Make sure you discuss any questions you have with your health care provider. Document Released: 02/02/2014 Document Revised: 08/02/2015 Document Reviewed: 06/26/2015 Elsevier Interactive Patient Education  2018 Elsevier Inc.   Reparacin laparoscpica de una hernia ventral (Laparoscopic Ventral Hernia Repair) La reparacin laparoscpica de Neomia Dear hernia ventral es una ciruga que se realiza para reparar este tipo de hernia. La hernia ventral, tambin llamada hernia incisional, es una protuberancia de tejido del cuerpo o de los intestinos que  sobresale en la parte anterior del abdomen. Este tipo de hernia se puede producir si el tejido conectivo que cubre los msculos del abdomen tiene un punto dbil o se rompe debido a un corte quirrgico (incisin) por una ciruga anterior. Generalmente este procedimiento se realiza poco despus del diagnstico para impedir que la hernia se agrande, comience a Ambulance person, o que se Wellsite geologist. Esta ciruga generalmente demora alrededor de 2 horas, pero el tiempo  puede variar. INFORME A SU MDICO:  Cualquier alergia que tenga.  Todos los [employer] est usando, lo que incluye corticoides, vitaminas, hierbas, gotas oftlmicas, cremas y [address] de [address].  Problemas previos que usted o los [employer] de su familia hayan tenido con el uso de anestsicos.  Enfermedades de la sangre que tenga.  Cirugas previas.  Enfermedades que tenga. RIESGOS Y COMPLICACIONES  Generalmente, la reparacin laparoscpica de una hernia ventral es un procedimiento seguro. Sin embargo, Tree surgeon procedimiento quirrgico, pueden surgir problemas. Algunos posibles problemas incluyen:  Sangrado.  Dificultad para orinar o defecar despus de la ciruga.  Infeccin.  Neumona.  Cogulos sanguneos.  Dolor en la zona de la hernia.  Un bulto en la zona de la hernia que puede ser originado por una acumulacin de lquido.  Lesin en los intestinos u otras estructuras en el abdomen.  Nueva hernia despus de la [place]. En algunos casos, puede ser necesario que el mdico detenga los procedimientos laparoscpicos y realice Neomia Dear [place] y regular. Esto puede ser necesario en el caso de hernias muy difciles, cuando los rganos son difciles de ver o cuando se producen problemas de sangrado durante la [place]. ANTES DEL PROCEDIMIENTO   Es posible que tenga que [address] de [place], [place] de [place], [place] radiografa de trax o un electrocardiograma antes del da de la [place].  Consulte a su mdico si debe cambiar o suspender los medicamentos que toma habitualmente. Esto es muy importante si toma medicamentos para la diabetes o anticoagulantes.  Es posible que tenga que higienizarse el trax con un tipo especial de jabn antisptico.  No coma ni beba nada despus de la medianoche anterior al procedimiento, o segn le haya indicado su mdico.  Planifique para que alguien lo lleve de vuelta a su casa. PROCEDIMIENTO   Le colocarn  pequeos monitores en el cuerpo. Ellos controlarn su corazn, la presin arterial y el nivel de oxgeno.  Se le colocar una va de acceso intravenoso (IV) en una vena de la mano o el brazo. Los medicamentos y lquidos pasarn directamente a su organismo a travs de esta va intravenosa.  Le administrarn un medicamento que lo har dormir (anestesia general).  Higienizarn su abdomen con una solucin especial para eliminar los grmenes de la piel.  Una vez que est dormido, le realizarn varias incisiones pequeas en el abdomen.  El gran espacio dentro del abdomen se llena de aire para expandirlo. De este modo, el mdico tendr ms espacio y Architectural technologist.  Se le colocar un tubo delgado que emite luz y que tiene una diminuta cmara en el extremo (laparoscopio) a Annette Stable de una pequea incisin en el abdomen. La cmara del laparoscopio enva una imagen a una pantalla de televisin que se encuentra en el quirfano. Esto permite que el mdico tenga una buena vista de la parte interior de su abdomen.  A travs de las otras pequeas incisiones del abdomen se insertan tubos huecos. A travs de estos tubos se coloca el instrumental necesario para los  procedimientos.  El Financial risk analyst los tejidos o los intestinos que formaron la hernia en su Environmental consultant.  Para cerrar la hernia, se [place] un parche similar a una red (malla). Esto ayuda a Chief Technology Officer. Para mantener la [employer] en su lugar se utilizan puntos, ganchos o grapas.  Sobre las incisiones se Doctor, general practice medicamento y un vendaje (apsito) o pegamento para la piel. DESPUS DEL PROCEDIMIENTO   Debe permanecer en un rea de recuperacin hasta que desaparezca el efecto de la anestesia. Le controlarn frecuentemente la presin arterial y el pulso.  Es posible que Software engineer a su casa el mismo da o Landscape architect hospital durante 1o 2das despus de la [place]. El mdico decidir cundo puede regresar a su casa.  Es posible  que sienta un poco de Engineer, mining. Pueden administrarle analgsicos.  Le indicarn que haga ejercicios de respiracin, que consisten en hacer respiraciones profundas. Esto ayuda a prevenir las infecciones pulmonares despus de [place].  Es posible que deba usar medias de compresin durante su Ambulance person hospital. Estas medias ayudan a Automotive engineer la formacin de cogulos en las piernas. Esta informacin no tiene Theme park manager el consejo del mdico. Asegrese de hacerle al mdico cualquier pregunta que tenga. Document Released: 12/30/2011 Document Revised: 01/17/2013 Elsevier Interactive Patient Education  2017 Elsevier Inc.  Reparacin laparoscpica de una hernia ventral (Laparoscopic Ventral Hernia Repair) La reparacin laparoscpica de Neomia Dear hernia ventral es una ciruga que se realiza para reparar este tipo de hernia. La hernia ventral, tambin llamada hernia incisional, es una protuberancia de tejido del cuerpo o de los intestinos que sobresale en la parte anterior del abdomen. Este tipo de hernia se puede producir si el tejido conectivo que cubre los msculos del abdomen tiene un punto dbil o se rompe debido a un corte quirrgico (incisin) por una ciruga anterior. Generalmente este procedimiento se realiza poco despus del diagnstico para impedir que la hernia se agrande, comience a Ambulance person, o que se Wellsite geologist. Esta ciruga generalmente demora alrededor de 2 horas, pero el tiempo puede variar. INFORME A SU MDICO:  Cualquier alergia que tenga.  Todos los [employer] est usando, lo que incluye corticoides, vitaminas, hierbas, gotas oftlmicas, cremas y [address] de [address].  Problemas previos que usted o los [employer] de su familia hayan tenido con el uso de anestsicos.  Enfermedades de la sangre que tenga.  Cirugas previas.  Enfermedades que tenga. RIESGOS Y COMPLICACIONES  Generalmente, la reparacin laparoscpica de una hernia ventral es un  procedimiento seguro. Sin embargo, Tree surgeon procedimiento quirrgico, pueden surgir problemas. Algunos posibles problemas incluyen:  Sangrado.  Dificultad para orinar o defecar despus de la ciruga.  Infeccin.  Neumona.  Cogulos sanguneos.  Dolor en la zona de la hernia.  Un bulto en la zona de la hernia que puede ser originado por una acumulacin de lquido.  Lesin en los intestinos u otras estructuras en el abdomen.  Nueva hernia despus de la [place]. En algunos casos, puede ser necesario que el mdico detenga los procedimientos laparoscpicos y realice Neomia Dear [place] y regular. Esto puede ser necesario en el caso de hernias muy difciles, cuando los rganos son difciles de ver o cuando se producen problemas de sangrado durante la [place]. ANTES DEL PROCEDIMIENTO   Es posible que tenga que [address] de [place], [place] de [place], [place] radiografa de trax o un electrocardiograma antes del da de la [place].  Consulte a su mdico si debe  cambiar o suspender los medicamentos que toma habitualmente. Esto es muy importante si toma medicamentos para la diabetes o anticoagulantes.  Es posible que tenga que higienizarse el trax con un tipo especial de jabn antisptico.  No coma ni beba nada despus de la medianoche anterior al procedimiento, o segn le haya indicado su mdico.  Planifique para que alguien lo lleve de vuelta a su casa. PROCEDIMIENTO   Le colocarn pequeos monitores en el cuerpo. Ellos controlarn su corazn, la presin arterial y el nivel de oxgeno.  Se le colocar una va de acceso intravenoso (IV) en una vena de la mano o el brazo. Los medicamentos y lquidos pasarn directamente a su organismo a travs de esta va intravenosa.  Le administrarn un medicamento que lo har dormir (anestesia general).  Higienizarn su abdomen con una solucin especial para eliminar los grmenes de la piel.  Una vez que est dormido, le realizarn  varias incisiones pequeas en el abdomen.  El gran espacio dentro del abdomen se llena de aire para expandirlo. De este modo, el mdico tendr ms espacio y Architectural technologistuna mejor vista.  Se le colocar un tubo delgado que emite luz y que tiene una diminuta cmara en el extremo (laparoscopio) a Annette Stabletravs de una pequea incisin en el abdomen. La cmara del laparoscopio enva una imagen a una pantalla de televisin que se encuentra en el quirfano. Esto permite que el mdico tenga una buena vista de la parte interior de su abdomen.  A travs de las otras pequeas incisiones del abdomen se insertan tubos huecos. A travs de estos tubos se coloca el instrumental necesario para los procedimientos.  El Financial risk analystmdico colocar los tejidos o los intestinos que formaron la hernia en su Environmental consultantlugar.  Para cerrar la hernia, se [place] un parche similar a una red (malla). Esto ayuda a Chief Technology Officerfortalecer el rea. Para mantener la [employer] Airlinesmalla en su lugar se utilizan puntos, ganchos o grapas.  Sobre las incisiones se Doctor, general practicecolocar un medicamento y un vendaje (apsito) o pegamento para la piel. DESPUS DEL PROCEDIMIENTO   Debe permanecer en un rea de recuperacin hasta que desaparezca el efecto de la anestesia. Le controlarn frecuentemente la presin arterial y el pulso.  Es posible que Software engineerpueda volver a su casa el mismo da o Landscape architectpuede necesitar permanecer en el hospital durante 1o 2das despus de la [place]. El mdico decidir cundo puede regresar a su casa.  Es posible que sienta un poco de Engineer, miningdolor. Pueden administrarle analgsicos.  Le indicarn que haga ejercicios de respiracin, que consisten en hacer respiraciones profundas. Esto ayuda a prevenir las infecciones pulmonares despus de [place] cir[place].  Es posible que deba usar medias de compresin durante su Ambulance personestada en el hospital. Estas medias ayudan a Automotive engineerevitar la formacin de cogulos en las piernas. Esta informacin no tiene Theme park managercomo fin reemplazar el consejo del mdico. Asegrese de hacerle al mdico cualquier  pregunta que tenga. Document Released: 12/30/2011 Document Revised: 01/17/2013 Elsevier Interactive Patient Education  2017 [employer] Hernia A hernia happens when an organ or tissue inside your body pushes out through a weak spot in the belly (abdomen). Follow these instructions at home:  Avoid stretching or overusing (straining) the muscles near the hernia.  Do not lift anything heavier than 10 lb (4.5 kg).  Use the muscles in your leg when you lift something up. Do not use the muscles in your back.  When you cough, try to cough gently.  Eat a diet that has a lot of fiber. Eat lots of fruits  and vegetables.  Drink enough fluids to keep your pee (urine) clear or pale yellow. Try to drink 6-8 glasses of water a day.  Take medicines to make your poop soft (stool softeners) as told by your doctor.  Lose weight, if you are overweight.  Do not use any tobacco products, including cigarettes, chewing tobacco, or electronic cigarettes. If you need help quitting, ask your doctor.  Keep all follow-up visits as told by your doctor. This is important. Contact a doctor if:  The skin by the hernia gets puffy (swollen) or red.  The hernia is painful. Get help right away if:  You have a fever.  You have belly pain that is getting worse.  You feel sick to your stomach (nauseous) or you throw up (vomit).  You cannot push the hernia back in place by gently pressing on it while you are lying down.  The hernia: ? Changes in shape or size. ? Is stuck outside your belly. ? Changes color. ? Feels hard or tender. This information is not intended to replace advice given to you by your health care provider. Make sure you discuss any questions you have with your health care provider. Document Released: 07/02/2009 Document Revised: 06/20/2015 Document Reviewed: 11/22/2013 Elsevier Interactive Patient Education  2018 Elsevier Inc. AMBULATORY SURGERY  DISCHARGE INSTRUCTIONS   1) The drugs  that you were given will stay in your system until tomorrow so for the next 24 hours you should not:  A) Drive an automobile B) Make any legal decisions C) Drink any alcoholic beverage   2) You may resume regular meals tomorrow.  Today it is better to start with liquids and gradually work up to solid foods.  You may eat anything you prefer, but it is better to start with liquids, then soup and crackers, and gradually work up to solid foods.   3) Please notify your doctor immediately if you have any unusual bleeding, trouble breathing, redness and pain at the surgery site, drainage, fever, or pain not relieved by medication.    4) Additional Instructions:        Please contact your physician with any problems or Same Day Surgery at [phone], Monday through Friday 6 am to 4 pm, or [place] [facility] number at [phone].  Reparacin laparoscpica de una hernia ventral (Laparoscopic Ventral Hernia Repair) La reparacin laparoscpica de una hernia ventral es una ciruga que se realiza para reparar este tipo de hernia. La hernia ventral, tambin llamada hernia incisional, es una protuberancia de tejido del cuerpo o de los intestinos que sobresale en la parte anterior del abdomen. Este tipo de hernia se puede producir si el tejido conectivo que cubre los msculos del abdomen tiene un punto dbil o se rompe debido a un corte quirrgico (incisin) por una ciruga anterior. Generalmente este procedimiento se realiza poco despus del diagnstico para impedir que la hernia se agrande, comience a Ambulance person, o que se Wellsite geologist. Esta ciruga generalmente demora alrededor de 2 horas, pero el tiempo puede variar. INFORME A SU MDICO:  Cualquier alergia que tenga.  Todos los [employer] est usando, lo que incluye corticoides, vitaminas, hierbas, gotas oftlmicas, cremas y [address] de [address].  Problemas previos que usted o los [employer] de su familia  hayan tenido con el uso de anestsicos.  Enfermedades de la sangre que tenga.  Cirugas previas.  Enfermedades que tenga. RIESGOS Y COMPLICACIONES  Generalmente, la reparacin laparoscpica de una hernia ventral es un procedimiento seguro.  Sin embargo, Tree surgeon procedimiento quirrgico, pueden surgir problemas. Algunos posibles problemas incluyen:  Sangrado.  Dificultad para orinar o defecar despus de la ciruga.  Infeccin.  Neumona.  Cogulos sanguneos.  Dolor en la zona de la hernia.  Un bulto en la zona de la hernia que puede ser originado por una acumulacin de lquido.  Lesin en los intestinos u otras estructuras en el abdomen.  Nueva hernia despus de la [place]. En algunos casos, puede ser necesario que el mdico detenga los procedimientos laparoscpicos y realice Neomia Dear [place] y regular. Esto puede ser necesario en el caso de hernias muy difciles, cuando los rganos son difciles de ver o cuando se producen problemas de sangrado durante la [place]. ANTES DEL PROCEDIMIENTO   Es posible que tenga que [address] de [place], [place] de [place], [place] radiografa de trax o un electrocardiograma antes del da de la [place].  Consulte a su mdico si debe cambiar o suspender los medicamentos que toma habitualmente. Esto es muy importante si toma medicamentos para la diabetes o anticoagulantes.  Es posible que tenga que higienizarse el trax con un tipo especial de jabn antisptico.  No coma ni beba nada despus de la medianoche anterior al procedimiento, o segn le haya indicado su mdico.  Planifique para que alguien lo lleve de vuelta a su casa. PROCEDIMIENTO   Le colocarn pequeos monitores en el cuerpo. Ellos controlarn su corazn, la presin arterial y el nivel de oxgeno.  Se le colocar una va de acceso intravenoso (IV) en una vena de la mano o el brazo. Los medicamentos y lquidos pasarn directamente a su organismo a travs de esta va  intravenosa.  Le administrarn un medicamento que lo har dormir (anestesia general).  Higienizarn su abdomen con una solucin especial para eliminar los grmenes de la piel.  Una vez que est dormido, le realizarn varias incisiones pequeas en el abdomen.  El gran espacio dentro del abdomen se llena de aire para expandirlo. De este modo, el mdico tendr ms espacio y Architectural technologist.  Se le colocar un tubo delgado que emite luz y que tiene una diminuta cmara en el extremo (laparoscopio) a Annette Stable de una pequea incisin en el abdomen. La cmara del laparoscopio enva una imagen a una pantalla de televisin que se encuentra en el quirfano. Esto permite que el mdico tenga una buena vista de la parte interior de su abdomen.  A travs de las otras pequeas incisiones del abdomen se insertan tubos huecos. A travs de estos tubos se coloca el instrumental necesario para los procedimientos.  El Financial risk analyst los tejidos o los intestinos que formaron la hernia en su Environmental consultant.  Para cerrar la hernia, se [place] un parche similar a una red (malla). Esto ayuda a Chief Technology Officer. Para mantener la [employer] en su lugar se utilizan puntos, ganchos o grapas.  Sobre las incisiones se Doctor, general practice medicamento y un vendaje (apsito) o pegamento para la piel. DESPUS DEL PROCEDIMIENTO   Debe permanecer en un rea de recuperacin hasta que desaparezca el efecto de la anestesia. Le controlarn frecuentemente la presin arterial y el pulso.  Es posible que Software engineer a su casa el mismo da o Landscape architect hospital durante 1o 2das despus de la [place]. El mdico decidir cundo puede regresar a su casa.  Es posible que sienta un poco de Engineer, mining. Pueden administrarle analgsicos.  Le indicarn que haga ejercicios de respiracin, que consisten en hacer respiraciones profundas. Esto ayuda  a prevenir las infecciones pulmonares despus de [place].  Es posible que deba usar medias de  compresin durante su Ambulance person hospital. Estas medias ayudan a Automotive engineer la formacin de cogulos en las piernas. Esta informacin no tiene Theme park manager el consejo del mdico. Asegrese de hacerle al mdico cualquier pregunta que tenga. Document Released: 12/30/2011 Document Revised: 01/17/2013 Elsevier Interactive Patient Education  2017 [employer].

## 2018-01-04 NOTE — Anesthesia Post-op Follow-up Note (Signed)
Anesthesia QCDR form completed.        

## 2018-01-04 NOTE — Anesthesia Preprocedure Evaluation (Addendum)
Anesthesia Evaluation  Patient identified by MRN, date of birth, ID band Patient awake    Reviewed: Allergy & Precautions, H&P , NPO status , Patient's Chart, lab work & pertinent test results  Airway Mallampati: III       Dental  (+) Teeth Intact   Pulmonary neg pulmonary ROS,           Cardiovascular negative cardio ROS       Neuro/Psych PSYCHIATRIC DISORDERS Depression negative neurological ROS     GI/Hepatic negative GI ROS, Neg liver ROS,   Endo/Other  obese  Renal/GU      Musculoskeletal   Abdominal   Peds  Hematology  (+) Blood dyscrasia, anemia ,   Anesthesia Other Findings Past Medical History: No date: Abdominal pain No date: Anemia No date: Depression     Comment:  postpartum depression No date: Splenomegaly  Past Surgical History: No date: nexplanon No date: NO PAST SURGERIES 2015: UPPER GI ENDOSCOPY     Reproductive/Obstetrics negative OB ROS                            Anesthesia Physical Anesthesia Plan  ASA: II  Anesthesia Plan: General ETT   Post-op Pain Management:    Induction:   PONV Risk Score and Plan: Ondansetron, Dexamethasone, Midazolam and Treatment may vary due to age or medical condition  Airway Management Planned:   Additional Equipment:   Intra-op Plan:   Post-operative Plan:   Informed Consent: I have reviewed the patients History and Physical, chart, labs and discussed the procedure including the risks, benefits and alternatives for the proposed anesthesia with the patient or authorized representative who has indicated his/her understanding and acceptance.   Dental Advisory Given  Plan Discussed with: Anesthesiologist, CRNA and Surgeon  Anesthesia Plan Comments:         Anesthesia Quick Evaluation

## 2018-01-04 NOTE — Transfer of Care (Signed)
Immediate Anesthesia Transfer of Care Note  Patient: Katherine [name]  Procedure(s) Performed: ROBOTIC ASSISTED LAPAROSCOPIC VENTRAL/INCISIONAL HERNIA REPAIR (N/A ) ROBOTIC ASSISTED SALPINGECTOMY (Bilateral )  Patient Location: PACU  Anesthesia Type:General  Level of Consciousness: drowsy  Airway & Oxygen Therapy: Patient Spontanous Breathing and Patient connected to face mask oxygen  Post-op Assessment: Report given to RN  Post vital signs: stable  Last Vitals:  Vitals Value Taken Time  BP 118/70 01/04/2018  1:15 PM  Temp 36.5 C 01/04/2018  1:15 PM  Pulse 87 01/04/2018  1:17 PM  Resp 19 01/04/2018  1:17 PM  SpO2 100 % 01/04/2018  1:17 PM  Vitals shown include unvalidated device data.  Last Pain:  Vitals:   01/04/18 1315  TempSrc:   PainSc: Asleep         Complications: No apparent anesthesia complications

## 2018-01-04 NOTE — Op Note (Signed)
Preoperative Diagnosis: 1) 42 y.o. with undesired fertility  Postoperative Diagnosis: 1) 42 y.o. with undesired fertility  Operation Performed: Robotic bilateral salpingectomy  Indication: 42 y.o. [id number]  with undesired fertility, desires permanent sterilization.  Other reversible forms of contraception were discussed with patient; she declines all other modalities. Permanent nature of as well as associated risks of the procedure discussed with patient including but not limited to: risk of regret, permanence of method, bleeding, infection, injury to surrounding organs and need for additional procedures.  Failure risk of 0.5-1% with increased risk of ectopic gestation if pregnancy occurs was also discussed with patient.    Surgeon: Vena AustriaAndreas Zehra Rucci, MD  Anesthesia: General  Preoperative Antibiotics: none  Estimated Blood Loss: minimal   Drains or Tubes: none  Implants: none  Specimens Removed: bilateral fallopian tubes  Complications: none  Intraoperative Findings: Normal tubes, ovaries, and uterus.    Patient Condition: stable  Procedure in Detail:  Patient was taken to the operating room where she was administered general anesthesia.  She was positioned in the supine position.  After Dr. Everlene FarrierPabon had gained peritoneal entry and the DaVinci was docked.  Attention was turned to the patient's pelvis.  A fenestrated bipolar was placed in the left arm and a monopolar scissors in the right.  The right tube was identified, grasped at its fimbriated end.  The attachments of the tube to the ovary, mesosalpinx, and uterus were cauterized using the fenestrated bipolar then transected using the monopolar scissors in serial bites.  The excision line was noted to be hemostatic.  The left tube was identified and removed in a similar fashion.  Both tubes were then removed through 5mm Excel trocar.  At this point Dr. Everlene FarrierPabon started his ventral hernia repair. Please see his op note for further  details.

## 2018-01-04 NOTE — H&P (Signed)
Date of Initial H&P: 12/28/2017  History reviewed, patient examined, no change in status, stable for surgery.  

## 2018-01-04 NOTE — Anesthesia Procedure Notes (Addendum)
Procedure Name: Intubation Date/Time: 01/04/2018 10:54 AM Performed by: Durenda Hurt, MD Pre-anesthesia Checklist: Patient identified, Emergency Drugs available, Suction available, Patient being monitored and Timeout performed Patient Re-evaluated:Patient Re-evaluated prior to induction Oxygen Delivery Method: Circle system utilized Preoxygenation: Pre-oxygenation with 100% oxygen Induction Type: IV induction Ventilation: Mask ventilation without difficulty Laryngoscope Size: Mac and 3 Grade View: Grade I Tube type: Oral Tube size: 7.0 mm Number of attempts: 1 Airway Equipment and Method: Stylet Placement Confirmation: ETT inserted through vocal cords under direct vision,  positive ETCO2 and breath sounds checked- equal and bilateral Secured at: 21 cm Tube secured with: Tape Dental Injury: Teeth and Oropharynx as per pre-operative assessment

## 2018-01-05 ENCOUNTER — Encounter: Payer: Self-pay | Admitting: Surgery

## 2018-01-05 LAB — SURGICAL PATHOLOGY

## 2018-01-06 ENCOUNTER — Emergency Department (HOSPITAL_COMMUNITY): Payer: Self-pay

## 2018-01-06 ENCOUNTER — Emergency Department (HOSPITAL_COMMUNITY)
Admission: EM | Admit: 2018-01-06 | Discharge: 2018-01-06 | Disposition: A | Discharge status: Home / Self Care | Payer: Self-pay | Attending: Emergency Medicine | Admitting: Emergency Medicine

## 2018-01-06 ENCOUNTER — Encounter (HOSPITAL_COMMUNITY): Payer: Self-pay

## 2018-01-06 ENCOUNTER — Other Ambulatory Visit: Payer: Self-pay

## 2018-01-06 DIAGNOSIS — K567 Ileus, unspecified: Secondary | ICD-10-CM | POA: Insufficient documentation

## 2018-01-06 LAB — CBC WITH DIFFERENTIAL/PLATELET
Abs Immature Granulocytes: 0.16 10*3/uL — ABNORMAL HIGH (ref 0.00–0.07)
Basophils Absolute: 0.1 10*3/uL (ref 0.0–0.1)
Basophils Relative: 0 %
Eosinophils Absolute: 0.1 10*3/uL (ref 0.0–0.5)
Eosinophils Relative: 0 %
HEMATOCRIT: 36.1 % (ref 36.0–46.0)
Hemoglobin: 11.8 g/dL — ABNORMAL LOW (ref 12.0–15.0)
Immature Granulocytes: 1 %
LYMPHS PCT: 11 %
Lymphs Abs: 2 10*3/uL (ref 0.7–4.0)
MCH: 27.6 pg (ref 26.0–34.0)
MCHC: 32.7 g/dL (ref 30.0–36.0)
MCV: 84.5 fL (ref 80.0–100.0)
Monocytes Absolute: 1.1 10*3/uL — ABNORMAL HIGH (ref 0.1–1.0)
Monocytes Relative: 6 %
Neutro Abs: 14.8 10*3/uL — ABNORMAL HIGH (ref 1.7–7.7)
Neutrophils Relative %: 82 %
Platelets: 288 10*3/uL (ref 150–400)
RBC: 4.27 MIL/uL (ref 3.87–5.11)
RDW: 12.6 % (ref 11.5–15.5)
WBC: 18.2 10*3/uL — ABNORMAL HIGH (ref 4.0–10.5)
nRBC: 0 % (ref 0.0–0.2)

## 2018-01-06 LAB — URINALYSIS, ROUTINE W REFLEX MICROSCOPIC
Bilirubin Urine: NEGATIVE
Glucose, UA: NEGATIVE mg/dL
Ketones, ur: 20 mg/dL — AB
Leukocytes, UA: NEGATIVE
Nitrite: NEGATIVE
Protein, ur: NEGATIVE mg/dL
Specific Gravity, Urine: 1.009 (ref 1.005–1.030)
pH: 6 (ref 5.0–8.0)

## 2018-01-06 LAB — COMPREHENSIVE METABOLIC PANEL
ALT: 16 U/L (ref 0–44)
ANION GAP: 9 (ref 5–15)
AST: 17 U/L (ref 15–41)
Albumin: 3.7 g/dL (ref 3.5–5.0)
Alkaline Phosphatase: 68 U/L (ref 38–126)
BUN: 8 mg/dL (ref 6–20)
CO2: 23 mmol/L (ref 22–32)
CREATININE: 0.52 mg/dL (ref 0.44–1.00)
Calcium: 8.2 mg/dL — ABNORMAL LOW (ref 8.9–10.3)
Chloride: 103 mmol/L (ref 98–111)
GFR calc Af Amer: >60 mL/min (ref >60)
GFR calc non Af Amer: >60 mL/min (ref >60)
Glucose, Bld: 104 mg/dL — ABNORMAL HIGH (ref 70–99)
POTASSIUM: 3.2 mmol/L — AB (ref 3.5–5.1)
Sodium: 135 mmol/L (ref 135–145)
Total Bilirubin: 0.8 mg/dL (ref 0.3–1.2)
Total Protein: 7.1 g/dL (ref 6.5–8.1)

## 2018-01-06 LAB — LIPASE, BLOOD: Lipase: 25 U/L (ref 11–51)

## 2018-01-06 MED ORDER — BISACODYL 10 MG RE SUPP
10.0000 mg | Freq: Once | RECTAL | Status: AC
  Administered 2018-01-06: 10 mg via RECTAL
  Filled 2018-01-06: qty 1

## 2018-01-06 MED ORDER — ONDANSETRON HCL 4 MG/2ML IJ SOLN
4.0000 mg | Freq: Once | INTRAMUSCULAR | Status: AC
  Administered 2018-01-06: 4 mg via INTRAVENOUS
  Filled 2018-01-06: qty 2

## 2018-01-06 MED ORDER — SODIUM CHLORIDE 0.9 % IV SOLN
INTRAVENOUS | Status: DC
  Administered 2018-01-06: 20:00:00 via INTRAVENOUS

## 2018-01-06 MED ORDER — FENTANYL CITRATE (PF) 100 MCG/2ML IJ SOLN
100.0000 ug | INTRAMUSCULAR | Status: DC | PRN
  Administered 2018-01-06 (×2): 100 ug via INTRAVENOUS
  Filled 2018-01-06 (×2): qty 2

## 2018-01-06 MED ORDER — SODIUM CHLORIDE 0.9 % IV BOLUS
500.0000 mL | Freq: Once | INTRAVENOUS | Status: AC
  Administered 2018-01-06: 500 mL via INTRAVENOUS

## 2018-01-06 NOTE — ED Notes (Signed)
Pt able to produce a small bowel movement after enema. MD aware and verified sample

## 2018-01-06 NOTE — ED Notes (Signed)
Pt produced a solid medium bowel movement.

## 2018-01-06 NOTE — Anesthesia Postprocedure Evaluation (Signed)
Anesthesia Post Note  Patient: [name] NoeVeronica Garza-Cardoza  Procedure(s) Performed: ROBOTIC ASSISTED LAPAROSCOPIC VENTRAL/INCISIONAL HERNIA REPAIR (N/A ) ROBOTIC ASSISTED SALPINGECTOMY (Bilateral )  Patient location during evaluation: PACU Anesthesia Type: General Level of consciousness: awake and alert Pain management: pain level controlled Vital Signs Assessment: post-procedure vital signs reviewed and stable Respiratory status: spontaneous breathing, nonlabored ventilation, respiratory function stable and patient connected to nasal cannula oxygen Cardiovascular status: blood pressure returned to baseline and stable Postop Assessment: no apparent nausea or vomiting Anesthetic complications: no     Last Vitals:  Vitals:   01/04/18 1407 01/04/18 1500  BP: 110/63 121/75  Pulse: 84 77  Resp: 16 16  Temp: 36.6 C   SpO2: 99% 98%    Last Pain:  Vitals:   01/05/18 0801  TempSrc:   PainSc: 3                  Jovita GammaKathryn L Fitzgerald

## 2018-01-06 NOTE — ED Notes (Signed)
Daughter at bedside.

## 2018-01-06 NOTE — ED Notes (Signed)
Pt verbalized discharge instructions and follow up care. Pt's family drove her home.

## 2018-01-06 NOTE — ED Notes (Signed)
Called Dr Sterling Bigiego Pabon at [facility] for Dr Effie ShyWentz @2017 

## 2018-01-06 NOTE — Discharge Instructions (Addendum)
Use MiraLAX twice a day until you have daily soft bowel movements and no more abdominal discomfort.  Take Tylenol if needed for pain, and only use the narcotic pain reliever if needed.  We sent a urine culture to check and see if you have a urinary tract infection.

## 2018-01-06 NOTE — ED Provider Notes (Addendum)
[facility]-EMERGENCY DEPT Provider Note   CSN: [id number] Arrival date & time: 01/06/18  1647     History   Chief Complaint Chief Complaint  Patient presents with  . Emesis    HPI Katherine [name] is a 42 y.o. female.  HPI   She presents for evaluation of abdominal pain with vomiting, and abdominal swelling, for 2 days, since she had repair of an umbilical hernia, 3 days ago.  This procedure was combined with a bilateral tubal ligation.  She has not had a good bowel movement since the procedure.  She denies fever, blood in emesis, weakness or dizziness.  There are no other known modifying factors.  Past Medical History:  Diagnosis Date  . Abdominal pain   . Anemia   . Depression    postpartum depression  . Splenomegaly     Patient Active Problem List   Diagnosis Date Noted  . Ventral hernia without obstruction or gangrene   . Diabetes mellitus screening 05/08/2016  . NVD (normal vaginal delivery) 07/19/2015  . Normal labor and delivery 07/19/2015  . Active labor at term 07/18/2015  . Periumbilical abdominal pain 07/31/2013  . Spleen enlarged 07/31/2013  . History of anemia 07/31/2013  . Dental caries 09/06/2012    Past Surgical History:  Procedure Laterality Date  . nexplanon    . NO PAST SURGERIES    . ROBOTIC ASSISTED LAPAROSCOPIC VENTRAL/INCISIONAL HERNIA REPAIR N/A 01/04/2018   Procedure: ROBOTIC ASSISTED LAPAROSCOPIC VENTRAL/INCISIONAL HERNIA REPAIR;  Surgeon: Leafy RoPabon, [name] F, MD;  Location: [facility] ORS;  Service: General;  Laterality: N/A;  . ROBOTIC ASSISTED SALPINGO OOPHERECTOMY Bilateral 01/04/2018   Procedure: ROBOTIC ASSISTED SALPINGECTOMY;  Surgeon: Vena AustriaStaebler, Andreas, MD;  Location: [facility] ORS;  Service: Gynecology;  Laterality: Bilateral;  . UPPER GI ENDOSCOPY  2015     OB History    Gravida  6   Para  6   Term  6   Preterm      AB      Living  6     SAB      TAB      Ectopic      Multiple  0   Live  Births  6            Home Medications    Prior to Admission medications   Medication Sig Start Date End Date Taking? Authorizing Provider  etonogestrel (NEXPLANON) 68 MG IMPL implant 1 each by Subdermal route once.   Yes [provider]  HYDROcodone-acetaminophen (NORCO/VICODIN) 5-325 MG tablet Take 1-2 tablets by mouth every 6 (six) hours as needed for moderate pain. 01/04/18  Yes Pabon, Merri Rayiego F, MD    Family History Family History  Problem Relation Age of Onset  . Diabetes Father   . Kidney disease Brother     Social History Social History   Tobacco Use  . Smoking status: Never Smoker  . Smokeless tobacco: Never Used  Substance Use Topics  . Alcohol use: No  . Drug use: No     Allergies   Patient has no known allergies.   Review of Systems Review of Systems  All other systems reviewed and are negative.    Physical Exam Updated Vital Signs BP 129/90 (BP Location: Right Arm)   Pulse 73   Temp 98.3 F (36.8 C) (Oral)   Resp 18   LMP  (LMP Unknown)   SpO2 96%   Physical Exam Vitals signs and nursing note reviewed.  Constitutional:  Appearance: She is well-developed.  HENT:     Head: Normocephalic and atraumatic.  Eyes:     Conjunctiva/sclera: Conjunctivae normal.     Pupils: Pupils are equal, round, and reactive to light.  Neck:     Musculoskeletal: Normal range of motion and neck supple.     Trachea: Phonation normal.  Cardiovascular:     Rate and Rhythm: Normal rate and regular rhythm.  Pulmonary:     Effort: Pulmonary effort is normal.     Breath sounds: Normal breath sounds.  Chest:     Chest wall: No tenderness.  Abdominal:     General: There is no distension.     Palpations: Abdomen is soft. There is no mass.     Tenderness: There is abdominal tenderness (Diffuse, mild). There is no guarding or rebound.     Hernia: No hernia is present.     Comments: Well-healed abdominal laparoscopic incisions.  No associated drainage or  bleeding.  Musculoskeletal: Normal range of motion.  Skin:    General: Skin is warm and dry.  Neurological:     Mental Status: She is alert and oriented to person, place, and time.     Motor: No abnormal muscle tone.  Psychiatric:        Behavior: Behavior normal.        Thought Content: Thought content normal.        Judgment: Judgment normal.      ED Treatments / Results  Labs (all labs ordered are listed, but only abnormal results are displayed) Labs Reviewed  COMPREHENSIVE METABOLIC PANEL - Abnormal; Notable for the following components:      Result Value   Potassium 3.2 (*)    Glucose, Bld 104 (*)    Calcium 8.2 (*)    All other components within normal limits  CBC WITH DIFFERENTIAL/PLATELET - Abnormal; Notable for the following components:   WBC 18.2 (*)    Hemoglobin 11.8 (*)    Neutro Abs 14.8 (*)    Monocytes Absolute 1.1 (*)    Abs Immature Granulocytes 0.16 (*)    All other components within normal limits  URINALYSIS, ROUTINE W REFLEX MICROSCOPIC - Abnormal; Notable for the following components:   APPearance HAZY (*)    Hgb urine dipstick LARGE (*)    Ketones, ur 20 (*)    Bacteria, UA FEW (*)    All other components within normal limits  URINE CULTURE  LIPASE, BLOOD    EKG None  Radiology Dg Abd Acute W/chest  Result Date: 01/06/2018 CLINICAL DATA:  Abdominal pain and distension. Status post ventral hernia repair and salpingectomy 2 days ago. EXAM: DG ABDOMEN ACUTE W/ 1V CHEST COMPARISON:  Chest radiographs dated 06/20/2016. FINDINGS: Very poor inspiration with diffuse crowding of the pulmonary vasculature and lung markings. Interval mild bibasilar subsegmental atelectasis. No gross change in normal sized heart. Unremarkable bones. Gaseous distention of the majority of the colon with mild dilatation of the right colon, hepatic flexure and transverse colon. No significant air-fluid levels. Normal caliber small bowel loops. No free peritoneal air.  Unremarkable bones. IMPRESSION: 1. Very poor inspiration with mild bibasilar subsegmental atelectasis. 2. Colonic ileus. Electronically Signed   By: Beckie Salts M.D.   On: 01/06/2018 19:31    Procedures Procedures (including critical care time)  Medications Ordered in ED Medications  sodium chloride 0.9 % bolus 500 mL (0 mLs Intravenous Stopped 01/06/18 1900)  ondansetron (ZOFRAN) injection 4 mg (4 mg Intravenous Given 01/06/18 1750)  bisacodyl (  DULCOLAX) suppository 10 mg (10 mg Rectal Given 01/06/18 2016)     Initial Impression / Assessment and Plan / ED Course  I have reviewed the triage vital signs and the nursing notes.  Pertinent labs & imaging results that were available during my care of the patient were reviewed by me and considered in my medical decision making (see chart for details).  Clinical Course as of Jan 19 928  Thu Jan 06, 2018  2026 Normal except dipstick positive for blood, and ketones.  Microscopic positive for increased red cells, white cells and bacteria.  Urine culture sent.  Urinalysis, Routine w reflex microscopic(!) [EW]  2026 Abnormal, white count elevated  CBC with Differential(!) [EW]  2026 Normal  Lipase, blood [EW]  2026 Normal except potassium low, glucose high, calcium low  Comprehensive metabolic panel(!) [EW]  2027 Colonic ileus, images reviewed by me.  No bowel obstruction.  DG Abd Acute W/Chest [EW]  2027 Case discussed with general surgery, Dr. Aleen Campi, at [facility], on-call for her surgeon.  He recommends mobilizing bowels with to softener or enema.  She received a Dulcolax suppository and is attempting to have a bowel movement.   [EW]  2216 She had a moderately large bowel movement with firm stool, following soapsuds enema.  At this point abdomen is soft.  She is thirsty and is offered oral fluids.   [EW]    Clinical Course User Index [EW] Mancel Bale, MD     Patient Vitals for the past 24 hrs:  BP Temp  Temp src Pulse Resp SpO2  01/06/18 2305 129/90 - - 73 18 96 %  01/06/18 2015 (!) 141/78 - - 75 18 96 %  01/06/18 1705 140/76 98.3 F (36.8 C) Oral 76 17 95 %  01/06/18 1702 - - - - - 96 %    10:55 PM Reevaluation with update and discussion. After initial assessment and treatment, an updated evaluation reveals she is comfortable at this time and tolerating oral fluids.  5 discussed with patient and family members, questions answered. Mancel Bale   Medical Decision Making: Ileus, post repair umbilical hernia, by robotic approach.  Symptomatically improved after treatment with bisacodyl suppository and enema.  Doubt bowel obstruction.  Doubt wound infections.  No indication for further ED treatment or hospitalization at this time  CRITICAL CARE-no Performed by: Mancel Bale  Nursing Notes Reviewed/ Care Coordinated Applicable Imaging Reviewed Interpretation of Laboratory Data incorporated into ED treatment  The patient appears reasonably screened and/or stabilized for discharge and I doubt any other medical condition or other [facility] requiring further screening, evaluation, or treatment in the ED at this time prior to discharge.  Plan: Home Medications-continue usual medications, try to avoid opiates; Home Treatments-gradually advance diet; return here if the recommended treatment, does not improve the symptoms; Recommended follow up-General surgery PCP, PRN   Final Clinical Impressions(s) / ED Diagnoses   Final diagnoses:  Ileus [facility])    ED Discharge Orders    None       Mancel Bale, MD 01/07/18 1331    Mancel Bale, MD 01/18/18 [phone]

## 2018-01-06 NOTE — ED Triage Notes (Signed)
Pt had hernia repair surgery at [facility] on [facility]/10. She reports that she has been unable to control her pain because she cannot keep the pain meds prescribed down. She states that she was not given nausea medication. No fever or drainage from surgical site at home. A&Ox4. Spanish-speaking.

## 2018-01-06 NOTE — ED Notes (Signed)
Xray made aware that patient is declining a pregnancy test and will sign a waiver.

## 2018-01-07 ENCOUNTER — Telehealth: Payer: Self-pay

## 2018-01-07 NOTE — Telephone Encounter (Signed)
Left message through interpreter services to call office regarding scheduling an appointment for 01/10/18 due to recent ED visit for ileus.

## 2018-01-07 NOTE — Telephone Encounter (Signed)
-----   Message from Henrene DodgeJose Piscoya, MD sent at 01/07/2018  8:59 AM EST ----- Regarding: follow up Hi,  Dr. Effie ShyWentz from [facility] called me last night about this patient s/p robotic ventral hernia repair with [name] on 12/10 that presented with abdominal distention and colonic ileus on xray.  Did well with a soap suds enema and had a large bowel movement and felt much better and was discharged home.  Her follow up is not until 12/23, but I thought it'd be good to have her come over on Monday 12/16 to see [name].  Can y'all please call her and set up the appointment?  Thanks,  Visteon CorporationJose

## 2018-01-09 LAB — URINE CULTURE: Culture: >=100000 — AB

## 2018-01-10 ENCOUNTER — Telehealth: Payer: Self-pay | Admitting: Emergency Medicine

## 2018-01-10 NOTE — Telephone Encounter (Signed)
Patients appointment moved to 01/12/18 per Dr.Piscoys note listed below.  Spoke with [name] through interpreter services and confirmed.    Dr. Effie ShyWentz from [facility] called me last night about this patient s/p robotic ventral hernia repair with [name] on 12/10 that presented with abdominal distention and colonic ileus on xray. Did well with a soap suds enema and had a large bowel movement and felt much better and was discharged home. Her follow up is not until 12/23, but I thought it'd be good to have her come over on Monday 12/16 to see [name]. Can y'all please call her and set up the appointment?

## 2018-01-10 NOTE — Telephone Encounter (Signed)
Post ED Visit - Positive Culture Follow-up  Culture report reviewed by antimicrobial stewardship pharmacist:  []  Enzo BiNathan Batchelder, Pharm.D. []  Celedonio MiyamotoJeremy Frens, Pharm.D., BCPS AQ-ID []  Garvin FilaMike Maccia, Pharm.D., BCPS []  Georgina PillionElizabeth Martin, Pharm.D., BCPS []  AbbevilleMinh Pham, [address] BoulevardPharm.D., BCPS, AAHIVP []  Estella HuskMichelle Turner, Pharm.D., BCPS, AAHIVP []  Lysle Pearlachel Rumbarger, PharmD, BCPS []  Phillips Climeshuy Dang, PharmD, BCPS [x]  Agapito GamesAlison Masters, PharmD, BCPS []  Verlan FriendsErin Deja, PharmD  Positive urine culture No further patient follow-up is required at this time.  Carollee HerterShannon Missi Mcmackin 01/10/2018, 9:36 AM

## 2018-01-11 ENCOUNTER — Encounter: Payer: Self-pay | Admitting: Surgery

## 2018-01-12 ENCOUNTER — Other Ambulatory Visit: Payer: Self-pay

## 2018-01-12 ENCOUNTER — Ambulatory Visit (INDEPENDENT_AMBULATORY_CARE_PROVIDER_SITE_OTHER): Payer: Self-pay | Admitting: Obstetrics and Gynecology

## 2018-01-12 ENCOUNTER — Encounter: Payer: Self-pay | Admitting: Surgery

## 2018-01-12 ENCOUNTER — Encounter: Payer: Self-pay | Admitting: Obstetrics and Gynecology

## 2018-01-12 ENCOUNTER — Ambulatory Visit (INDEPENDENT_AMBULATORY_CARE_PROVIDER_SITE_OTHER): Payer: Self-pay | Admitting: Surgery

## 2018-01-12 VITALS — BP 130/81 | HR 94 | Temp 97.2°F | Ht 66.0 in | Wt 213.8 lb

## 2018-01-12 VITALS — BP 120/76 | HR 69 | Ht 62.99 in | Wt 214.0 lb

## 2018-01-12 DIAGNOSIS — Z09 Encounter for follow-up examination after completed treatment for conditions other than malignant neoplasm: Secondary | ICD-10-CM

## 2018-01-12 DIAGNOSIS — Z4889 Encounter for other specified surgical aftercare: Secondary | ICD-10-CM

## 2018-01-12 DIAGNOSIS — Z3046 Encounter for surveillance of implantable subdermal contraceptive: Secondary | ICD-10-CM

## 2018-01-12 NOTE — Progress Notes (Signed)
Postoperative Follow-up Patient presents post op from bilateral salpingectomy with concurrent ventral hernia repair by Dr. Elba BarmanPabon 1weeks ago for requested sterilization.  Subjective: Patient reports marked improvement in her preop symptoms. Eating a regular diet without difficulty. The patient is not having any pain.  Activity: normal activities of daily living.  Objective: Blood pressure 120/76, pulse 69, height 5' 2.99" (1.6 m), weight 214 lb (97.1 kg), not currently breastfeeding.  General: NAD Pulmonary: no increased work of breathing Abdomen: soft, non-tender, non-distended, incision(s) D/C/I Extremities: no edema Neurologic: normal gait   GYNECOLOGY PROCEDURE NOTE  Implanon removal discussed in detail.  Risks of infection, bleeding, nerve injury all reviewed.  Patient understands risks and desires to proceed.  Verbal consent obtained.  Patient is certain she wants the implanon removed.  All questions answered.  Procedure: Patient placed in dorsal supine with left arm above head, elbow flexed at 90 degrees, arm resting on examination table.  Implanon identified without problems.  Betadine scrub x3.  1 ml of 1% lidocaine injected under implanon device without problems.  Sterile gloves applied.  Small 0.5cm incision made at distal tip of implanon device with 11 blade scalpel.  Implanon brought to incision and grasped with a small kelly clamp.  Implanon removed intact without problems.  Pressure applied to incision.  Hemostasis obtained.  Steri-strips applied, followed by bandage and compression dressing.  Patient tolerated procedure well.  No complications.  Admission on 01/06/2018, Discharged on 01/06/2018  Component Date Value Ref Range Status  . Sodium 01/06/2018 135  135 - 145 mmol/L Final  . Potassium 01/06/2018 3.2* 3.5 - 5.1 mmol/L Final  . Chloride 01/06/2018 103  98 - 111 mmol/L Final  . CO2 01/06/2018 23  22 - 32 mmol/L Final  . Glucose, Bld 01/06/2018 104* 70 - 99  mg/dL Final  . BUN 16/10/960412/12/2017 8  6 - 20 mg/dL Final  . Creatinine, Ser 01/06/2018 0.52  0.44 - 1.00 mg/dL Final  . Calcium 54/09/811912/12/2017 8.2* 8.9 - 10.3 mg/dL Final  . Total Protein 01/06/2018 7.1  6.5 - 8.1 g/dL Final  . Albumin 14/78/295612/12/2017 3.7  3.5 - 5.0 g/dL Final  . AST 21/30/865712/12/2017 17  15 - 41 U/L Final  . ALT 01/06/2018 16  0 - 44 U/L Final  . Alkaline Phosphatase 01/06/2018 68  38 - 126 U/L Final  . Total Bilirubin 01/06/2018 0.8  0.3 - 1.2 mg/dL Final  . GFR calc non Af Amer 01/06/2018 >60  >60 mL/min Final  . GFR calc Af Amer 01/06/2018 >60  >60 mL/min Final  . Anion gap 01/06/2018 9  5 - 15 Final   Performed at [facility], [address] [place], [place] [id number]  . WBC 01/06/2018 18.2* 4.0 - 10.5 K/uL Final  . RBC 01/06/2018 4.27  3.87 - 5.11 MIL/uL Final  . Hemoglobin 01/06/2018 11.8* 12.0 - 15.0 g/dL Final  . HCT 29/52/841312/12/2017 36.1  36.0 - 46.0 % Final  . MCV 01/06/2018 84.5  80.0 - 100.0 fL Final  . MCH 01/06/2018 27.6  26.0 - 34.0 pg Final  . MCHC 01/06/2018 32.7  30.0 - 36.0 g/dL Final  . RDW 24/40/102712/12/2017 12.6  11.5 - 15.5 % Final  . Platelets 01/06/2018 288  150 - 400 K/uL Final  . nRBC 01/06/2018 0.0  0.0 - 0.2 % Final  . Neutrophils Relative % 01/06/2018 82  % Final  . Neutro Abs 01/06/2018 14.8* 1.7 - 7.7 K/uL Final  .  Lymphocytes Relative 01/06/2018 11  % Final  . Lymphs Abs 01/06/2018 2.0  0.7 - 4.0 K/uL Final  . Monocytes Relative 01/06/2018 6  % Final  . Monocytes Absolute 01/06/2018 1.1* 0.1 - 1.0 K/uL Final  . Eosinophils Relative 01/06/2018 0  % Final  . Eosinophils Absolute 01/06/2018 0.1  0.0 - 0.5 K/uL Final  . Basophils Relative 01/06/2018 0  % Final  . Basophils Absolute 01/06/2018 0.1  0.0 - 0.1 K/uL Final  . Immature Granulocytes 01/06/2018 1  % Final  . Abs Immature Granulocytes 01/06/2018 0.16* 0.00 - 0.07 K/uL Final   Performed at [facility], [address] [place], [place] [address]  . Color, Urine 01/06/2018  YELLOW  YELLOW Final  . APPearance 01/06/2018 HAZY* CLEAR Final  . Specific Gravity, Urine 01/06/2018 1.009  1.005 - 1.030 Final  . pH 01/06/2018 6.0  5.0 - 8.0 Final  . Glucose, UA 01/06/2018 NEGATIVE  NEGATIVE mg/dL Final  . Hgb urine dipstick 01/06/2018 LARGE* NEGATIVE Final  . Bilirubin Urine 01/06/2018 NEGATIVE  NEGATIVE Final  . Ketones, ur 01/06/2018 20* NEGATIVE mg/dL Final  . Protein, ur 56/21/3086 NEGATIVE  NEGATIVE mg/dL Final  . Nitrite 57/84/6962 NEGATIVE  NEGATIVE Final  . Leukocytes, UA 01/06/2018 NEGATIVE  NEGATIVE Final  . RBC / HPF 01/06/2018 21-50  0 - 5 RBC/hpf Final  . WBC, UA 01/06/2018 6-10  0 - 5 WBC/hpf Final  . Bacteria, UA 01/06/2018 FEW* NONE SEEN Final  . Squamous Epithelial / LPF 01/06/2018 0-5  0 - 5 Final  . Mucus 01/06/2018 PRESENT   Final   Performed at [facility], [address], [place], [place] [address]  . Lipase 01/06/2018 25  11 - 51 U/L Final   Performed at [facility], [address], [place], [place] [address]  . Specimen Description 01/06/2018    Final                   Value:URINE, CATHETERIZED Performed at [facility], [address] [place], [place] [address]   . Special Requests 01/06/2018    Final                   Value:NONE Performed at [facility], [address] [place], [place] [address]   . Culture 01/06/2018 >=100,000 COLONIES/mL ESCHERICHIA COLI*  Final  . Report Status 01/06/2018 01/09/2018 FINAL   Final  . Organism ID, Bacteria 01/06/2018 ESCHERICHIA COLI*  Final    Assessment: 42 y.o. s/p robotic BS, and ventral hernia repair stable  Plan: Patient has done well after surgery with no apparent complications.  I have discussed the post-operative course to date, and the expected progress moving forward.  The patient understands what complications to be concerned about.  I will see the patient in routine follow up, or sooner if needed.     Activity plan: No heavy lifting.   Plan: 1.  Patient given post procedure precautions and asked to call for fever, chills, redness or drainage from her incision, bleeding from incision.  She understands she will likely have a small bruise near site of removal and can remove bandage tomorrow and steri-strips in approximately 1 week.  2) Contraception - now s/p BTL    Vena Austria, MD, [facility] [place] [facility], [facility]

## 2018-01-12 NOTE — Patient Instructions (Signed)
Return as needed.The patient is aware to call back for any questions or concerns.  

## 2018-01-12 NOTE — Progress Notes (Signed)
S/p robotic ventral hernia 12/10 Doing well  developed ileus that has resolved Taking PO, + BM No n/v , no fevers  PE NAD Abd: soft, minimal incisional tenderness, no recurrence or infection. Wounds healing well  A/p Doing well No heavy lifting RTC prn

## 2018-01-17 ENCOUNTER — Encounter: Payer: Self-pay | Admitting: Surgery

## 2018-01-24 ENCOUNTER — Encounter (HOSPITAL_COMMUNITY): Payer: Self-pay | Admitting: Emergency Medicine

## 2018-01-24 ENCOUNTER — Other Ambulatory Visit: Payer: Self-pay

## 2018-01-24 ENCOUNTER — Emergency Department (HOSPITAL_COMMUNITY)
Admission: EM | Admit: 2018-01-24 | Discharge: 2018-01-25 | Disposition: A | Discharge status: Home / Self Care | Payer: Self-pay | Attending: Emergency Medicine | Admitting: Emergency Medicine

## 2018-01-24 DIAGNOSIS — R21 Rash and other nonspecific skin eruption: Secondary | ICD-10-CM | POA: Insufficient documentation

## 2018-01-24 MED ORDER — LEVOCETIRIZINE DIHYDROCHLORIDE 5 MG PO TABS: 5.0000 mg | ORAL_TABLET | Freq: Every evening | ORAL | 0 refills | Status: DC

## 2018-01-24 MED ORDER — TRIAMCINOLONE ACETONIDE 0.1 % EX CREA: 1.0000 "application " | TOPICAL_CREAM | Freq: Two times a day (BID) | CUTANEOUS | 0 refills | Status: DC

## 2018-01-24 NOTE — ED Notes (Signed)
Bed: WA11 Expected date:  Expected time:  Means of arrival:  Comments: 

## 2018-01-24 NOTE — ED Provider Notes (Signed)
[facility]-EMERGENCY DEPT Provider Note   CSN: [id number] Arrival date & time: 01/24/18  2140     History   Chief Complaint Chief Complaint  Patient presents with  . Rash    hands    HPI Katherine [name] is a 42 y.o. female.  There is a language barrier and translator is utilized.  Presents with rash on her hands.  Patient states that she developed an itchy and somewhat painful rash on the palmar surfaces of her hands 4 days ago.  She is taking Tylenol only.  She takes no other medications.  She denies any other family members with the same symptoms.  She has no new lotions or soaps.  She denies lesions on her feet or in her mouth.  She has not had any fever or chills.  HPI  Past Medical History:  Diagnosis Date  . Abdominal pain   . Anemia   . Depression    postpartum depression  . Splenomegaly     Patient Active Problem List   Diagnosis Date Noted  . Ventral hernia without obstruction or gangrene   . Diabetes mellitus screening 05/08/2016  . NVD (normal vaginal delivery) 07/19/2015  . Normal labor and delivery 07/19/2015  . Active labor at term 07/18/2015  . Periumbilical abdominal pain 07/31/2013  . Spleen enlarged 07/31/2013  . History of anemia 07/31/2013  . Dental caries 09/06/2012    Past Surgical History:  Procedure Laterality Date  . nexplanon    . NO PAST SURGERIES    . ROBOTIC ASSISTED LAPAROSCOPIC VENTRAL/INCISIONAL HERNIA REPAIR N/A 01/04/2018   Procedure: ROBOTIC ASSISTED LAPAROSCOPIC VENTRAL/INCISIONAL HERNIA REPAIR;  Surgeon: Leafy RoPabon, [name] F, MD;  Location: [facility] ORS;  Service: General;  Laterality: N/A;  . ROBOTIC ASSISTED SALPINGO OOPHERECTOMY Bilateral 01/04/2018   Procedure: ROBOTIC ASSISTED SALPINGECTOMY;  Surgeon: Vena AustriaStaebler, Andreas, MD;  Location: [facility] ORS;  Service: Gynecology;  Laterality: Bilateral;  . UPPER GI ENDOSCOPY  2015     OB History    Gravida  6   Para  6   Term  6   Preterm      AB      Living  6     SAB      TAB      Ectopic      Multiple  0   Live Births  6            Home Medications    Prior to Admission medications   Medication Sig Start Date End Date Taking? Authorizing Provider  acetaminophen (TYLENOL) 500 MG tablet Take 500-1,000 mg by mouth every 6 (six) hours as needed for mild pain.    Yes [provider]  HYDROcodone-acetaminophen (NORCO/VICODIN) 5-325 MG tablet Take 1-2 tablets by mouth every 6 (six) hours as needed for moderate pain. Patient not taking: Reported on 01/12/2018 01/04/18   Sterling BigPabon, [name] F, MD  levocetirizine (XYZAL) 5 MG tablet Take 1 tablet (5 mg total) by mouth every evening. 01/24/18   Arthor CaptainHarris, Eiliyah Reh, PA-C  triamcinolone cream (KENALOG) 0.1 % Apply 1 application topically 2 (two) times daily. 01/24/18   Arthor CaptainHarris, Janayia Burggraf, PA-C    Family History Family History  Problem Relation Age of Onset  . Diabetes Father   . Kidney disease Brother     Social History Social History   Tobacco Use  . Smoking status: Never Smoker  . Smokeless tobacco: Never Used  Substance Use Topics  . Alcohol use: No  . Drug use: No  Allergies   Patient has no known allergies.   Review of Systems Review of Systems  Ten systems reviewed and are negative for acute change, except as noted in the HPI.   Physical Exam Updated Vital Signs BP 124/81 (BP Location: Left Arm)   Pulse 72   Temp 98.4 F (36.9 C) (Oral)   Resp 16   Ht 5\' 2"  (1.575 m)   Wt 98 kg   LMP 01/24/2018   SpO2 98%   BMI 39.51 kg/m   Physical Exam Vitals signs and nursing note reviewed.  Constitutional:      General: She is not in acute distress.    Appearance: She is well-developed. She is not diaphoretic.  HENT:     Head: Normocephalic and atraumatic.  Eyes:     General: No scleral icterus.    Extraocular Movements: Extraocular movements intact.     Conjunctiva/sclera: Conjunctivae normal.     Pupils: Pupils are equal, round, and reactive to  light.  Neck:     Musculoskeletal: Normal range of motion.  Cardiovascular:     Rate and Rhythm: Normal rate and regular rhythm.     Heart sounds: Normal heart sounds. No murmur. No friction rub. No gallop.   Pulmonary:     Effort: Pulmonary effort is normal. No respiratory distress.     Breath sounds: Normal breath sounds.  Abdominal:     General: Bowel sounds are normal. There is no distension.     Palpations: Abdomen is soft. There is no mass.     Tenderness: There is no abdominal tenderness. There is no guarding.  Skin:    General: Skin is warm and dry.     Findings: Lesion present.     Comments: Multiple light purple colored coin sized lesions on the palmar surface.  Neurological:     Mental Status: She is alert and oriented to person, place, and time.  Psychiatric:        Behavior: Behavior normal.      ED Treatments / Results  Labs (all labs ordered are listed, but only abnormal results are displayed) Labs Reviewed  RPR  HIV ANTIBODY (ROUTINE TESTING W REFLEX)    EKG None  Radiology No results found.  Procedures Procedures (including critical care time)  Medications Ordered in ED Medications - No data to display   Initial Impression / Assessment and Plan / ED Course  I have reviewed the triage vital signs and the nursing notes.  Pertinent labs & imaging results that were available during my care of the patient were reviewed by me and considered in my medical decision making (see chart for details).     Patient with bilateral palmar rash.  Differential includes syphilis, hand-foot-and-mouth, dyshidrotic dermatitis, janeway lesions, measles, and RMSF. Patient has had no fever. She does not have a murmur or appear toxic. RPR/HIV drawn. Will dc with kenalog.Patient seen in shared visit with Dr. Read DriversMolpus;Marland Kitchen.   Final Clinical Impressions(s) / ED Diagnoses   Final diagnoses:  Rash    ED Discharge Orders         Ordered    triamcinolone cream (KENALOG) 0.1 %   2 times daily     01/24/18 2343    levocetirizine (XYZAL) 5 MG tablet  Every evening     01/24/18 2343           Arthor CaptainHarris, Gottlieb Zuercher, PA-C 01/24/18 2344    Molpus, Jonny RuizJohn, MD 01/25/18 [phone]

## 2018-01-24 NOTE — ED Triage Notes (Signed)
Patient complaining of rash on hands for 5 days. Patient is stating is itchy. Patient has not used any new items or ate any new foods.

## 2018-01-24 NOTE — Discharge Instructions (Signed)
Solicite ayuda inmediatamente si: °Tiene fiebre y los síntomas empeoran repentinamente. °Presenta confusión. °Tiene un dolor de cabeza intenso o rigidez en el cuello. °Tiene dolor intenso o rigidez en las articulaciones. °Tiene una convulsión. °Presenta una erupción que cubre todo o casi todo el cuerpo. La erupción puede o no ser dolorosa. °Le aparecen ampollas que: °Se encuentran arriba de la erupción. °Se agrandan o crecen juntas. °Son dolorosas. °Están dentro de la nariz o la boca. °Presenta una erupción que: °Tiene pequeñas manchas moradas, como si fueran pinchazos, en todo el cuerpo. °Tiene un “ojo de buey” o se parece a un blanco de tiro. °No está relacionada con una exposición al sol, está enrojecida y duele, y produce descamación de la piel. °

## 2018-01-26 LAB — RPR: RPR Ser Ql: NONREACTIVE

## 2018-01-26 LAB — HIV ANTIBODY (ROUTINE TESTING W REFLEX): HIV SCREEN 4TH GENERATION: NONREACTIVE

## 2018-02-02 ENCOUNTER — Ambulatory Visit: Payer: Self-pay | Admitting: Surgery

## 2018-02-02 ENCOUNTER — Encounter: Payer: Self-pay | Admitting: Surgery

## 2018-02-14 ENCOUNTER — Encounter: Payer: Self-pay | Admitting: Surgery

## 2018-02-15 ENCOUNTER — Encounter: Payer: Self-pay | Admitting: Obstetrics and Gynecology

## 2018-02-15 ENCOUNTER — Ambulatory Visit (INDEPENDENT_AMBULATORY_CARE_PROVIDER_SITE_OTHER): Payer: Self-pay | Admitting: Obstetrics and Gynecology

## 2018-02-15 VITALS — BP 128/64 | HR 69 | Wt 216.0 lb

## 2018-02-15 DIAGNOSIS — Z4889 Encounter for other specified surgical aftercare: Secondary | ICD-10-CM

## 2018-02-15 NOTE — Progress Notes (Signed)
      Postoperative Follow-up Patient presents post op from DaVinci bilateral salpingectomy and concurrent ventral hernia repair by Dr. Everlene Farrier 6weeks ago for undesired fertility and ventral hernia.  Subjective: Patient reports marked improvement in her preop symptoms. Eating a regular diet without difficulty. The patient is not having any pain.  Activity: normal activities of daily living.  Objective: Blood pressure 128/64, pulse 69, weight 216 lb (98 kg), last menstrual period 01/24/2018.  General: NAD Pulmonary: no increased work of breathing Abdomen: soft, non-tender, non-distended, incision(s) D/C/I Extremities: no edema Neurologic: normal gait   Admission on 01/24/2018, Discharged on 01/25/2018  Component Date Value Ref Range Status  . RPR Ser Ql 01/24/2018 Non Reactive  Non Reactive Final   Comment: (NOTE) Performed At: [facility] [address] [place], [place] [id number] Jolene Schimke MD YJ:[id number]   . HIV Screen 4th Generation wRfx 01/24/2018 Non Reactive  Non Reactive Final   Comment: (NOTE) Performed At: [facility] [address] [place], [place] [id number] Jolene Schimke MD YD:[id number]     Assessment: 43 y.o. s/p DaVinci bilateral salpingectomy and concurrent ventral hernia repair by Dr. Everlene Farrier stable  Plan: Patient has done well after surgery with no apparent complications.  I have discussed the post-operative course to date, and the expected progress moving forward.  The patient understands what complications to be concerned about.  I will see the patient in routine follow up, or sooner if needed.    Activity plan: No restriction.  Has had one short light 3 day menstrual cycle following removal of Nexplanon.  We discussed that should she experience menstrual irregularities, significant dysmenorrhea to bring these up as there are options available for cycle control even though she is no longer in need for contraceptives following her  salpingectomy.  Vena Austria, MD, [facility], [facility] 02/15/2018, 10:58 AM

## 2018-02-21 ENCOUNTER — Encounter: Payer: Self-pay | Admitting: Surgery

## 2018-02-23 ENCOUNTER — Other Ambulatory Visit: Payer: Self-pay

## 2018-02-23 ENCOUNTER — Encounter (HOSPITAL_COMMUNITY): Payer: Self-pay

## 2018-02-23 DIAGNOSIS — F329 Major depressive disorder, single episode, unspecified: Secondary | ICD-10-CM | POA: Insufficient documentation

## 2018-02-23 DIAGNOSIS — L519 Erythema multiforme, unspecified: Secondary | ICD-10-CM | POA: Insufficient documentation

## 2018-02-23 DIAGNOSIS — R531 Weakness: Secondary | ICD-10-CM | POA: Insufficient documentation

## 2018-02-23 DIAGNOSIS — R911 Solitary pulmonary nodule: Secondary | ICD-10-CM | POA: Insufficient documentation

## 2018-02-23 DIAGNOSIS — D72829 Elevated white blood cell count, unspecified: Secondary | ICD-10-CM | POA: Insufficient documentation

## 2018-02-23 MED ORDER — DIPHENHYDRAMINE HCL 25 MG PO CAPS
50.0000 mg | ORAL_CAPSULE | Freq: Once | ORAL | Status: AC
  Administered 2018-02-23: 50 mg via ORAL
  Filled 2018-02-23: qty 2

## 2018-02-23 NOTE — ED Triage Notes (Signed)
Pt reports generalized body pain, weakness, and rash all over body. Pt also reports bilateral lower leg pain. Pt states that back in December while she was on her period she had a rash on the palms of her hands. Pt reports she started her period 3 days ago and rash showed up shortly after. Rash noted on bilateral arms and abdomen. Pt reports rash is itchy and has tried using the cream she used for rash back in December but it does not work. Pt reports not taking anything different while on her period that would cause a reaction.

## 2018-02-24 ENCOUNTER — Emergency Department (HOSPITAL_COMMUNITY): Payer: Self-pay

## 2018-02-24 ENCOUNTER — Encounter (HOSPITAL_COMMUNITY): Payer: Self-pay

## 2018-02-24 ENCOUNTER — Emergency Department (HOSPITAL_COMMUNITY)
Admission: EM | Admit: 2018-02-24 | Discharge: 2018-02-24 | Disposition: A | Discharge status: Home / Self Care | Payer: Self-pay | Attending: Emergency Medicine | Admitting: Emergency Medicine

## 2018-02-24 DIAGNOSIS — D72829 Elevated white blood cell count, unspecified: Secondary | ICD-10-CM

## 2018-02-24 DIAGNOSIS — L519 Erythema multiforme, unspecified: Secondary | ICD-10-CM

## 2018-02-24 LAB — URINALYSIS, ROUTINE W REFLEX MICROSCOPIC
Bilirubin Urine: NEGATIVE
GLUCOSE, UA: NEGATIVE mg/dL
Ketones, ur: NEGATIVE mg/dL
Nitrite: POSITIVE — AB
PROTEIN: NEGATIVE mg/dL
Specific Gravity, Urine: 1.018 (ref 1.005–1.030)
pH: 5 (ref 5.0–8.0)

## 2018-02-24 LAB — BASIC METABOLIC PANEL
ANION GAP: 9 (ref 5–15)
BUN: 7 mg/dL (ref 6–20)
CALCIUM: 8.4 mg/dL — AB (ref 8.9–10.3)
CO2: 23 mmol/L (ref 22–32)
Chloride: 104 mmol/L (ref 98–111)
Creatinine, Ser: 0.57 mg/dL (ref 0.44–1.00)
GFR calc Af Amer: >60 mL/min (ref >60)
GFR calc non Af Amer: >60 mL/min (ref >60)
Glucose, Bld: 115 mg/dL — ABNORMAL HIGH (ref 70–99)
Potassium: 3.3 mmol/L — ABNORMAL LOW (ref 3.5–5.1)
Sodium: 136 mmol/L (ref 135–145)

## 2018-02-24 LAB — CBC WITH DIFFERENTIAL/PLATELET
Abs Immature Granulocytes: 0.12 10*3/uL — ABNORMAL HIGH (ref 0.00–0.07)
Basophils Absolute: 0.1 10*3/uL (ref 0.0–0.1)
Basophils Relative: 0 %
EOS PCT: 2 %
Eosinophils Absolute: 0.4 10*3/uL (ref 0.0–0.5)
HCT: 35.6 % — ABNORMAL LOW (ref 36.0–46.0)
Hemoglobin: 11.4 g/dL — ABNORMAL LOW (ref 12.0–15.0)
Immature Granulocytes: 1 %
Lymphocytes Relative: 18 %
Lymphs Abs: 2.9 10*3/uL (ref 0.7–4.0)
MCH: 27.2 pg (ref 26.0–34.0)
MCHC: 32 g/dL (ref 30.0–36.0)
MCV: 85 fL (ref 80.0–100.0)
Monocytes Absolute: 1.1 10*3/uL — ABNORMAL HIGH (ref 0.1–1.0)
Monocytes Relative: 6 %
Neutro Abs: 12 10*3/uL — ABNORMAL HIGH (ref 1.7–7.7)
Neutrophils Relative %: 73 %
Platelets: 305 10*3/uL (ref 150–400)
RBC: 4.19 MIL/uL (ref 3.87–5.11)
RDW: 12.8 % (ref 11.5–15.5)
WBC: 16.5 10*3/uL — ABNORMAL HIGH (ref 4.0–10.5)
nRBC: 0 % (ref 0.0–0.2)

## 2018-02-24 LAB — CK: Total CK: 45 U/L (ref 38–234)

## 2018-02-24 LAB — POC URINE PREG, ED: Preg Test, Ur: NEGATIVE

## 2018-02-24 MED ORDER — HYDROCODONE-ACETAMINOPHEN 5-325 MG PO TABS
1.0000 | ORAL_TABLET | Freq: Once | ORAL | Status: AC
  Administered 2018-02-24: 1 via ORAL
  Filled 2018-02-24: qty 1

## 2018-02-24 MED ORDER — NAPROXEN 500 MG PO TABS: 500.0000 mg | ORAL_TABLET | Freq: Two times a day (BID) | ORAL | 0 refills | Status: DC

## 2018-02-24 MED ORDER — NAPROXEN 500 MG PO TABS: 500.0000 mg | ORAL_TABLET | Freq: Once | ORAL | Status: DC

## 2018-02-24 MED ORDER — PREDNISONE 10 MG PO TABS: 10.0000 mg | ORAL_TABLET | Freq: Every day | ORAL | 0 refills | Status: DC

## 2018-02-24 MED ORDER — KETOROLAC TROMETHAMINE 60 MG/2ML IM SOLN
60.0000 mg | Freq: Once | INTRAMUSCULAR | Status: AC
  Administered 2018-02-24: 60 mg via INTRAMUSCULAR
  Filled 2018-02-24: qty 2

## 2018-02-24 MED ORDER — SODIUM CHLORIDE (PF) 0.9 % IJ SOLN
INTRAMUSCULAR | Status: AC
  Filled 2018-02-24: qty 50

## 2018-02-24 MED ORDER — IOHEXOL 300 MG/ML  SOLN
75.0000 mL | Freq: Once | INTRAMUSCULAR | Status: AC | PRN
  Administered 2018-02-24: 75 mL via INTRAVENOUS

## 2018-02-24 NOTE — ED Provider Notes (Signed)
[place] DEPT Provider Note   CSN: [id number] Arrival date & time: 02/23/18  2021     History   Chief Complaint Chief Complaint  Patient presents with  . Generalized Body Aches  . Rash    HPI Katherine [name] is a 43 y.o. female.  43 year old female with history of depression and splenomegaly presents to the emergency department for evaluation of a rash.  Notes onset of the rash approximately 3 days ago after onset of her menses.  Rash mildly pruritic and has been unrelieved with a topical cream.  Patient feels that the rash has been subjectively worsening since onset.  Symptoms have been associated with generalized body aches as well as generalized weakness.  She has been having some discomfort in her bilateral shins as well as her lower back.  Ambulation aggravates this pain.  She has not had any associated fevers, vomiting, incontinence, dysuria, hematuria, vaginal bleeding, vaginal discharge, numbness or difficulty swallowing.  No new soaps, lotions, detergents.  Denies any recent antibiotic use as well as frequent use of NSAIDs.    The patient was evaluated 1 month ago for similar complaints, but rash was present on bilateral palms at this time.  It spontaneously resolved.  She was tested for syphilis during this encounter which was negative.  HIV screen also negative.  She moved to the [place] from [place] 13 years ago.  The history is provided by the patient. A language interpreter was used (Daughter translating at bedside).  Rash    Past Medical History:  Diagnosis Date  . Abdominal pain   . Anemia   . Depression    postpartum depression  . Splenomegaly     Patient Active Problem List   Diagnosis Date Noted  . Ventral hernia without obstruction or gangrene   . Diabetes mellitus screening 05/08/2016  . NVD (normal vaginal delivery) 07/19/2015  . Normal labor and delivery 07/19/2015  . Active labor at term 07/18/2015  . Periumbilical  abdominal pain 07/31/2013  . Spleen enlarged 07/31/2013  . History of anemia 07/31/2013  . Dental caries 09/06/2012    Past Surgical History:  Procedure Laterality Date  . nexplanon    . NO PAST SURGERIES    . ROBOTIC ASSISTED LAPAROSCOPIC VENTRAL/INCISIONAL HERNIA REPAIR N/A 01/04/2018   Procedure: ROBOTIC ASSISTED LAPAROSCOPIC VENTRAL/INCISIONAL HERNIA REPAIR;  Surgeon: Jules Husbands, MD;  Location: [facility] ORS;  Service: General;  Laterality: N/A;  . ROBOTIC ASSISTED SALPINGO OOPHERECTOMY Bilateral 01/04/2018   Procedure: ROBOTIC ASSISTED SALPINGECTOMY;  Surgeon: Malachy Mood, MD;  Location: [facility] ORS;  Service: Gynecology;  Laterality: Bilateral;  . UPPER GI ENDOSCOPY  2015     OB History    Gravida  6   Para  6   Term  6   Preterm      AB      Living  6     SAB      TAB      Ectopic      Multiple  0   Live Births  6            Home Medications    Prior to Admission medications   Medication Sig Start Date End Date Taking? Authorizing Provider  acetaminophen (TYLENOL) 500 MG tablet Take 500-1,000 mg by mouth every 6 (six) hours as needed for mild pain.     [provider]    Family History Family History  Problem Relation Age of Onset  . Diabetes Father   .  Kidney disease Brother     Social History Social History   Tobacco Use  . Smoking status: Never Smoker  . Smokeless tobacco: Never Used  Substance Use Topics  . Alcohol use: No  . Drug use: No     Allergies   Patient has no known allergies.   Review of Systems Review of Systems  Skin: Positive for rash.  Ten systems reviewed and are negative for acute change, except as noted in the HPI.    Physical Exam Updated Vital Signs BP 105/67   Pulse 82   Temp 98.7 F (37.1 C) (Oral)   Resp 16   Ht 5\' 2"  (1.575 m)   Wt 96.2 kg   LMP 02/20/2018 Comment: negative urine pregnancy test 02/24/18  SpO2 97%   BMI 38.78 kg/m   Physical Exam Vitals signs and nursing note  reviewed.  Constitutional:      General: She is not in acute distress.    Appearance: She is well-developed. She is not diaphoretic.     Comments: Obese and nontoxic.  HENT:     Head: Normocephalic and atraumatic.     Right Ear: External ear normal.     Left Ear: External ear normal.  Eyes:     General: No scleral icterus.    Conjunctiva/sclera: Conjunctivae normal.  Neck:     Musculoskeletal: Normal range of motion.     Comments: No meningismus Cardiovascular:     Rate and Rhythm: Normal rate and regular rhythm.     Pulses: Normal pulses.  Pulmonary:     Effort: Pulmonary effort is normal. No respiratory distress.     Breath sounds: No stridor. No wheezing or rales.     Comments: Lungs CTAB Musculoskeletal: Normal range of motion.  Lymphadenopathy:     Comments: Suspect lymphadenopathy in neck as well as axilla and groin, though somewhat difficult to discern from subcutaneous fat given habitus.  Skin:    General: Skin is warm and dry.     Coloration: Skin is not pale.     Findings: No erythema.     Comments: Erythematous, blanching target lesions noted mostly to bilateral proximal arms as well as around waist.  Neurological:     Mental Status: She is alert and oriented to person, place, and time.     Coordination: Coordination normal.     Comments: Moving all extremities spontaneously. Ambulatory without assistance.  Psychiatric:        Behavior: Behavior normal.      ED Treatments / Results  Labs (all labs ordered are listed, but only abnormal results are displayed) Labs Reviewed  URINALYSIS, ROUTINE W REFLEX MICROSCOPIC - Abnormal; Notable for the following components:      Result Value   APPearance HAZY (*)    Hgb urine dipstick LARGE (*)    Nitrite POSITIVE (*)    Leukocytes, UA SMALL (*)    Bacteria, UA FEW (*)    All other components within normal limits  CBC WITH DIFFERENTIAL/PLATELET - Abnormal; Notable for the following components:   WBC 16.5 (*)     Hemoglobin 11.4 (*)    HCT 35.6 (*)    Neutro Abs 12.0 (*)    Monocytes Absolute 1.1 (*)    Abs Immature Granulocytes 0.12 (*)    All other components within normal limits  BASIC METABOLIC PANEL - Abnormal; Notable for the following components:   Potassium 3.3 (*)    Glucose, Bld 115 (*)    Calcium 8.4 (*)  All other components within normal limits  URINE CULTURE  CK  POC URINE PREG, ED    EKG None  Radiology No results found.  Procedures Procedures (including critical care time)  Medications Ordered in ED Medications  sodium chloride (PF) 0.9 % injection (has no administration in time range)  diphenhydrAMINE (BENADRYL) capsule 50 mg (50 mg Oral Given 02/23/18 2104)  HYDROcodone-acetaminophen (NORCO/VICODIN) 5-325 MG per tablet 1 tablet (1 tablet Oral Given 02/24/18 0359)  ketorolac (TORADOL) injection 60 mg (60 mg Intramuscular Given 02/24/18 0439)  iohexol (OMNIPAQUE) 300 MG/ML solution 75 mL (75 mLs Intravenous Contrast Given 02/24/18 0710)     Initial Impression / Assessment and Plan / ED Course  I have reviewed the triage vital signs and the nursing notes.  Pertinent labs & imaging results that were available during my care of the patient were reviewed by me and considered in my medical decision making (see chart for details).     43 year old female presents to the emergency department for evaluation of a rash.  Her rash is consistent with erythema multiforme.  She denies any recent antibiotic use or frequent use of NSAIDs.  Also noting associated generalized weakness as well as some pain in her bilateral shins.  She is ambulatory without difficulty and neurovascularly intact.  Urine was ordered in triage and findings are consistent with contamination.  I do not believe the patient warrants treatment for a UTI at this time.  She is afebrile and has no complaints of dysuria.  We will send urine culture.  The patient does have a leukocytosis today which is fairly similar  compared to 1 month ago.  No history of leukocytosis at evaluation 1 year ago.  On exam, there is suspicion for lymphadenopathy in the anterior cervical chain, bilateral axilla, groin.  Given leukocytosis, lymphadenopathy, erythema nodosum, plan to obtain chest CT to evaluate for formal lymphadenopathy.  There is concerned that symptoms may be representative of lymphoma.    If CT findings are abnormal, would advise admission for further work-up as the patient is uninsured and would likely be lost to follow-up.  If CT scan findings do not show lymphadenopathy, I believe she is stable for discharge and continued follow-up with her primary care doctor.  In a setting of normal imaging, I would have a higher suspicion for rheumatologic disorder contributing to her erythema multiforme today.  Viral etiology may also be considered and would be self limiting.  She has a hx of negative RPR in the setting of palmar rash 1 month ago.  Patient signed out to Janetta Hora, PA-C at shift change who will follow-up on imaging and disposition appropriately.  Vitals:   02/23/18 2027 02/24/18 0430 02/24/18 0656  BP: 109/81 125/68 105/67  Pulse: 94 81 82  Resp: 14 16 16   Temp: 98.7 F (37.1 C)    TempSrc: Oral    SpO2: 95% 99% 97%  Weight: 96.2 kg    Height: 5\' 2"  (1.575 m)      Final Clinical Impressions(s) / ED Diagnoses   Final diagnoses:  Erythema multiforme  Leukocytosis, unspecified type    ED Discharge Orders    None       Antonietta Breach, PA-C 02/24/18 0719    Palumbo, April, MD 02/24/18 2322

## 2018-02-24 NOTE — Discharge Instructions (Addendum)
Please follow up with your primary doctor Take Prednisone 10mg  for rash and joint pain Take Naproxen twice a day for joint pain

## 2018-02-26 LAB — URINE CULTURE: Culture: >=100000 — AB

## 2018-02-27 ENCOUNTER — Telehealth: Payer: Self-pay | Admitting: Emergency Medicine

## 2018-02-27 NOTE — Telephone Encounter (Signed)
Post ED Visit - Positive Culture Follow-up  Culture report reviewed by antimicrobial stewardship pharmacist:  []  Enzo Bi, Pharm.D. []  Celedonio Miyamoto, Pharm.D., BCPS AQ-ID [x]  Garvin Fila, Pharm.D., BCPS []  Georgina Pillion, [address].D., BCPS []  [place], [address].D., BCPS, AAHIVP []  Estella Husk, Pharm.D., BCPS, AAHIVP []  Lysle Pearl, PharmD, BCPS []  Phillips Climes, PharmD, BCPS []  Agapito Games, PharmD, BCPS []  Verlan Friends, PharmD  Positive urine culture Contaminant,  no further patient follow-up is required at this time.  Carollee Herter Swain [place] 02/27/2018, 12:49 PM

## 2018-02-27 NOTE — Progress Notes (Signed)
ED Antimicrobial Stewardship Positive Culture Follow Up   Katherine [name] is an 43 y.o. female who presented to [facility] on 02/24/2018 with a chief complaint of  Chief Complaint  Patient presents with  . Generalized Body Aches  . Rash    Recent Results (from the past 720 hour(s))  Urine culture     Status: Abnormal   Collection Time: 02/24/18 12:44 AM  Result Value Ref Range Status   Specimen Description   Final    URINE, CLEAN CATCH Performed at [facility], [address], [place], [place] [address]    Special Requests   Final    NONE Performed at [facility], [address], [place], [place] [address]    Culture >=100,000 COLONIES/mL ESCHERICHIA COLI (A)  Final   Report Status 02/26/2018 FINAL  Final   Organism ID, Bacteria ESCHERICHIA COLI (A)  Final      Susceptibility   Escherichia coli - MIC*    AMPICILLIN >=32 RESISTANT Resistant     CEFAZOLIN <=4 SENSITIVE Sensitive     CEFTRIAXONE <=1 SENSITIVE Sensitive     CIPROFLOXACIN <=0.25 SENSITIVE Sensitive     GENTAMICIN <=1 SENSITIVE Sensitive     IMIPENEM <=0.25 SENSITIVE Sensitive     NITROFURANTOIN <=16 SENSITIVE Sensitive     TRIMETH/SULFA <=20 SENSITIVE Sensitive     AMPICILLIN/SULBACTAM 16 INTERMEDIATE Intermediate     PIP/TAZO <=4 SENSITIVE Sensitive     Extended ESBL NEGATIVE Sensitive     * >=100,000 COLONIES/mL ESCHERICHIA COLI   No abx indicated  ED Provider: [place] Robinson, PA-C  Bertram Millard 02/27/2018, 8:45 AM Clinical Pharmacist Monday - Friday phone -  [phone] Saturday - Sunday phone - [phone]

## 2018-03-02 ENCOUNTER — Encounter: Payer: Self-pay | Admitting: Surgery

## 2018-03-02 ENCOUNTER — Other Ambulatory Visit: Payer: Self-pay

## 2018-03-02 ENCOUNTER — Ambulatory Visit (INDEPENDENT_AMBULATORY_CARE_PROVIDER_SITE_OTHER): Payer: Self-pay | Admitting: Surgery

## 2018-03-02 VITALS — BP 118/76 | HR 50 | Temp 97.9°F | Resp 12 | Ht 62.0 in | Wt 214.0 lb

## 2018-03-02 DIAGNOSIS — Z09 Encounter for follow-up examination after completed treatment for conditions other than malignant neoplasm: Secondary | ICD-10-CM

## 2018-03-02 NOTE — Patient Instructions (Signed)
The patient is aware to call back for any questions or new concerns.  GENERAL POST-OPERATIVE PATIENT INSTRUCTIONS   WOUND CARE INSTRUCTIONS:  Keep a dry clean dressing on the wound if there is drainage. The initial bandage may be removed after 24 hours.  Once the wound has quit draining you may leave it open to air.  If clothing rubs against the wound or causes irritation and the wound is not draining you may cover it with a dry dressing during the daytime.  Try to keep the wound dry and avoid ointments on the wound unless directed to do so.  If the wound becomes bright red and painful or starts to drain infected material that is not clear, please contact your physician immediately.  If the wound is mildly pink and has a thick firm ridge underneath it, this is normal, and is referred to as a healing ridge.  This will resolve over the next 4-6 weeks.  BATHING: You may shower if you have been informed of this by your surgeon. However, Please do not submerge in a tub, hot tub, or pool until incisions are completely sealed or have been told by your surgeon that you may do so.  DIET:  You may eat any foods that you can tolerate.  It is a good idea to eat a high fiber diet and take in plenty of fluids to prevent constipation.  If you do become constipated you may want to take a mild laxative or take ducolax tablets on a daily basis until your bowel habits are regular.  Constipation can be very uncomfortable, along with straining, after recent surgery.  ACTIVITY:  You are encouraged to cough and deep breath or use your incentive spirometer if you were given one, every 15-30 minutes when awake.  This will help prevent respiratory complications and low grade fevers post-operatively if you had a general anesthetic.  You may want to hug a pillow when coughing and sneezing to add additional support to the surgical area, if you had abdominal or chest surgery, which will decrease pain during these times.  You are  encouraged to walk and engage in light activity for the next two weeks.   Twenty pounds is roughly equivalent to a plastic bag of groceries. At that time- Listen to your body when lifting, if you have pain when lifting, stop and then try again in a few days. Soreness after doing exercises or activities of daily living is normal as you get back in to your normal routine.  MEDICATIONS:  Try to take narcotic medications and anti-inflammatory medications, such as tylenol, ibuprofen, naprosyn, etc., with food.  This will minimize stomach upset from the medication.  Should you develop nausea and vomiting from the pain medication, or develop a rash, please discontinue the medication and contact your physician.  You should not drive, make important decisions, or operate machinery when taking narcotic pain medication.  SUNBLOCK Use sun block to incision area over the next year if this area will be exposed to sun. This helps decrease scarring and will allow you avoid a permanent darkened area over your incision.  QUESTIONS:  Please feel free to call our office if you have any questions, and we will be glad to assist you.  

## 2018-03-02 NOTE — Progress Notes (Signed)
S/ rb ventral H Doing very well  No complications no pain  PE NAD Incisions c/d/i, no recurrence or infection  A/P Doing very well No complications RTC prn

## 2018-03-03 ENCOUNTER — Inpatient Hospital Stay: Payer: Self-pay | Admitting: Primary Care

## 2018-04-20 ENCOUNTER — Ambulatory Visit: Payer: Self-pay | Attending: Family Medicine | Admitting: Physician Assistant

## 2018-04-20 ENCOUNTER — Other Ambulatory Visit: Payer: Self-pay

## 2018-04-20 VITALS — BP 118/78 | HR 53 | Temp 98.5°F | Resp 16 | Wt 221.4 lb

## 2018-04-20 DIAGNOSIS — M545 Low back pain, unspecified: Secondary | ICD-10-CM

## 2018-04-20 DIAGNOSIS — J069 Acute upper respiratory infection, unspecified: Secondary | ICD-10-CM

## 2018-04-20 MED ORDER — METHOCARBAMOL 500 MG PO TABS: 500.0000 mg | ORAL_TABLET | Freq: Three times a day (TID) | ORAL | 0 refills | Status: DC | PRN

## 2018-04-20 MED ORDER — NAPROXEN 500 MG PO TABS: 500.0000 mg | ORAL_TABLET | Freq: Two times a day (BID) | ORAL | 0 refills | Status: DC

## 2018-04-20 NOTE — Patient Instructions (Signed)
lacrilube eye drops for comfort    Infeccin de las vas respiratorias superiores, en adultos Upper Respiratory Infection, Adult Una infeccin de las vas respiratorias superiores (IVRS) afecta la nariz, la garganta y las vas respiratorias superiores. Las IVRS son causadas por microbios (virus). El tipo ms comn de IVRS es el resfro comn. Las IVRS no se curan con medicamentos, pero hay ciertas cosas que puede hacer en su casa para aliviar los sntomas. Una IVRS suele mejorar en el transcurso de 7 a 10 das. Siga estas indicaciones en su casa: Actividad  Descanse todo lo que sea necesario.  Si tiene fiebre, [employer], sin ir al Aleen Campi o a la escuela, hasta que ya no tenga fiebre, o hasta que el mdico le indique que puede regresar al Aleen Campi o a la escuela. ? Office manager en su casa hasta que ya no pueda propagar (contagiar) la infeccin. ? Es posible que el [employer] indique que use una mascarilla para tener menos riesgo de propagar la infeccin. Para aliviar los sntomas  [place] grgaras con una mezcla de agua y sal 3 o 4veces al da, o cuando sea necesario. Para preparar la mezcla de agua y sal, disuelva totalmente de media a 1cucharadita de sal en 1taza de agua tibia.  Use un humidificador de aire fro para agregar humedad al aire. Esto puede ayudarlo a que respire mejor. Qu debe comer y beber   Beba suficiente lquido para mantener la orina de color amarillo plido.  Tome sopas y caldos transparentes. Instrucciones generales   [employer] de venta libre y los recetados solamente como se lo haya indicado el mdico. Estos incluyen medicamentos para el resfro, para bajar la fiebre y antitusivos.  No consuma ningn producto que contenga nicotina o tabaco. Esto incluye cigarrillos y cigarrillos electrnicos. Si necesita ayuda para dejar de fumar, consulte al mdico.  Evite estar cerca de personas que fuman (evite el humo ambiental de tabaco).   Asegrese de vacunarse regularmente y recibir la vacuna contra la gripe todos los [place].  Concurra a todas las visitas de [address] se lo haya indicado el mdico. Esto es importante. Cmo evitar la propagacin de la infeccin a Ship broker las manos frecuentemente con agua y [place]. Use un desinfectante para manos si no dispone de [place] y [place].  Evite tocarse la boca, la cara, los ojos o la [place].  Tosa o estornude en un pauelo de papel o sobre su manga o codo. No tosa o estornude al aire ni se cubra la boca o la nariz con la [place]. Comunquese con un mdico si:  Siente que empeora o que no mejora.  Tiene alguno de estos sntomas: ? Grant Ruts. ? Escalofros. ? Mucosidad color marrn o roja en la nariz. ? Lquido amarillento o amarronado (Doctor, hospital de la Clinical cytogeneticist. ? Dolor en la cara, especialmente al inclinarse hacia adelante. ? Ganglios del cuello inflamados. ? Dolor al tragar. ? Zonas blancas en la parte de atrs de la garganta. Solicite ayuda de inmediato si:  La falta de aire empeora.  Los siguientes sntomas son muy intensos o constantes: ? Dolor de [place]. ? Dolor de odo. ? Dolor en la frente, detrs de los ojos y por encima de los pmulos (dolor sinusal). ? Journalist, newspaper.  Tiene una enfermedad pulmonar prolongada (crnica) junto con cualquiera de estos sntomas: ? Sibilancias. ? Tos prolongada. ? Tos con Montez Hageman. ? Cambio en la mucosidad habitual.  Presenta rigidez en  el cuello.  Tiene cambios en: ? La visin. ? La audicin. ? El razonamiento. ? El [place] de nimo. Resumen  Una infeccin de las vas respiratorias superiores (IVRS) es causada por un microbio llamado virus. El tipo ms comn de IVRS es el resfro comn.  Una IVRS suele mejorar en el transcurso de 7 a 10 das.  Tome los medicamentos de venta libre y los recetados solamente como se lo haya indicado el mdico. Esta informacin no tiene Theme park manager el consejo  del mdico. Asegrese de hacerle al mdico cualquier pregunta que tenga. Document Released: 06/16/2010 Document Revised: 11/13/2016 Document Reviewed: 11/13/2016 Elsevier Interactive Patient Education  2019 [employer].

## 2018-04-20 NOTE — Progress Notes (Signed)
Patient ID: Katherine [name], female   DOB: Jan 04, 1976, 43 y.o.   MRN: [id number]   Katherine [name], is a 43 y.o. female  [id number]  [id number]  DOB - November 13, 1975  Subjective:  Chief Complaint and HPI: Katherine [name] is a 43 y.o. female here today with R eye redness, no drainage/discharge.  No fever.  No ST, cough.  She has had a mild HA and back ache.  No urinary/pelvic s/sx.  tylenol helped temporarily with backache and HA. She does say that her symptoms started 3-4 days ago and started lessening yesterday.    Jose with [employer] translating.     ROS:   Constitutional:  No f/c, No night sweats, No unexplained weight loss. EENT:  No vision changes, No blurry vision, No hearing changes. No mouth, throat, or ear problems.  Respiratory: No cough, No SOB Cardiac: No CP, no palpitations GI:  No abd pain, No N/V/D. GU: No Urinary s/sx Musculoskeletal: + back ache no other joint pain Neuro: + headache, no dizziness, no motor weakness.  Skin: No rash Endocrine:  No polydipsia. No polyuria.  Psych: Denies SI/HI  No problems updated.  ALLERGIES: No Known Allergies  PAST MEDICAL HISTORY: Past Medical History:  Diagnosis Date  . Abdominal pain   . Anemia   . Depression    postpartum depression  . Splenomegaly     MEDICATIONS AT HOME: Prior to Admission medications   Medication Sig Start Date End Date Taking? Authorizing Provider  acetaminophen (TYLENOL) 500 MG tablet Take 500-1,000 mg by mouth every 6 (six) hours as needed for mild pain.     [provider]  methocarbamol (ROBAXIN) 500 MG tablet Take 1 tablet (500 mg total) by mouth every 8 (eight) hours as needed for muscle spasms. 04/20/18   Anders Simmonds, PA-C  naproxen (NAPROSYN) 500 MG tablet Take 1 tablet (500 mg total) by mouth 2 (two) times daily with a meal. 04/20/18   Anders Simmonds, PA-C     Objective:  EXAM:   Vitals:   04/20/18 0933  BP: 118/78  Pulse: (!)  53  Resp: 16  Temp: 98.5 F (36.9 C)  TempSrc: Oral  SpO2: 95%  Weight: 221 lb 6.4 oz (100.4 kg)    General appearance : A&OX3. NAD. Non-toxic-appearing HEENT: Atraumatic and Normocephalic.  PERRLA. EOM intact.  R bulbar conjunctiva is pinkish w/o chemosis.  No discharge.  L yey WNL.  Fundi benign B. TM clear B. Mouth-MMM, post pharynx WNL w/o erythema, No PND. Neck: supple, no JVD. No cervical lymphadenopathy. No thyromegaly Chest/Lungs:  Breathing-non-labored, Good air entry bilaterally, breath sounds normal without rales, rhonchi, or wheezing  CVS: S1 S2 regular, no murmurs, gallops, rubs  Back:  Full S&ROM.  Neg SLR.  DTR=intact B.  +paraspinus spasm that is tender in the lumbar region.   Extremities: Bilateral Lower Ext shows no edema, both legs are warm to touch with = pulse throughout Neurology:  CN II-XII grossly intact, Non focal.   Psych:  TP linear. J/I WNL. Normal speech. Appropriate eye contact and affect.  Skin:  No Rash  Data Review Lab Results  Component Value Date   HGBA1C 5.4 05/08/2016     Assessment & Plan   1. Acute low back pain without sciatica, unspecified back pain laterality No red flags - naproxen (NAPROSYN) 500 MG tablet; Take 1 tablet (500 mg total) by mouth 2 (two) times daily with a meal.  Dispense: 60 tablet; Refill: 0 - methocarbamol (ROBAXIN) 500 MG  tablet; Take 1 tablet (500 mg total) by mouth every 8 (eight) hours as needed for muscle spasms.  Dispense: 60 tablet; Refill: 0  2. Viral upper respiratory tract infection Fluids, rest, respiratory care.  lacrilube for eye comfort and compresses.  No signs bacterial conjunctivitis.     Patient have been counseled extensively about nutrition and exercise  Return if symptoms worsen or fail to improve.  The patient was given clear instructions to go to ER or return to medical center if symptoms don't improve, worsen or new problems develop. The patient verbalized understanding. The patient was  told to call to get lab results if they haven't heard anything in the next week.     Georgian Co, PA-C [facility] [place], [place] [phone]   04/20/2018, 9:46 AM

## 2018-05-31 ENCOUNTER — Other Ambulatory Visit: Payer: Self-pay

## 2018-05-31 ENCOUNTER — Ambulatory Visit: Payer: Self-pay | Attending: Primary Care | Admitting: Primary Care

## 2018-09-01 ENCOUNTER — Ambulatory Visit: Payer: Self-pay | Attending: Family Medicine | Admitting: Physician Assistant

## 2018-09-01 ENCOUNTER — Other Ambulatory Visit: Payer: Self-pay

## 2018-09-01 DIAGNOSIS — B029 Zoster without complications: Secondary | ICD-10-CM

## 2018-09-01 MED ORDER — NAPROXEN 500 MG PO TABS: 500.0000 mg | ORAL_TABLET | Freq: Two times a day (BID) | ORAL | 0 refills | Status: DC

## 2018-09-01 MED ORDER — VALACYCLOVIR HCL 1 G PO TABS: 1000.0000 mg | ORAL_TABLET | Freq: Three times a day (TID) | ORAL | 0 refills | Status: DC

## 2018-09-01 MED FILL — NAPROXEN 500 MG TABLET: 500 | 30 days supply | Qty: 60 | Fill #0

## 2018-09-01 MED FILL — valACYclovir HCL 1 GM TABS: 1 | 7 days supply | Qty: 21 | Fill #0

## 2018-09-01 NOTE — Progress Notes (Signed)
Patient ID: Katherine [name], female   DOB: 10-30-1975, 43 y.o.   MRN: [id number] Virtual Visit via Telephone Note  I connected with Darlina Rumpf on 09/01/18 at  9:10 AM EDT by telephone and verified that I am speaking with the correct person using two identifiers.   I discussed the limitations, risks, security and privacy concerns of performing an evaluation and management service by telephone and the availability of in person appointments. I also discussed with the patient that there may be a patient responsible charge related to this service. The patient expressed understanding and agreed to proceed.  Patient location:  Home  My Location:  [place] office Persons on the call:  Me, interpreter, and the patient(Jose)   History of Present Illness:    L side of lower face feels numb, tingling, and mild pain.  No rash. Also some throbbing and pain in L ear.  No vision changes. No neurological deficits/slurred speech or motor difficulties.  No fever.  No known exposure to Covid.  No sneezing, runny nose, cough.  No extremity symptoms.     Observations/Objective:  A&Ox3   Assessment and Plan: 1. Herpes zoster without complication Likely shingles prodrome.  If any lesions develop on the tip of nose or the eye, to ED.  If motor/neurological deficits occur, to ED.  Patient agrees.   - valACYclovir (VALTREX) 1000 MG tablet; Take 1 tablet (1,000 mg total) by mouth 3 (three) times daily.  Dispense: 21 tablet; Refill: 0 - naproxen (NAPROSYN) 500 MG tablet; Take 1 tablet (500 mg total) by mouth 2 (two) times daily with a meal. Prn pain  Dispense: 60 tablet; Refill: 0   Follow Up Instructions: Assign new PCP in 2 months;  Sooner if needed   I discussed the assessment and treatment plan with the patient. The patient was provided an opportunity to ask questions and all were answered. The patient agreed with the plan and demonstrated an understanding of the instructions.   The patient was  advised to call back or seek an in-person evaluation if the symptoms worsen or if the condition fails to improve as anticipated.  I provided 12 minutes of non-face-to-face time during this encounter.   Freeman Caldron, PA-C

## 2018-09-01 NOTE — Progress Notes (Signed)
Patient has been called and DOB has been verified. Patient has been screened and transferred to PCP to start phone visit.  Patient is having numbness in nose eyes, and pain in ear.

## 2018-09-05 ENCOUNTER — Other Ambulatory Visit: Payer: Self-pay

## 2018-09-05 ENCOUNTER — Ambulatory Visit: Payer: Self-pay | Attending: Family Medicine

## 2018-11-02 ENCOUNTER — Other Ambulatory Visit: Payer: Self-pay

## 2018-11-02 ENCOUNTER — Encounter: Payer: Self-pay | Admitting: Family Medicine

## 2018-11-02 ENCOUNTER — Ambulatory Visit: Payer: Self-pay | Admitting: Pharmacist

## 2018-11-02 ENCOUNTER — Ambulatory Visit: Payer: Self-pay

## 2018-11-02 ENCOUNTER — Ambulatory Visit: Payer: Self-pay | Attending: Family Medicine | Admitting: Family Medicine

## 2018-11-02 DIAGNOSIS — R7309 Other abnormal glucose: Secondary | ICD-10-CM

## 2018-11-02 DIAGNOSIS — K5900 Constipation, unspecified: Secondary | ICD-10-CM

## 2018-11-02 DIAGNOSIS — R102 Pelvic and perineal pain: Secondary | ICD-10-CM

## 2018-11-02 DIAGNOSIS — R1033 Periumbilical pain: Secondary | ICD-10-CM

## 2018-11-02 DIAGNOSIS — R1024 Suprapubic pain: Secondary | ICD-10-CM

## 2018-11-02 NOTE — Progress Notes (Signed)
Virtual Visit via Telephone Note  I connected with Darlina Rumpf on 11/02/18 at  8:50 AM EDT by telephone and verified that I am speaking with the correct person using two identifiers.   I discussed the limitations, risks, security and privacy concerns of performing an evaluation and management service by telephone and the availability of in person appointments. I also discussed with the patient that there may be a patient responsible charge related to this service. The patient expressed understanding and agreed to proceed.  Patient Location: Home Provider Location: [facility] Others participating in call: Call initiated by [place], [place] who then obtained Spanish speaking interpreter through [facility] interpretation servicesMickel Baas ID# [id number]   History of Present Illness:        43 year old female who last had telemedicine visit on 09/01/2018 with another provider due to complaint of left facial numbness/abnormal sensation.  Patient was diagnosed with possible shingles and placed on Valtrex.  Per patient, she did not develop a rash after her visit and the abnormal facial sensation has improved.          Patient has current complaint of discomfort in the area of her bellybutton and below.  She also complains of sensation of colic at that area and feels as if she is having mild discomfort in her ovaries.  She is status post ventral hernia repair and bilateral salpingectomy on 01/04/2018.  History also includes emergency department visit 01/06/2018 for ileus after surgery.  She denies any current nausea/vomiting or diarrhea.  She reports chronic issues with constipation for which she is currently taking MiraLAX which she says helps somewhat.  She denies any blood in the stools and no black stools.  She denies any urinary frequency, urgency or dysuria.  She is unable to quantify or qualify the pain other than stating that it feels like colic.         On further review of systems, she denies  any current headaches or dizziness, no chest pain or palpitations, no sore throat or difficulty swallowing.  No shortness of breath or cough.  No fever or chills.  No peripheral edema.            Past Medical History:  Diagnosis Date  . Abdominal pain   . Anemia   . Depression    postpartum depression  . Splenomegaly     Past Surgical History:  Procedure Laterality Date  . nexplanon    . NO PAST SURGERIES    . ROBOTIC ASSISTED LAPAROSCOPIC VENTRAL/INCISIONAL HERNIA REPAIR N/A 01/04/2018   Procedure: ROBOTIC ASSISTED LAPAROSCOPIC VENTRAL/INCISIONAL HERNIA REPAIR;  Surgeon: Jules Husbands, MD;  Location: [facility] ORS;  Service: General;  Laterality: N/A;  . ROBOTIC ASSISTED SALPINGO OOPHERECTOMY Bilateral 01/04/2018   Procedure: ROBOTIC ASSISTED SALPINGECTOMY;  Surgeon: Malachy Mood, MD;  Location: [facility] ORS;  Service: Gynecology;  Laterality: Bilateral;  . UPPER GI ENDOSCOPY  2015    Family History  Problem Relation Age of Onset  . Diabetes Father   . Kidney disease Brother     Social History   Tobacco Use  . Smoking status: Never Smoker  . Smokeless tobacco: Never Used  Substance Use Topics  . Alcohol use: No  . Drug use: No     No Known Allergies     Observations/Objective: No vital signs or physical exam conducted as visit was done via telephone  Assessment and Plan: 1. Suprapubic discomfort Patient will come into lab later today for lab visit to give urine sample  to check for urinalysis due to her suprapubic discomfort.  Patient will also have CBC to look for elevated white blood cell count or anemia as well as BMP to look for electrolyte abnormality.  She is also being referred to GYN in follow-up of lower abdominal discomfort and prior ventral hernia repair. - Basic Metabolic Panel; Future - CBC with Differential; Future - POCT URINALYSIS DIP (CLINITEK) - Ambulatory referral to Gynecology  *Addendum, patient did come in for blood work but either UA was not  obtained or not resulted  2. Periumbilical discomfort; 3.  Constipation Patient will be referred back to gynecology for further evaluation as she is status post ventral hernia repair and describes pain/discomfort in this area.  Discussed with the patient that constipation could also be causing her discomfort as she is encouraged to take a over-the-counter laxative such as Ex-Lax or Senokot in addition to her MiraLAX to see if this helps to improve her abdominal discomfort.  She will also come in today for lab visit including BMP, CBC and TSH in follow-up of her abdominal discomfort and constipation. - Basic Metabolic Panel; Future - CBC with Differential; Future - Ambulatory referral to Gynecology - TSH; Future  4. Elevated glucose Patient has had elevated glucose as well as hypokalemia prior labs.  Patient will have BMP and hemoglobin A1c later today when she comes in for lab visit. - Hemoglobin A1c; Future  Follow Up Instructions:Return in about 2 weeks (around 11/16/2018) for abdominal pain-sooner if needed.    I discussed the assessment and treatment plan with the patient. The patient was provided an opportunity to ask questions and all were answered. The patient agreed with the plan and demonstrated an understanding of the instructions.   The patient was advised to call back or seek an in-person evaluation if the symptoms worsen or if the condition fails to improve as anticipated.  I provided 16  minutes of non-face-to-face time during this encounter.   Cain Saupe, MD

## 2018-11-02 NOTE — Progress Notes (Signed)
Patient verified DOB Patient has not eaten today. Patient has not taken medication today. Patient complains of pain located in the back and the pain is scaled at a 7.  Patient would like her flu vaccine.

## 2018-11-03 LAB — CBC WITH DIFFERENTIAL/PLATELET
Basophils Absolute: 0.1 x10E3/uL (ref 0.0–0.2)
Basos: 1 %
EOS (ABSOLUTE): 0.2 x10E3/uL (ref 0.0–0.4)
Eos: 2 %
Hematocrit: 37.3 % (ref 34.0–46.6)
Hemoglobin: 12.5 g/dL (ref 11.1–15.9)
Immature Grans (Abs): 0.2 x10E3/uL — ABNORMAL HIGH (ref 0.0–0.1)
Immature Granulocytes: 1 %
Lymphocytes Absolute: 3.3 x10E3/uL — ABNORMAL HIGH (ref 0.7–3.1)
Lymphs: 27 %
MCH: 27.8 pg (ref 26.6–33.0)
MCHC: 33.5 g/dL (ref 31.5–35.7)
MCV: 83 fL (ref 79–97)
Monocytes Absolute: 0.9 x10E3/uL (ref 0.1–0.9)
Monocytes: 7 %
Neutrophils Absolute: 7.7 x10E3/uL — ABNORMAL HIGH (ref 1.4–7.0)
Neutrophils: 62 %
Platelets: 325 x10E3/uL (ref 150–450)
RBC: 4.49 x10E6/uL (ref 3.77–5.28)
RDW: 13 % (ref 11.7–15.4)
WBC: 12.3 x10E3/uL — ABNORMAL HIGH (ref 3.4–10.8)

## 2018-11-03 LAB — BASIC METABOLIC PANEL WITH GFR
BUN/Creatinine Ratio: 18 (ref 9–23)
BUN: 11 mg/dL (ref 6–24)
CO2: 24 mmol/L (ref 20–29)
Calcium: 9.4 mg/dL (ref 8.7–10.2)
Chloride: 103 mmol/L (ref 96–106)
Creatinine, Ser: 0.6 mg/dL (ref 0.57–1.00)
GFR calc Af Amer: 130 mL/min/1.73
GFR calc non Af Amer: 113 mL/min/1.73
Glucose: 92 mg/dL (ref 65–99)
Potassium: 3.9 mmol/L (ref 3.5–5.2)
Sodium: 139 mmol/L (ref 134–144)

## 2018-11-03 LAB — HEMOGLOBIN A1C
Est. average glucose Bld gHb Est-mCnc: 114 mg/dL
Hgb A1c MFr Bld: 5.6 % (ref 4.8–5.6)

## 2018-11-03 LAB — TSH: TSH: 1.41 u[IU]/mL (ref 0.450–4.500)

## 2018-11-08 ENCOUNTER — Telehealth: Payer: Self-pay | Admitting: Family Medicine

## 2018-11-08 ENCOUNTER — Encounter: Payer: Self-pay | Admitting: Physician Assistant

## 2018-11-08 ENCOUNTER — Ambulatory Visit: Payer: Self-pay | Admitting: Pharmacist

## 2018-11-08 NOTE — Telephone Encounter (Signed)
Patient called back for her lab results. Please follow up.

## 2018-11-09 NOTE — Telephone Encounter (Signed)
Informed patient with results and she verbalized understanding.  

## 2018-12-07 ENCOUNTER — Other Ambulatory Visit: Payer: Self-pay

## 2018-12-07 ENCOUNTER — Encounter: Payer: Self-pay | Admitting: Obstetrics & Gynecology

## 2018-12-07 ENCOUNTER — Ambulatory Visit (INDEPENDENT_AMBULATORY_CARE_PROVIDER_SITE_OTHER): Payer: Self-pay | Admitting: Obstetrics & Gynecology

## 2018-12-07 VITALS — BP 111/72 | HR 67 | Ht 65.0 in | Wt 218.7 lb

## 2018-12-07 DIAGNOSIS — R1013 Epigastric pain: Secondary | ICD-10-CM

## 2018-12-07 MED ORDER — DOCUSATE SODIUM 100 MG PO CAPS: 100.0000 mg | ORAL_CAPSULE | Freq: Two times a day (BID) | ORAL | 2 refills | Status: DC | PRN

## 2018-12-07 NOTE — Progress Notes (Signed)
Patient ID: Katherine [name], female   DOB: 09-02-1975, 43 y.o.   MRN: [id number]  Chief Complaint  Patient presents with  . Gynecologic Exam   F/u abdominal pain s/p ventral hernia repair HPI Katherine [name] is a 43 y.o. female.  [id number] Patient's last menstrual period was 11/21/2018. Referred for evaluation of continued low abdominal and periumbilical pain after surgery done a [facility] one year ago. Ventral hernia repair and BS was done with robot.She has had pain since then and difficulty with hard bowel movements. HPI  Past Medical History:  Diagnosis Date  . Abdominal pain   . Anemia   . Depression    postpartum depression  . Splenomegaly     Past Surgical History:  Procedure Laterality Date  . nexplanon    . NO PAST SURGERIES    . ROBOTIC ASSISTED LAPAROSCOPIC VENTRAL/INCISIONAL HERNIA REPAIR N/A 01/04/2018   Procedure: ROBOTIC ASSISTED LAPAROSCOPIC VENTRAL/INCISIONAL HERNIA REPAIR;  Surgeon: Jules Husbands, MD;  Location: [facility] ORS;  Service: General;  Laterality: N/A;  . ROBOTIC ASSISTED SALPINGO OOPHERECTOMY Bilateral 01/04/2018   Procedure: ROBOTIC ASSISTED SALPINGECTOMY;  Surgeon: Malachy Mood, MD;  Location: [facility] ORS;  Service: Gynecology;  Laterality: Bilateral;  . TUBAL LIGATION    . UPPER GI ENDOSCOPY  2015    Family History  Problem Relation Age of Onset  . Diabetes Father   . Kidney disease Brother     Social History Social History   Tobacco Use  . Smoking status: Never Smoker  . Smokeless tobacco: Never Used  Substance Use Topics  . Alcohol use: No  . Drug use: No    No Known Allergies  Current Outpatient Medications  Medication Sig Dispense Refill  . acetaminophen (TYLENOL) 500 MG tablet Take 500-1,000 mg by mouth every 6 (six) hours as needed for mild pain.     . methocarbamol (ROBAXIN) 500 MG tablet Take 1 tablet (500 mg total) by mouth every 8 (eight) hours as needed for muscle spasms. 60 tablet 0  . naproxen (NAPROSYN) 500 MG  tablet Take 1 tablet (500 mg total) by mouth 2 (two) times daily with a meal. Prn pain 60 tablet 0  . valACYclovir (VALTREX) 1000 MG tablet Take 1 tablet (1,000 mg total) by mouth 3 (three) times daily. 21 tablet 0   No current facility-administered medications for this visit.     Review of Systems Review of Systems  Respiratory: Negative.   Gastrointestinal: Positive for abdominal pain and constipation (daily but has to strain).  Genitourinary: Negative for menstrual problem.    Blood pressure 111/72, pulse 67, height 5\' 5"  (1.651 m), weight 218 lb 11.2 oz (99.2 kg), last menstrual period 11/21/2018.  Physical Exam Physical Exam Constitutional:      Appearance: Normal appearance.  Pulmonary:     Effort: Pulmonary effort is normal.  Abdominal:     General: Abdomen is flat.     Palpations: Abdomen is soft.     Tenderness: There is no abdominal tenderness.     Hernia: No hernia is present.  Neurological:     Mental Status: She is alert.     Data Reviewed Op note from 2019  Assessment Post op abdominal sx after hernia repair, doesn't appear to be gyn related  Plan Recommend f/u with her genl surgeon Colace and metamucil daily    Katherine [name] 12/07/2018, 2:22 PM

## 2018-12-07 NOTE — Progress Notes (Signed)
Ptr states is having pain & inflammation in hernia area.Pt had hernia & tubal111/72

## 2018-12-07 NOTE — Patient Instructions (Signed)
Reparacin laparoscpica de una hernia ventral, cuidados posteriores Laparoscopic Ventral Hernia Repair, Care After Lea esta informacin sobre cmo cuidarse despus del procedimiento. Su mdico tambin podr darle indicaciones ms especficas. Comunquese con su mdico si tiene problemas o preguntas. Qu puedo esperar despus del procedimiento? Despus del procedimiento, es comn [employer] siguientes sntomas:  Dolor, molestias o sensibilidad. Siga estas indicaciones en su casa: Cuidados de la incisin   Siga las indicaciones del mdico acerca del cuidado de la incisin. Haga lo siguiente: ? Lvese las manos con agua y jabn antes de Quarry manager las vendas (vendajes) o antes de tocarse el abdomen. Use desinfectante para manos si no dispone de [place]. ? Cambie los vendajes como se lo haya indicado el mdico. ? No retire los puntos (suturas), la goma para cerrar la piel o las tiras [place]. Es posible que estos cierres cutneos deban quedar puestos en la piel durante 2semanas o ms tiempo. Si los bordes de las tiras adhesivas empiezan a despegarse y Therapist, sports, puede recortar los que estn sueltos. No retire las tiras [facility] por completo a menos que el mdico se lo indique.  [place] zona de la incisin para detectar signos de infeccin. Est atento a los siguientes signos: ? Dolor, hinchazn o enrojecimiento. ? Lquido o sangre. ? Calor. ? Pus o mal olor. Baarse   No tome baos de inmersin, no nade ni use el jacuzzi hasta que el mdico lo autorice. Pregntele al mdico si puede ducharse. Thurston Pounds solo le permitan tomar baos de [place].  Mantenga la venda (vendaje) seca hasta que el mdico le diga que se la puede quitar. Actividad  No levante ningn objeto que pese ms de 10libras (4,5kg) hasta que el mdico lo autorice.  No conduzca ni use maquinaria pesada mientras toma analgsicos recetados. Pregntele al mdico cundo es seguro volver a Forensic psychologist o usar  maquinaria pesada.  No conduzca durante 24horas si recibi un medicamento para ayudarlo a relajarse (sedante) durante el procedimiento.  Haga reposo como se lo haya indicado el mdico. Pregntele al mdico cundo puede retomar sus actividades normales. Instrucciones generales  [employer] de venta libre y los recetados solamente como se lo haya indicado el mdico.  A fin de prevenir o tratar el estreimiento mientras toma analgsicos recetados, el mdico puede recomendarle lo siguiente: ? Tome medicamentos recetados o de venta libre. ? Consumir alimentos ricos en fibra, como frutas y verduras frescas, cereales integrales y frijoles. ? Limite el consumo de alimentos ricos en grasa y azcares procesados, como alimentos fritos o dulces.  Beba suficiente lquido para Consulting civil engineer orina clara o de color amarillo plido.  Sostenga una [employer] abdomen cuando tosa o estornude. Esto ayuda a Best boy.  Concurra a todas las visitas de control como se lo haya indicado el mdico. Esto es importante. Comunquese con un mdico si:  Tiene los siguientes sntomas: ? Cristy Hilts o escalofros. ? Enrojecimiento, hinchazn o dolor alrededor de la incisin. ? Lquido o [employer] de la incisin. ? Pus o mal olor en TEFL teacher de la incisin. ? Un dolor que empeora o que no mejora con los medicamentos. ? Nuseas o vmitos. ? Tos. ? Falta de aire.  La incisin est caliente al tacto.  No ha defecado durante tresdas.  No puede orinar. Solicite ayuda de inmediato si:  Siente un dolor intenso en el abdomen.  Tiene nuseas o vmitos persistentes.  Tiene enrojecimiento, calor o dolor en la pierna.  Siente dolor en el pecho.  Tiene dificultad para respirar. Resumen  Despus de [address] [place] procedimiento, es comn tener dolor, [place] o sensibilidad.  Siga las indicaciones del mdico acerca del cuidado de la incisin.  Controle todos los das la zona de la incisin para  detectar signos de infeccin. Avsele al mdico sobre cualquier signo de infeccin.  Concurra a todas las visitas de control como se lo haya indicado el mdico. Esto es importante. Esta informacin no tiene Theme park manager el consejo del mdico. Asegrese de hacerle al mdico cualquier pregunta que tenga. Document Released: 12/30/2011 Document Revised: 12/02/2016 Document Reviewed: 11/04/2011 Elsevier Patient Education  2020 [employer].

## 2019-01-11 ENCOUNTER — Other Ambulatory Visit: Payer: Self-pay

## 2019-01-11 ENCOUNTER — Ambulatory Visit: Payer: Self-pay | Attending: Family Medicine | Admitting: Physician Assistant

## 2019-01-11 VITALS — BP 118/83 | HR 66 | Temp 98.7°F | Ht 62.0 in | Wt 218.6 lb

## 2019-01-11 DIAGNOSIS — N926 Irregular menstruation, unspecified: Secondary | ICD-10-CM

## 2019-01-11 DIAGNOSIS — R8281 Pyuria: Secondary | ICD-10-CM

## 2019-01-11 LAB — POCT URINALYSIS DIP (CLINITEK)
Bilirubin, UA: NEGATIVE
Blood, UA: NEGATIVE
Glucose, UA: NEGATIVE mg/dL
Ketones, POC UA: NEGATIVE mg/dL
Nitrite, UA: NEGATIVE
POC PROTEIN,UA: NEGATIVE
Spec Grav, UA: 1.01 (ref 1.010–1.025)
Urobilinogen, UA: 0.2 E.U./dL
pH, UA: 6 (ref 5.0–8.0)

## 2019-01-11 LAB — POCT URINE PREGNANCY: Preg Test, Ur: NEGATIVE

## 2019-01-11 NOTE — Progress Notes (Signed)
Katherine [name], is a 43 y.o. female  JOA:[id number]  SWF:[id number]  DOB - 03-01-1975  Subjective:  Chief Complaint and HPI: Katherine [name] is a 43 y.o. female here today c/o irregular bleeding.  She has had 3 episodes of bleeding in the last 4 weeks.  Each episode has been about 2-3 days and lots of clots in the blood.  She has had her tubes tied about 1 year ago.  No pelvic pain.  No vaginal discharge.  No burning with urination.  No N/V/D.  No fever.  Usually her periods are normal and occur about every 4 weeks.  Usually last about 4-5 days.  Last pap ~2 yrs ago and normal.  Gilberto Better with status interpreters translating.  ROS:   Constitutional:  No f/c, No night sweats, No unexplained weight loss. EENT:  No vision changes, No blurry vision, No hearing changes. No mouth, throat, or ear problems.  Respiratory: No cough, No SOB Cardiac: No CP, no palpitations GI:  No abd pain, No N/V/D. GU: No Urinary s/sx Musculoskeletal: No joint pain Neuro: No headache, no dizziness, no motor weakness.  Skin: No rash Endocrine:  No polydipsia. No polyuria.  Psych: Denies SI/HI  No problems updated.  ALLERGIES: No Known Allergies  PAST MEDICAL HISTORY: Past Medical History:  Diagnosis Date  . Abdominal pain   . Anemia   . Depression    postpartum depression  . Splenomegaly     MEDICATIONS AT HOME: Prior to Admission medications   Medication Sig Start Date End Date Taking? Authorizing Provider  methocarbamol (ROBAXIN) 500 MG tablet Take 1 tablet (500 mg total) by mouth every 8 (eight) hours as needed for muscle spasms. 04/20/18  Yes Freeman Caldron M, PA-C  naproxen (NAPROSYN) 500 MG tablet Take 1 tablet (500 mg total) by mouth 2 (two) times daily with a meal. Prn pain 09/01/18  Yes ,  M, PA-C  acetaminophen (TYLENOL) 500 MG tablet Take 500-1,000 mg by mouth every 6 (six) hours as needed for mild pain.     [provider]  docusate sodium (COLACE)  100 MG capsule Take 1 capsule (100 mg total) by mouth 2 (two) times daily as needed. Patient not taking: Reported on 01/11/2019 12/07/18   Woodroe Mode, MD  valACYclovir (VALTREX) 1000 MG tablet Take 1 tablet (1,000 mg total) by mouth 3 (three) times daily. Patient not taking: Reported on 01/11/2019 09/01/18   Argentina Donovan, PA-C     Objective:  EXAM:   Vitals:   01/11/19 1027  BP: 118/83  Pulse: 66  Temp: 98.7 F (37.1 C)  TempSrc: Oral  SpO2: 97%  Weight: 218 lb 9.6 oz (99.2 kg)  Height: 5\' 2"  (1.575 m)    General appearance : A&OX3. NAD. Non-toxic-appearing HEENT: Atraumatic and Normocephalic.  PERRLA. EOM intact.  Neck: supple, no JVD. No cervical lymphadenopathy. No thyromegaly Chest/Lungs:  Breathing-non-labored, Good air entry bilaterally, breath sounds normal without rales, rhonchi, or wheezing  CVS: S1 S2 regular, no murmurs, gallops, rubs  Extremities: Bilateral Lower Ext shows no edema, both legs are warm to touch with = pulse throughout Neurology:  CN II-XII grossly intact, Non focal.   Psych:  TP linear. J/I WNL. Normal speech. Appropriate eye contact and affect.  Skin:  No Rash  Data Review Lab Results  Component Value Date   HGBA1C 5.6 11/02/2018   HGBA1C 5.4 05/08/2016     Assessment & Plan   1. Menstrual abnormality - POCT URINALYSIS DIP (CLINITEK) - POCT  urine pregnancy - hCG, quantitative, pregnancy - Ambulatory referral to Gynecology  2. Pyuria No symptoms - Urine culture  3. Irregular bleeding - TSH - CBC with Differential - Comprehensive metabolic panel - hCG, quantitative, pregnancy - Ambulatory referral to Gynecology A1C reviewed <3 months ago and normal  stratus interpreters used and additional time performing visit was required.  Patient have been counseled extensively about nutrition and exercise  Return in about 6 months (around 07/12/2019) for PCP for chronic issues.  The patient was given clear instructions to go to ER  or return to medical center if symptoms don't improve, worsen or new problems develop. The patient verbalized understanding. The patient was told to call to get lab results if they haven't heard anything in the next week.     Georgian Co, PA-C [facility] [place], [place] [phone]   01/11/2019, 11:14 AMPatient ID: Katherine [name], female   DOB: 04-Mar-1975, 43 y.o.   MRN: [id number]

## 2019-01-11 NOTE — Progress Notes (Signed)
Pt. Stated she had her menstrual cycle 3x this month and there were blood clots.   Pt. Stated when she get agitated she gets chest pressure.

## 2019-01-12 ENCOUNTER — Telehealth (INDEPENDENT_AMBULATORY_CARE_PROVIDER_SITE_OTHER): Payer: Self-pay

## 2019-01-12 LAB — COMPREHENSIVE METABOLIC PANEL
ALT: 15 IU/L (ref 0–32)
AST: 16 IU/L (ref 0–40)
Albumin/Globulin Ratio: 1.3 (ref 1.2–2.2)
Albumin: 4.3 g/dL (ref 3.8–4.8)
Alkaline Phosphatase: 110 IU/L (ref 39–117)
BUN/Creatinine Ratio: 23 (ref 9–23)
BUN: 11 mg/dL (ref 6–24)
Bilirubin Total: 0.4 mg/dL (ref 0.0–1.2)
CO2: 23 mmol/L (ref 20–29)
Calcium: 8.9 mg/dL (ref 8.7–10.2)
Chloride: 103 mmol/L (ref 96–106)
Creatinine, Ser: 0.47 mg/dL — ABNORMAL LOW (ref 0.57–1.00)
GFR calc Af Amer: 141 mL/min/{1.73_m2} (ref >59)
GFR calc non Af Amer: 122 mL/min/{1.73_m2} (ref >59)
Globulin, Total: 3.3 g/dL (ref 1.5–4.5)
Glucose: 96 mg/dL (ref 65–99)
Potassium: 4.3 mmol/L (ref 3.5–5.2)
Sodium: 140 mmol/L (ref 134–144)
Total Protein: 7.6 g/dL (ref 6.0–8.5)

## 2019-01-12 LAB — CBC WITH DIFFERENTIAL/PLATELET
Basophils Absolute: 0.1 10*3/uL (ref 0.0–0.2)
Basos: 1 %
EOS (ABSOLUTE): 0.2 10*3/uL (ref 0.0–0.4)
Eos: 2 %
Hematocrit: 38.4 % (ref 34.0–46.6)
Hemoglobin: 12.8 g/dL (ref 11.1–15.9)
Immature Grans (Abs): 0.1 10*3/uL (ref 0.0–0.1)
Immature Granulocytes: 1 %
Lymphocytes Absolute: 2.9 10*3/uL (ref 0.7–3.1)
Lymphs: 26 %
MCH: 27.6 pg (ref 26.6–33.0)
MCHC: 33.3 g/dL (ref 31.5–35.7)
MCV: 83 fL (ref 79–97)
Monocytes Absolute: 0.6 10*3/uL (ref 0.1–0.9)
Monocytes: 5 %
Neutrophils Absolute: 7.2 10*3/uL — ABNORMAL HIGH (ref 1.4–7.0)
Neutrophils: 65 %
Platelets: 335 10*3/uL (ref 150–450)
RBC: 4.63 x10E6/uL (ref 3.77–5.28)
RDW: 12.4 % (ref 11.7–15.4)
WBC: 11.1 10*3/uL — ABNORMAL HIGH (ref 3.4–10.8)

## 2019-01-12 LAB — TSH: TSH: 1.21 u[IU]/mL (ref 0.450–4.500)

## 2019-01-12 LAB — BETA HCG QUANT (REF LAB): hCG Quant: <1 m[IU]/mL

## 2019-01-12 NOTE — Telephone Encounter (Signed)
Call placed using pacific interpreter [phone]) patient verified date of birth. She is aware that blood work did not reveal any abnormalities that would explain her abnormal bleeding. Advised her to drink more water for healthier kidney function. She is aware that she has been referred to gynecology for further work up. She expressed understanding of results. Nat Christen, CMA

## 2019-01-12 NOTE — Telephone Encounter (Signed)
-----   Message from Argentina Donovan, [place] sent at 01/12/2019  9:27 AM EST ----- Please call patient.  Blood work did not reveal any abnormalities that would explain why she had abnormal bleeding.  Drink more water for healthier kidney function. I have referred her to a gynecologist for further workup.  Thanks, Freeman Caldron, PA-C

## 2019-01-13 ENCOUNTER — Other Ambulatory Visit: Payer: Self-pay | Admitting: Physician Assistant

## 2019-01-13 MED ORDER — AMOXICILLIN 500 MG PO CAPS: 500.0000 mg | ORAL_CAPSULE | Freq: Three times a day (TID) | ORAL | 0 refills | Status: DC

## 2019-01-13 MED ORDER — FLUCONAZOLE 150 MG PO TABS: 150.0000 mg | ORAL_TABLET | Freq: Once | ORAL | 0 refills | Status: AC

## 2019-01-13 MED FILL — FLUCONAZOLE 150 MG TABLET: 150 | 1 days supply | Qty: 1 | Fill #0

## 2019-01-13 MED FILL — AMOXICILLIN 500 MG CAPSULE: 500 | 5 days supply | Qty: 15 | Fill #0

## 2019-01-14 LAB — URINE CULTURE

## 2019-03-08 ENCOUNTER — Ambulatory Visit: Payer: Self-pay | Attending: Internal Medicine

## 2019-03-08 ENCOUNTER — Other Ambulatory Visit: Payer: Self-pay

## 2019-03-08 DIAGNOSIS — Z20822 Contact with and (suspected) exposure to covid-19: Secondary | ICD-10-CM

## 2019-03-08 DIAGNOSIS — U071 COVID-19: Secondary | ICD-10-CM | POA: Insufficient documentation

## 2019-03-09 ENCOUNTER — Other Ambulatory Visit: Payer: Self-pay

## 2019-03-09 ENCOUNTER — Encounter: Payer: Self-pay | Admitting: *Deleted

## 2019-03-09 LAB — NOVEL CORONAVIRUS, NAA: SARS-CoV-2, NAA: DETECTED — AB

## 2019-03-10 ENCOUNTER — Other Ambulatory Visit: Payer: Self-pay | Admitting: Physician Assistant

## 2019-03-10 ENCOUNTER — Telehealth: Payer: Self-pay | Admitting: Physician Assistant

## 2019-03-10 ENCOUNTER — Other Ambulatory Visit: Payer: Self-pay

## 2019-03-10 ENCOUNTER — Ambulatory Visit (HOSPITAL_COMMUNITY): Payer: Self-pay | Attending: Pulmonary Disease

## 2019-03-10 ENCOUNTER — Encounter: Payer: Self-pay | Admitting: Physician Assistant

## 2019-03-10 DIAGNOSIS — U071 COVID-19: Secondary | ICD-10-CM

## 2019-03-10 NOTE — Telephone Encounter (Signed)
Called to discuss with [name] about Covid symptoms and the use of bamlanivimab or casirivimab/imdevimab, a monoclonal antibody infusion for those with mild to moderate Covid symptoms and at a high risk of hospitalization.     Pt is qualified for this infusion at the [facility] infusion center due to co-morbid conditions (BMI >35) and/or a member of an at-risk group, however wants to call us back. Symptoms tier reviewed as well as criteria for ending isolation.  Symptoms reviewed that would warrant ED/Hospital evaluation. Preventative practices reviewed. Patient verbalized understanding. Patient advised to call back if he decides that he does want to get infusion. Callback number to the infusion center given. Patient advised to go to Urgent care or ED with severe symptoms. Last date pt would be eligible for infusion is 03/13/19.    Patient Active Problem List   Diagnosis Date Noted  . Ventral hernia without obstruction or gangrene   . Diabetes mellitus screening 05/08/2016  . NVD (normal vaginal delivery) 07/19/2015  . Normal labor and delivery 07/19/2015  . Active labor at term 07/18/2015  . Periumbilical abdominal pain 07/31/2013  . Spleen enlarged 07/31/2013  . History of anemia 07/31/2013  . Dental caries 09/06/2012    Cline Crock PA-C

## 2019-03-10 NOTE — Progress Notes (Signed)
  I connected by phone with Katherine [name] on 03/10/2019 at 9:29 AM to discuss the potential use of an new treatment for mild to moderate COVID-19 viral infection in non-hospitalized patients.  This patient is a 44 y.o. female that meets the FDA criteria for Emergency Use Authorization of bamlanivimab or casirivimab\imdevimab.  Has a (+) direct SARS-CoV-2 viral test result  Has mild or moderate COVID-19   Is ? 43 years of age and weighs ? 40 kg  Is NOT hospitalized due to COVID-19  Is NOT requiring oxygen therapy or requiring an increase in baseline oxygen flow rate due to COVID-19  Is within 10 days of symptom onset  Has at least one of the high risk factor(s) for progression to severe COVID-19 and/or hospitalization as defined in EUA.  Specific high risk criteria : BMI >/= 35   I have spoken and communicated the following to the patient or parent/caregiver:  1. FDA has authorized the emergency use of bamlanivimab and casirivimab\imdevimab for the treatment of mild to moderate COVID-19 in adults and pediatric patients with positive results of direct SARS-CoV-2 viral testing who are 76 years of age and older weighing at least 40 kg, and who are at high risk for progressing to severe COVID-19 and/or hospitalization.  2. The significant known and potential risks and benefits of bamlanivimab and casirivimab\imdevimab, and the extent to which such potential risks and benefits are unknown.  3. Information on available alternative treatments and the risks and benefits of those alternatives, including clinical trials.  4. Patients treated with bamlanivimab and casirivimab\imdevimab should continue to self-isolate and use infection control measures (e.g., wear mask, isolate, social distance, avoid sharing personal items, clean and disinfect "high touch" surfaces, and frequent handwashing) according to CDC guidelines.   5. The patient or parent/caregiver has the option to accept or  refuse bamlanivimab or casirivimab\imdevimab .  After reviewing this information with the patient, The patient agreed to proceed with receiving the bamlanimivab infusion and will be provided a copy of the Fact sheet prior to receiving the infusion.   Pt scheduled today for 2:30pm. Sx onset 2/5. Directions given. Pt needs spanish interpreter.   Cline Crock PA-C 03/10/2019 9:29 AM

## 2019-04-05 ENCOUNTER — Other Ambulatory Visit: Payer: Self-pay

## 2019-04-05 ENCOUNTER — Ambulatory Visit: Payer: Self-pay | Attending: Family Medicine | Admitting: Physician Assistant

## 2019-04-05 DIAGNOSIS — R079 Chest pain, unspecified: Secondary | ICD-10-CM

## 2019-04-05 DIAGNOSIS — Z789 Other specified health status: Secondary | ICD-10-CM

## 2019-04-05 DIAGNOSIS — H6690 Otitis media, unspecified, unspecified ear: Secondary | ICD-10-CM

## 2019-04-05 DIAGNOSIS — M545 Low back pain, unspecified: Secondary | ICD-10-CM

## 2019-04-05 MED ORDER — AMOXICILLIN 500 MG PO CAPS: 500.0000 mg | ORAL_CAPSULE | Freq: Three times a day (TID) | ORAL | 0 refills | Status: DC

## 2019-04-05 MED ORDER — METHOCARBAMOL 500 MG PO TABS: 500.0000 mg | ORAL_TABLET | Freq: Three times a day (TID) | ORAL | 0 refills | Status: DC | PRN

## 2019-04-05 MED ORDER — FLUCONAZOLE 150 MG PO TABS: 150.0000 mg | ORAL_TABLET | Freq: Once | ORAL | 0 refills | Status: AC

## 2019-04-05 MED FILL — FLUCONAZOLE 150 MG TABLET: 150 | 1 days supply | Qty: 1 | Fill #0

## 2019-04-05 MED FILL — METHOCARBAMOL 500 MG TABS: 500 | 20 days supply | Qty: 60 | Fill #0

## 2019-04-05 MED FILL — ?AMOXICILLIN 500 MG CAPS: 500 | 10 days supply | Qty: 30 | Fill #0

## 2019-04-05 NOTE — Progress Notes (Signed)
Virtual Visit via Telephone Note  I connected with Katherine [name] on 04/05/19 at  9:50 AM EST by telephone and verified that I am speaking with the correct person using two identifiers.   I discussed the limitations, risks, security and privacy concerns of performing an evaluation and management service by telephone and the availability of in person appointments. I also discussed with the patient that there may be a patient responsible charge related to this service. The patient expressed understanding and agreed to proceed.  PATIENT visit by telephone virtually in the context of Covid-19 pandemic. Patient location: My Location:  [facility] office Persons on the call: [name](interpreter), me and the patient.     History of Present Illness:  Patient had Covid infection 03/08/2019.  Feeling much better but a little bit weak at times.    No further cough or fever.    Pain in lower back for about 2 weeks and having ear inflammation on the L>R.  No urinary s/sx.  Ear pain for 3-4 days.    Also c/o residual CP since having covid.  Sometimes has pain and pressure in her chest and can't catch her breath.  She is not having pain now.  No radiating pain.  Occurs spontaneously.  Not exacerbated/precipitated/relieved by anything.  Lasts a few seconds    Observations/Objective:  NAD.  A&Ox3   Assessment and Plan: 1. Low back pain, unspecified back pain laterality, unspecified chronicity, unspecified whether sciatica present advil or tylenol - methocarbamol (ROBAXIN) 500 MG tablet; Take 1 tablet (500 mg total) by mouth every 8 (eight) hours as needed for muscle spasms.  Dispense: 60 tablet; Refill: 0  2. Ear infection - amoxicillin (AMOXIL) 500 MG capsule; Take 1 capsule (500 mg total) by mouth 3 (three) times daily.  Dispense: 30 capsule; Refill: 0  3. Language barrier  stratus interpreters used and additional time performing visit was required.   4. Chest pain, unspecified type To ED if  recurs.  Patient agrees.  Call 911 if severe    Follow Up Instructions: prn   I discussed the assessment and treatment plan with the patient. The patient was provided an opportunity to ask questions and all were answered. The patient agreed with the plan and demonstrated an understanding of the instructions.   The patient was advised to call back or seek an in-person evaluation if the symptoms worsen or if the condition fails to improve as anticipated.  I provided 12 minutes of non-face-to-face time during this encounter.   Georgian Co, PA-C  Patient ID: Katherine [name], female   DOB: 1975/10/10, 44 y.o.   MRN: [id number]

## 2019-05-03 ENCOUNTER — Ambulatory Visit: Payer: Self-pay | Attending: Critical Care Medicine | Admitting: Critical Care Medicine

## 2019-05-03 ENCOUNTER — Other Ambulatory Visit: Payer: Self-pay

## 2019-05-03 ENCOUNTER — Encounter: Payer: Self-pay | Admitting: Critical Care Medicine

## 2019-05-03 DIAGNOSIS — M545 Low back pain, unspecified: Secondary | ICD-10-CM | POA: Insufficient documentation

## 2019-05-03 DIAGNOSIS — R102 Pelvic and perineal pain: Secondary | ICD-10-CM | POA: Insufficient documentation

## 2019-05-03 DIAGNOSIS — G8929 Other chronic pain: Secondary | ICD-10-CM

## 2019-05-03 DIAGNOSIS — Z8616 Personal history of COVID-19: Secondary | ICD-10-CM

## 2019-05-03 DIAGNOSIS — B029 Zoster without complications: Secondary | ICD-10-CM | POA: Insufficient documentation

## 2019-05-03 MED ORDER — VALACYCLOVIR HCL 1 G PO TABS: 1000.0000 mg | ORAL_TABLET | Freq: Three times a day (TID) | ORAL | 1 refills | Status: DC

## 2019-05-03 MED ORDER — NAPROXEN 500 MG PO TABS: 500.0000 mg | ORAL_TABLET | Freq: Two times a day (BID) | ORAL | 0 refills | Status: DC

## 2019-05-03 NOTE — Assessment & Plan Note (Signed)
History of Covid infection now completely resolved  I did recommend the patient obtain a Covid vaccine at this time she said she would follow through on this and I sent her information through my chart on how to access the vaccine

## 2019-05-03 NOTE — Assessment & Plan Note (Signed)
Chronic low back pain without radiculopathy  I have prescribed Naprosyn and recommended exercises for this patient

## 2019-05-03 NOTE — Assessment & Plan Note (Signed)
Patient requesting a gynecology referral for chronic pelvic pain she is gravida 6 para 6 we will proceed with referral

## 2019-05-03 NOTE — Patient Instructions (Signed)
A gynecology referral will be made  COVID-19 Vaccine Information can be found at: [email or web address] For questions related to vaccine distribution or appointments, please email [email or web address] or call [phone].

## 2019-05-03 NOTE — Progress Notes (Signed)
Subjective:    Patient ID: Katherine [name], female    DOB: July 10, 1975, 44 y.o.   MRN: [id number] Virtual Visit via Telephone Note  I connected with Katherine [name] on 05/03/19 at 11:00 AM EDT by telephone and verified that I am speaking with the correct person using two identifiers.   Consent:  I discussed the limitations, risks, security and privacy concerns of performing an evaluation and management service by telephone and the availability of in person appointments. I also discussed with the patient that there may be a patient responsible charge related to this service. The patient expressed understanding and agreed to proceed.  Location of patient: Patient is at home  Location of provider: I am in my office  Persons participating in the televisit with the patient.    [facility] [place] interpreters assisted with this with Spanish interpretation interpreter [phone]   History of Present Illnes  This is a 44 year old Latina female who had Covid infection in February 2021.  She was offered monoclonal antibody infusion but she declined this.  She actually recovered on her own.  This is a telephone visit follow-up for the patient's condition.  Overall she is markedly improved.  She has minimal muscle back pain.  She had this chronically before she had the Covid.  She states Robaxin and Naprosyn are helpful.  She has no other complaints at this time.  Her primary care provider is Dr. Chapman Fitch.  This patient is requesting a gynecology referral for chronic pelvic pain at this time.  She is gravida 6 para 6    Past Medical History:  Diagnosis Date  . Abdominal pain   . Anemia   . Depression    postpartum depression  . Morbid obesity ([place])   . Splenomegaly      Family History  Problem Relation Age of Onset  . Diabetes Father   . Kidney disease Brother      Social History   Socioeconomic History  . Marital status: Single    Spouse name: Not on file  . Number of  children: 5  . Years of education: Not on file  . Highest education level: Not on file  Occupational History  . Not on file  Tobacco Use  . Smoking status: Never Smoker  . Smokeless tobacco: Never Used  Substance and Sexual Activity  . Alcohol use: No  . Drug use: No  . Sexual activity: Yes    Birth control/protection: Surgical  Other Topics Concern  . Not on file  Social History Narrative  . Not on file   Social Determinants of Health   Financial Resource Strain:   . Difficulty of Paying Living Expenses:   Food Insecurity:   . Worried About Charity fundraiser in the Last Year:   . Arboriculturist in the Last Year:   Transportation Needs:   . Film/video editor (Medical):   Marland Kitchen Lack of Transportation (Non-Medical):   Physical Activity:   . Days of Exercise per Week:   . Minutes of Exercise per Session:   Stress:   . Feeling of Stress :   Social Connections:   . Frequency of Communication with Friends and Family:   . Frequency of Social Gatherings with Friends and Family:   . Attends Religious Services:   . Active Member of Clubs or Organizations:   . Attends Archivist Meetings:   Marland Kitchen Marital Status:   Intimate Partner Violence:   . Fear of Current or  Ex-Partner:   . Emotionally Abused:   Marland Kitchen Physically Abused:   . Sexually Abused:      No Known Allergies   Outpatient Medications Prior to Visit  Medication Sig Dispense Refill  . acetaminophen (TYLENOL) 500 MG tablet Take 500-1,000 mg by mouth every 6 (six) hours as needed for mild pain.     Marland Kitchen docusate sodium (COLACE) 100 MG capsule Take 1 capsule (100 mg total) by mouth 2 (two) times daily as needed. 60 capsule 2  . methocarbamol (ROBAXIN) 500 MG tablet Take 1 tablet (500 mg total) by mouth every 8 (eight) hours as needed for muscle spasms. 60 tablet 0  . valACYclovir (VALTREX) 1000 MG tablet Take 1 tablet (1,000 mg total) by mouth 3 (three) times daily. 21 tablet 0  . amoxicillin (AMOXIL) 500 MG  capsule Take 1 capsule (500 mg total) by mouth 3 (three) times daily. (Patient not taking: Reported on 05/03/2019) 30 capsule 0  . naproxen (NAPROSYN) 500 MG tablet Take 1 tablet (500 mg total) by mouth 2 (two) times daily with a meal. Prn pain (Patient not taking: Reported on 05/03/2019) 60 tablet 0   No facility-administered medications prior to visit.      Review of Systems  Constitutional: Negative.   HENT: Negative.   Respiratory: Negative.   Cardiovascular: Negative.   Gastrointestinal: Negative.   Genitourinary: Positive for pelvic pain.  Neurological: Negative.   Hematological: Negative.   Psychiatric/Behavioral: Negative.        Objective:   Physical Exam No exam this is a telephone visit       Assessment & Plan:  I personally reviewed all images and lab data in the [facility] system as well as any outside material available during this office visit and agree with the  radiology impressions.   History of COVID-19 History of Covid infection now completely resolved  I did recommend the patient obtain a Covid vaccine at this time she said she would follow through on this and I sent her information through my chart on how to access the vaccine  Pelvic pain Patient requesting a gynecology referral for chronic pelvic pain she is gravida 6 para 6 we will proceed with referral  Chronic bilateral low back pain without sciatica Chronic low back pain without radiculopathy  I have prescribed Naprosyn and recommended exercises for this patient   Diagnoses and all orders for this visit:  Pelvic pain -     Ambulatory referral to Gynecology  Herpes zoster without complication -     naproxen (NAPROSYN) 500 MG tablet; Take 1 tablet (500 mg total) by mouth 2 (two) times daily with a meal. Prn pain -     valACYclovir (VALTREX) 1000 MG tablet; Take 1 tablet (1,000 mg total) by mouth 3 (three) times daily. At the first sign of herpes outbreak  History of COVID-19  Chronic bilateral  low back pain without sciatica  I did refill the patient's Valtrex because of her history of herpes zoster recurrence   Follow Up Instructions: The patient knows a return visit will be scheduled in a gynecology referral is made   I discussed the assessment and treatment plan with the patient. The patient was provided an opportunity to ask questions and all were answered. The patient agreed with the plan and demonstrated an understanding of the instructions.   The patient was advised to call back or seek an in-person evaluation if the symptoms worsen or if the condition fails to improve as anticipated.  I  provided 30 minutes of non-face-to-face time during this encounter  including  median intraservice time , review of notes, labs, imaging, medications  and explaining diagnosis and management to the patient .    Shan Levans, MD

## 2019-05-03 NOTE — Progress Notes (Signed)
COVID f/u

## 2019-06-22 ENCOUNTER — Encounter: Payer: Self-pay | Admitting: Obstetrics & Gynecology

## 2019-07-10 ENCOUNTER — Other Ambulatory Visit (HOSPITAL_COMMUNITY)
Admission: RE | Admit: 2019-07-10 | Discharge: 2019-07-10 | Disposition: A | Discharge status: Home / Self Care | Payer: Self-pay | Source: Ambulatory Visit | Attending: Obstetrics and Gynecology | Admitting: Obstetrics and Gynecology

## 2019-07-10 ENCOUNTER — Encounter: Payer: Self-pay | Admitting: Obstetrics and Gynecology

## 2019-07-10 ENCOUNTER — Ambulatory Visit (INDEPENDENT_AMBULATORY_CARE_PROVIDER_SITE_OTHER): Payer: Self-pay | Admitting: Obstetrics and Gynecology

## 2019-07-10 ENCOUNTER — Other Ambulatory Visit: Payer: Self-pay

## 2019-07-10 VITALS — BP 114/64 | HR 57 | Wt 228.0 lb

## 2019-07-10 DIAGNOSIS — Z124 Encounter for screening for malignant neoplasm of cervix: Secondary | ICD-10-CM | POA: Insufficient documentation

## 2019-07-10 DIAGNOSIS — Z1239 Encounter for other screening for malignant neoplasm of breast: Secondary | ICD-10-CM

## 2019-07-10 DIAGNOSIS — R102 Pelvic and perineal pain unspecified side: Secondary | ICD-10-CM

## 2019-07-10 DIAGNOSIS — Z113 Encounter for screening for infections with a predominantly sexual mode of transmission: Secondary | ICD-10-CM | POA: Insufficient documentation

## 2019-07-10 DIAGNOSIS — Z01419 Encounter for gynecological examination (general) (routine) without abnormal findings: Secondary | ICD-10-CM
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
## 2019-07-10 NOTE — Patient Instructions (Signed)
[facility] [address] [place] [address]  [facility] [place]  [address] [place] [address]  Phone: [phone]  

## 2019-07-13 LAB — CYTOLOGY - PAP
Chlamydia: NEGATIVE
Comment: NEGATIVE
Comment: NEGATIVE
Comment: NORMAL
Diagnosis: NEGATIVE
High risk HPV: NEGATIVE
Neisseria Gonorrhea: NEGATIVE

## 2019-07-25 ENCOUNTER — Ambulatory Visit: Payer: Self-pay | Admitting: Obstetrics and Gynecology

## 2019-07-25 ENCOUNTER — Ambulatory Visit: Payer: Self-pay

## 2019-07-26 ENCOUNTER — Other Ambulatory Visit: Payer: Self-pay | Admitting: Family Medicine

## 2019-07-26 DIAGNOSIS — Z1231 Encounter for screening mammogram for malignant neoplasm of breast: Secondary | ICD-10-CM

## 2019-07-28 ENCOUNTER — Other Ambulatory Visit: Payer: Self-pay

## 2019-07-28 ENCOUNTER — Other Ambulatory Visit: Payer: Self-pay | Admitting: *Deleted

## 2019-07-28 DIAGNOSIS — Z1231 Encounter for screening mammogram for malignant neoplasm of breast: Secondary | ICD-10-CM

## 2019-08-15 ENCOUNTER — Ambulatory Visit (INDEPENDENT_AMBULATORY_CARE_PROVIDER_SITE_OTHER): Payer: Self-pay | Admitting: Obstetrics and Gynecology

## 2019-08-15 ENCOUNTER — Other Ambulatory Visit: Payer: Self-pay

## 2019-08-15 ENCOUNTER — Ambulatory Visit (INDEPENDENT_AMBULATORY_CARE_PROVIDER_SITE_OTHER): Payer: Self-pay

## 2019-08-15 ENCOUNTER — Encounter: Payer: Self-pay | Admitting: Obstetrics and Gynecology

## 2019-08-15 VITALS — BP 126/78 | HR 54 | Wt 228.0 lb

## 2019-08-15 DIAGNOSIS — N941 Unspecified dyspareunia: Secondary | ICD-10-CM

## 2019-08-15 DIAGNOSIS — R102 Pelvic and perineal pain: Secondary | ICD-10-CM

## 2019-08-15 DIAGNOSIS — N946 Dysmenorrhea, unspecified: Secondary | ICD-10-CM

## 2019-08-15 MED ORDER — NORETHINDRONE ACETATE 5 MG PO TABS: 5.0000 mg | ORAL_TABLET | Freq: Every day | ORAL | 11 refills | Status: DC

## 2019-08-15 NOTE — Progress Notes (Signed)
Gynecology Ultrasound Follow Up  Chief Complaint:  Chief Complaint  Patient presents with   Follow-up    GYN ultrasound     History of Present Illness: Patient is a 44 y.o. female who presents today for ultrasound evaluation of dysmenorrhea and dyspareunia  .  Ultrasound demonstrates the following findgins Adnexa: no masses seen  Uterus: Non-enlarged with endometrial stripe  Imaging normally without focal abnormalities Additional: no free fluid  Review of Systems: Review of Systems  Constitutional: Negative.   Gastrointestinal: Negative.   Genitourinary:       Dysmenorrhea and dypsareunia    Past Medical History:  Past Medical History:  Diagnosis Date   Abdominal pain    Anemia    Depression    postpartum depression   Morbid obesity (HCC)    Splenomegaly     Past Surgical History:  Past Surgical History:  Procedure Laterality Date   nexplanon     NO PAST SURGERIES     ROBOTIC ASSISTED LAPAROSCOPIC VENTRAL/INCISIONAL HERNIA REPAIR N/A 01/04/2018   Procedure: ROBOTIC ASSISTED LAPAROSCOPIC VENTRAL/INCISIONAL HERNIA REPAIR;  Surgeon: Leafy Ro, MD;  Location: [facility] OR[facility];  Service: General;  Laterality: N/A;   ROBOTIC ASSISTED SALPINGO OOPHERECTOMY Bilateral 01/04/2018   Procedure: ROBOTIC ASSISTED SALPINGECTOMY;  Surgeon: Vena Austria, MD;  Location: [facility];  Service: Gynecology;  Laterality: Bilateral;   TUBAL LIGATION     UPPER GI ENDOSCOPY  2015    Gynecologic History:  Patient's last menstrual period was 08/07/2019. Contraception: tubal ligation Last Pap: 07/10/2019 Results were: .no abnormalities  Family History:  Family History  Problem Relation Age of Onset   Diabetes Father    Kidney disease Brother     Social History:  Social History   Socioeconomic History   Marital status: Single    Spouse name: Not on file   Number of children: 5   Years of education: Not on file   Highest education level: Not on file    Occupational History   Not on file  Tobacco Use   Smoking status: Never Smoker   Smokeless tobacco: Never Used  Building services engineer Use: Never used  Substance and Sexual Activity   Alcohol use: No   Drug use: No   Sexual activity: Yes    Birth control/protection: Surgical  Other Topics Concern   Not on file  Social History Narrative   Not on file   Social Determinants of Health   Financial Resource Strain:    Difficulty of Paying Living Expenses:   Food Insecurity:    Worried About Programme researcher, broadcasting/film/video in the Last Year:    Barista in the Last Year:   Transportation Needs:    Freight forwarder (Medical):    Lack of Transportation (Non-Medical):   Physical Activity:    Days of Exercise per Week:    Minutes of Exercise per Session:   Stress:    Feeling of Stress :   Social Connections:    Frequency of Communication with Friends and Family:    Frequency of Social Gatherings with Friends and Family:    Attends Religious Services:    Active Member of Clubs or Organizations:    Attends Banker Meetings:    Marital Status:   Intimate Partner Violence:    Fear of Current or Ex-Partner:    Emotionally Abused:    Physically Abused:    Sexually Abused:     Allergies:  No Known Allergies  Medications: Prior to Admission medications   Medication Sig Start Date End Date Taking? Authorizing Provider  acetaminophen (TYLENOL) 500 MG tablet Take 500-1,000 mg by mouth every 6 (six) hours as needed for mild pain.    Yes [provider]  docusate sodium (COLACE) 100 MG capsule Take 1 capsule (100 mg total) by mouth 2 (two) times daily as needed. 12/07/18  Yes Adam Phenix, MD  methocarbamol (ROBAXIN) 500 MG tablet Take 1 tablet (500 mg total) by mouth every 8 (eight) hours as needed for muscle spasms. 04/05/19  Yes Georgian Co M, PA-C  naproxen (NAPROSYN) 500 MG tablet Take 1 tablet (500 mg total) by mouth 2 (two)  times daily with a meal. Prn pain 05/03/19  Yes Storm Frisk, MD  valACYclovir (VALTREX) 1000 MG tablet Take 1 tablet (1,000 mg total) by mouth 3 (three) times daily. At the first sign of herpes outbreak 05/03/19  Yes Storm Frisk, MD    Physical Exam Vitals: Blood pressure 126/78, pulse (!) 54, weight 228 lb (103.4 kg), last menstrual period 08/07/2019.  General: NAD HEENT: normocephalic, anicteric Pulmonary: No increased work of breathing Extremities: no edema, erythema, or tenderness Neurologic: Grossly intact, normal gait Psychiatric: mood appropriate, affect full  US Transvaginal Non-OB  Result Date: 08/15/2019 Patient Name: Katherine [name] DOB: [birth year] MRN: [id number] ULTRASOUND REPORT Location: [facility] Date of Service: 08/15/2019 Indications:Pelvic Pain Findings: The uterus is anteverted and measures 8.3 x 5.4 x 4.1 cm. Echo texture is homogenous without evidence of focal masses. The Endometrium measures 3.0 mm. Right Ovary measures 2.8 x 2.8 x 2.6 cm. It is normal in appearance. Left Ovary measures 3.0 x 2.2 x 1.9 cm. It is normal in appearance. Survey of the adnexa demonstrates no adnexal masses. There is no free fluid in the cul de sac. Impression: 1. Normal pelvic ultrasound. Recommendations: 1.Clinical correlation with the patient's History and Physical Exam. Deanna Artis, RT Images reviewed.  Normal GYN study without visualized pathology.  Vena Austria, MD, Evern Core [facility], [facility] 08/15/2019, 11:47 AM    Assessment: 44 y.o. [id number] ultrasound follow up  Plan: Problem List Items Addressed This Visit    None    Visit Diagnoses    Dysmenorrhea    -  Primary   Dyspareunia, female          1) Dysmenorrhea and dyspareunia - in setting of normal ultrasound today endometriosis high in the differential. - Trial of norethindrone   2) A total of 15 minutes were spent in face-to-face contact with the patient during this  encounter with over half of that time devoted to counseling and coordination of care.  3) Return in about 8 weeks (around 10/10/2019) for medication follow up.    Vena Austria, MD, Evern Core [facility], [facility] 08/15/2019, 11:54 AM

## 2019-08-16 MED FILL — NORETHINDRONE 5 MG TABLET: 5 | 30 days supply | Qty: 30 | Fill #0

## 2019-08-22 ENCOUNTER — Inpatient Hospital Stay: Admission: RE | Admit: 2019-08-22 | Payer: Self-pay | Source: Ambulatory Visit

## 2019-08-22 ENCOUNTER — Ambulatory Visit: Payer: Self-pay

## 2019-10-06 ENCOUNTER — Ambulatory Visit: Payer: Self-pay

## 2019-10-12 ENCOUNTER — Ambulatory Visit: Payer: Self-pay | Admitting: Obstetrics and Gynecology

## 2019-10-13 ENCOUNTER — Other Ambulatory Visit: Payer: Self-pay

## 2019-10-13 ENCOUNTER — Ambulatory Visit: Payer: Self-pay | Attending: Family Medicine

## 2019-11-01 ENCOUNTER — Ambulatory Visit: Payer: Self-pay | Admitting: Family Medicine

## 2019-11-02 ENCOUNTER — Ambulatory Visit: Payer: Self-pay

## 2019-11-03 ENCOUNTER — Ambulatory Visit: Payer: Self-pay

## 2019-11-08 ENCOUNTER — Ambulatory Visit: Payer: Self-pay | Admitting: Family Medicine

## 2019-11-09 ENCOUNTER — Other Ambulatory Visit: Payer: Self-pay | Admitting: Family Medicine

## 2019-11-09 ENCOUNTER — Ambulatory Visit: Payer: Self-pay

## 2019-11-09 ENCOUNTER — Other Ambulatory Visit: Payer: Self-pay

## 2019-11-09 ENCOUNTER — Ambulatory Visit: Payer: Self-pay | Attending: Family Medicine | Admitting: Family Medicine

## 2019-11-09 ENCOUNTER — Encounter: Payer: Self-pay | Admitting: Family Medicine

## 2019-11-09 VITALS — BP 118/76 | HR 76 | Ht 62.0 in | Wt 214.2 lb

## 2019-11-09 DIAGNOSIS — H538 Other visual disturbances: Secondary | ICD-10-CM

## 2019-11-09 DIAGNOSIS — Z789 Other specified health status: Secondary | ICD-10-CM

## 2019-11-09 DIAGNOSIS — Z603 Acculturation difficulty: Secondary | ICD-10-CM

## 2019-11-09 DIAGNOSIS — J069 Acute upper respiratory infection, unspecified: Secondary | ICD-10-CM

## 2019-11-09 DIAGNOSIS — H6690 Otitis media, unspecified, unspecified ear: Secondary | ICD-10-CM

## 2019-11-09 DIAGNOSIS — R252 Cramp and spasm: Secondary | ICD-10-CM

## 2019-11-09 DIAGNOSIS — Z758 Other problems related to medical facilities and other health care: Secondary | ICD-10-CM

## 2019-11-09 LAB — POCT GLYCOSYLATED HEMOGLOBIN (HGB A1C): Hemoglobin A1C: 5.4 % (ref 4.0–5.6)

## 2019-11-09 LAB — GLUCOSE, POCT (MANUAL RESULT ENTRY): POC Glucose: 97 mg/dL (ref 70–99)

## 2019-11-09 MED ORDER — METHOCARBAMOL 500 MG PO TABS: 500.0000 mg | ORAL_TABLET | Freq: Three times a day (TID) | ORAL | 2 refills | Status: DC | PRN

## 2019-11-09 MED ORDER — FLUTICASONE PROPIONATE 50 MCG/ACT NA SUSP: 2.0000 | Freq: Every day | NASAL | 3 refills | Status: DC

## 2019-11-09 MED ORDER — CETIRIZINE HCL 10 MG PO TABS: 10.0000 mg | ORAL_TABLET | Freq: Every day | ORAL | 3 refills | Status: DC

## 2019-11-09 MED ORDER — AMOXICILLIN-POT CLAVULANATE 500-125 MG PO TABS: 1.0000 | ORAL_TABLET | Freq: Two times a day (BID) | ORAL | 0 refills | Status: DC

## 2019-11-09 MED FILL — ?CETIRIZINE HCL 10 MG TABLE: 10 | 30 days supply | Qty: 30 | Fill #0

## 2019-11-09 MED FILL — ?METHOCARBAMOL 500 MG TABLE: 500 | 20 days supply | Qty: 60 | Fill #0

## 2019-11-09 MED FILL — FLUTICASONE PROP 50 MCG SPR: 50 | 30 days supply | Qty: 16 | Fill #0

## 2019-11-09 MED FILL — AMOX-CLAV 500-125 MG TABLET: 500-125 | 7 days supply | Qty: 14 | Fill #0

## 2019-11-09 NOTE — Progress Notes (Signed)
Established Patient Office Visit  Subjective:  Patient ID: Katherine [name], female    DOB: [birth year]  Age: 44 y.o. MRN: [id number]  CC:  Chief Complaint  Patient presents with  . Ear Pain   [employer] video interpretation system used at today's visit  HPI Katherine [name], 44 yo female who presents due to the complaint of 2 weeks of pain in the left ear which radiates to the neck, nasal congestion with postnasal drainage, frontal and right temporal headaches and pain in the left calf/sensation of muscle cramp for the past two weeks. She also feels as if she has had blurred vision at times. Her eyes have felt tired and pressure sensation behind the eyes.   Past Medical History:  Diagnosis Date  . Abdominal pain   . Anemia   . Depression    postpartum depression  . Morbid obesity (HCC)   . Splenomegaly     Past Surgical History:  Procedure Laterality Date  . nexplanon    . NO PAST SURGERIES    . ROBOTIC ASSISTED LAPAROSCOPIC VENTRAL/INCISIONAL HERNIA REPAIR N/A 01/04/2018   Procedure: ROBOTIC ASSISTED LAPAROSCOPIC VENTRAL/INCISIONAL HERNIA REPAIR;  Surgeon: Leafy Ro, MD;  Location: [facility];  Service: General;  Laterality: N/A;  . ROBOTIC ASSISTED SALPINGO OOPHERECTOMY Bilateral 01/04/2018   Procedure: ROBOTIC ASSISTED SALPINGECTOMY;  Surgeon: Vena Austria, MD;  Location: [facility];  Service: Gynecology;  Laterality: Bilateral;  . TUBAL LIGATION    . UPPER GI ENDOSCOPY  2015    Family History  Problem Relation Age of Onset  . Diabetes Father   . Kidney disease Brother     Social History   Socioeconomic History  . Marital status: Single    Spouse name: Not on file  . Number of children: 5  . Years of education: Not on file  . Highest education level: Not on file  Occupational History  . Not on file  Tobacco Use  . Smoking status: Never Smoker  . Smokeless tobacco: Never Used  Vaping Use  . Vaping Use: Never used  Substance and Sexual  Activity  . Alcohol use: No  . Drug use: No  . Sexual activity: Yes    Birth control/protection: Surgical  Other Topics Concern  . Not on file  Social History Narrative  . Not on file   Social Determinants of Health   Financial Resource Strain:   . Difficulty of Paying Living Expenses: Not on file  Food Insecurity:   . Worried About Programme researcher, broadcasting/film/video in the Last Year: Not on file  . Ran Out of Food in the Last Year: Not on file  Transportation Needs:   . Lack of Transportation (Medical): Not on file  . Lack of Transportation (Non-Medical): Not on file  Physical Activity:   . Days of Exercise per Week: Not on file  . Minutes of Exercise per Session: Not on file  Stress:   . Feeling of Stress : Not on file  Social Connections:   . Frequency of Communication with Friends and Family: Not on file  . Frequency of Social Gatherings with Friends and Family: Not on file  . Attends Religious Services: Not on file  . Active Member of Clubs or Organizations: Not on file  . Attends Banker Meetings: Not on file  . Marital Status: Not on file  Intimate Partner Violence:   . Fear of Current or Ex-Partner: Not on file  . Emotionally Abused: Not on file  . Physically  Abused: Not on file  . Sexually Abused: Not on file    Outpatient Medications Prior to Visit  Medication Sig Dispense Refill  . acetaminophen (TYLENOL) 500 MG tablet Take 500-1,000 mg by mouth every 6 (six) hours as needed for mild pain.     Marland Kitchen. docusate sodium (COLACE) 100 MG capsule Take 1 capsule (100 mg total) by mouth 2 (two) times daily as needed. 60 capsule 2  . methocarbamol (ROBAXIN) 500 MG tablet Take 1 tablet (500 mg total) by mouth every 8 (eight) hours as needed for muscle spasms. 60 tablet 0  . naproxen (NAPROSYN) 500 MG tablet Take 1 tablet (500 mg total) by mouth 2 (two) times daily with a meal. Prn pain 60 tablet 0  . norethindrone (AYGESTIN) 5 MG tablet Take 1 tablet (5 mg total) by mouth daily.  30 tablet 11  . valACYclovir (VALTREX) 1000 MG tablet Take 1 tablet (1,000 mg total) by mouth 3 (three) times daily. At the first sign of herpes outbreak 21 tablet 1   No facility-administered medications prior to visit.    No Known Allergies  ROS Review of Systems  Constitutional: Positive for chills and fatigue. Negative for fever.  HENT: Positive for congestion, ear pain, postnasal drip, rhinorrhea and sinus pressure. Negative for sore throat and trouble swallowing.   Eyes: Positive for visual disturbance. Negative for photophobia.  Respiratory: Positive for shortness of breath. Negative for cough.   Cardiovascular: Negative for chest pain and palpitations.  Gastrointestinal: Negative for abdominal pain, constipation, diarrhea and nausea.  Endocrine: Negative for polydipsia, polyphagia and polyuria.  Genitourinary: Negative for dysuria and frequency.  Musculoskeletal: Positive for arthralgias and myalgias.  Neurological: Positive for headaches. Negative for dizziness.  Hematological: Negative for adenopathy. Does not bruise/bleed easily.      Objective:    Physical Exam Vitals and nursing note reviewed.  Constitutional:      Appearance: Normal appearance.  HENT:     Right Ear: Ear canal and external ear normal.     Left Ear: Ear canal and external ear normal.     Ears:     Comments: Right TM dull; left TM pink and slightly retracted without visible landmarks    Nose: Congestion and rhinorrhea present.     Comments: Thick clear to white mucoid nasal discharge and edematous nasal turbinates    Mouth/Throat:     Pharynx: Posterior oropharyngeal erythema present. No oropharyngeal exudate.     Comments: Posterior pharynx edema/erythema and cobblestoning Neck:     Comments: Tender left upper cervical chain tenderness Cardiovascular:     Rate and Rhythm: Normal rate and regular rhythm.  Pulmonary:     Effort: Pulmonary effort is normal.     Breath sounds: Normal breath  sounds.  Abdominal:     Tenderness: There is no abdominal tenderness. There is no right CVA tenderness, left CVA tenderness, guarding or rebound.  Musculoskeletal:        General: Tenderness (left posterior calf) present.     Cervical back: Normal range of motion and neck supple. Tenderness present. No rigidity.     Right lower leg: No edema.     Left lower leg: No edema.  Lymphadenopathy:     Cervical: Cervical adenopathy present.  Skin:    General: Skin is warm and dry.     Comments: "broken" spider veins- left posterior calf;  no distended varicose veins; calf tenderness to palpation but no masses and no edema present  Neurological:  General: No focal deficit present.     Mental Status: She is alert and oriented to person, place, and time.  Psychiatric:        Mood and Affect: Mood normal.        Behavior: Behavior normal.     BP 118/76 (BP Location: Left Arm, Patient Position: Sitting)   Pulse 76   Ht 5\' 2"  (1.575 m)   Wt 214 lb 3.2 oz (97.2 kg)   SpO2 99%   BMI 39.18 kg/m  Wt Readings from Last 3 Encounters:  11/09/19 214 lb 3.2 oz (97.2 kg)  08/15/19 228 lb (103.4 kg)  07/10/19 228 lb (103.4 kg)     Health Maintenance Due  Topic Date Due  . Hepatitis C Screening  Never done  . COVID-19 Vaccine (1) Never done  . INFLUENZA VACCINE  08/27/2019    There are no preventive care reminders to display for this patient.  Lab Results  Component Value Date   TSH 1.210 01/11/2019   Lab Results  Component Value Date   WBC 11.1 (H) 01/11/2019   HGB 12.8 01/11/2019   HCT 38.4 01/11/2019   MCV 83 01/11/2019   PLT 335 01/11/2019   Lab Results  Component Value Date   NA 140 01/11/2019   K 4.3 01/11/2019   CO2 23 01/11/2019   GLUCOSE 96 01/11/2019   BUN 11 01/11/2019   CREATININE 0.47 (L) 01/11/2019   BILITOT 0.4 01/11/2019   ALKPHOS 110 01/11/2019   AST 16 01/11/2019   ALT 15 01/11/2019   PROT 7.6 01/11/2019   ALBUMIN 4.3 01/11/2019   CALCIUM 8.9  01/11/2019   ANIONGAP 9 02/24/2018   No results found for: CHOL No results found for: HDL No results found for: LDLCALC No results found for: TRIG No results found for: [facility] Lab Results  Component Value Date   HGBA1C 5.6 11/02/2018      Assessment & Plan:  1. Subacute otitis media, unspecified otitis media type RX for augmentin and cetirizine and flonase to treat otitis media and ear pressure/nasal congestion.  - amoxicillin-clavulanate (AUGMENTIN) 500-125 MG tablet; Take 1 tablet (500 mg total) by mouth 2 (two) times daily for 7 days. Take after eating  Dispense: 14 tablet; Refill: 0 - cetirizine (ZYRTEC) 10 MG tablet; Take 1 tablet (10 mg total) by mouth at bedtime. As needed for nasal congestion  Dispense: 30 tablet; Refill: 3 - fluticasone (FLONASE) 50 MCG/ACT nasal spray; Place 2 sprays into both nostrils daily. As needed  Dispense: 16 g; Refill: 3  2. Viral upper respiratory tract infection New RX for cetirizine 10 mg at bedtime and refill of flonase for congestion and postnasal drainage/facial pressure.  - cetirizine (ZYRTEC) 10 MG tablet; Take 1 tablet (10 mg total) by mouth at bedtime. As needed for nasal congestion  Dispense: 30 tablet; Refill: 3  3. Cramp in lower leg Will check BMP for electrolyte abnormality. Patient also has varicose veins but this did not appear distended. Will also send in Rx for Robaxin which she has taken in the past for low back pain. If she does have phlebitis then the Augmentin for ear pain should also help with her leg pain. No suspicion for DVT based on exam.  - Basic Metabolic Panel - methocarbamol (ROBAXIN) 500 MG tablet; Take 1 tablet (500 mg total) by mouth every 8 (eight) hours as needed for muscle spasms.  Dispense: 60 tablet; Refill: 2  4. Blurred vision Hgb A1c checked due to patient's complaint  of blurred vision and glucose and Hgb A1c normal. Optometry referral placed. She also complains of her eyes feeling tired and pressure  sensation so this may be related to her current nasal congestion as well.  - HgB A1c - Glucose (CBG) - Ambulatory referral to Optometry  5. Language barrier Video interpretation system used to help with language barrier   Follow-up: Return in about 2 weeks (around 11/23/2019) for ear pain/blurred vision if not better in 1-2 weeks.   Cain Saupe, MD

## 2019-11-09 NOTE — Progress Notes (Signed)
Ear/eye pain (left side) 2 weeks

## 2019-11-10 LAB — BASIC METABOLIC PANEL WITH GFR
BUN/Creatinine Ratio: 20 (ref 9–23)
BUN: 12 mg/dL (ref 6–24)
CO2: 23 mmol/L (ref 20–29)
Calcium: 9.3 mg/dL (ref 8.7–10.2)
Chloride: 103 mmol/L (ref 96–106)
Creatinine, Ser: 0.6 mg/dL (ref 0.57–1.00)
GFR calc Af Amer: 129 mL/min/1.73
GFR calc non Af Amer: 112 mL/min/1.73
Glucose: 83 mg/dL (ref 65–99)
Potassium: 3.8 mmol/L (ref 3.5–5.2)
Sodium: 140 mmol/L (ref 134–144)

## 2019-11-10 NOTE — Progress Notes (Signed)
Spanish/Normal result letter generated, sent via MyChart and mailed to address on file. 

## 2020-01-23 ENCOUNTER — Encounter: Payer: Self-pay | Admitting: Physician Assistant

## 2020-01-23 MED FILL — ?CETIRIZINE HCL 10 MG TABLE: 10 | 30 days supply | Qty: 30 | Fill #1

## 2020-01-23 MED FILL — METHOCARBAMOL 500 MG TABS: 500 | 20 days supply | Qty: 60 | Fill #1

## 2020-01-24 ENCOUNTER — Other Ambulatory Visit: Payer: Self-pay | Admitting: Physician Assistant

## 2020-01-24 DIAGNOSIS — B029 Zoster without complications: Secondary | ICD-10-CM

## 2020-01-24 MED ORDER — ACETAMINOPHEN 500 MG PO TABS: 500.0000 mg | ORAL_TABLET | Freq: Four times a day (QID) | ORAL | 2 refills | Status: DC | PRN

## 2020-01-24 MED ORDER — NAPROXEN 500 MG PO TABS: 500.0000 mg | ORAL_TABLET | Freq: Two times a day (BID) | ORAL | 0 refills | Status: DC

## 2020-01-24 MED FILL — NAPROXEN 500 MG TABLET: 500 | 30 days supply | Qty: 60 | Fill #0

## 2020-02-14 ENCOUNTER — Encounter: Payer: Self-pay | Admitting: Physician Assistant

## 2020-02-14 ENCOUNTER — Ambulatory Visit: Payer: Self-pay | Attending: Physician Assistant | Admitting: Physician Assistant

## 2020-02-14 ENCOUNTER — Other Ambulatory Visit: Payer: Self-pay | Admitting: Physician Assistant

## 2020-02-14 ENCOUNTER — Other Ambulatory Visit: Payer: Self-pay

## 2020-02-14 DIAGNOSIS — M25562 Pain in left knee: Secondary | ICD-10-CM

## 2020-02-14 DIAGNOSIS — G8929 Other chronic pain: Secondary | ICD-10-CM

## 2020-02-14 DIAGNOSIS — Z789 Other specified health status: Secondary | ICD-10-CM

## 2020-02-14 MED ORDER — NAPROXEN 500 MG PO TABS: 500.0000 mg | ORAL_TABLET | Freq: Two times a day (BID) | ORAL | 3 refills | Status: DC

## 2020-02-14 MED FILL — NAPROXEN 500 MG TABLET: 500 | 30 days supply | Qty: 60 | Fill #0

## 2020-02-14 NOTE — Progress Notes (Signed)
Virtual Visit via Telephone Note  I connected with Katherine [name] on 02/14/20 at  2:10 PM EST by telephone and verified that I am speaking with the correct person using two identifiers.  Location: Patient: home Provider: [facility]   I discussed the limitations, risks, security and privacy concerns of performing an evaluation and management service by telephone and the availability of in person appointments. I also discussed with the patient that there may be a patient responsible charge related to this service. The patient expressed understanding and agreed to proceed.  Katherine [name] interpreter, screened/roomed by Katherine [name] History of Present Illness: L knee pain esp with walking for about 2 weeks. NKI.  No redness. No swelling.  Tylenol helps a little.  Naproxen helps.   This occurs on and off intermittently.  She would like to see a specialist bc this occurs periodically  Observations/Objective: NAD.  A&Ox3  Assessment and Plan: 1. Chronic pain of left knee - Ambulatory referral to Orthopedic Surgery - naproxen (NAPROSYN) 500 MG tablet; Take 1 tablet (500 mg total) by mouth 2 (two) times daily with a meal. Prn pain  Dispense: 60 tablet; Refill: 3  2. Language barrier pacific interpreters used and additional time performing visit was required.    Follow Up Instructions: Assign new PCP in 4-6 months or sooner if needed   I discussed the assessment and treatment plan with the patient. The patient was provided an opportunity to ask questions and all were answered. The patient agreed with the plan and demonstrated an understanding of the instructions.   The patient was advised to call back or seek an in-person evaluation if the symptoms worsen or if the condition fails to improve as anticipated.  I provided 12 minutes of non-face-to-face time during this encounter.   Katherine Co, PA-C  Patient ID: Katherine [name], female   DOB: 1975-11-13, 45 y.o.   MRN: [id number]

## 2020-02-22 ENCOUNTER — Ambulatory Visit: Payer: Self-pay | Admitting: Orthopaedic Surgery

## 2020-02-27 ENCOUNTER — Ambulatory Visit: Payer: Self-pay | Admitting: Orthopaedic Surgery

## 2020-03-12 ENCOUNTER — Ambulatory Visit: Payer: Self-pay | Admitting: Orthopaedic Surgery

## 2020-04-25 ENCOUNTER — Ambulatory Visit: Payer: Self-pay | Attending: Family Medicine | Admitting: Physician Assistant

## 2020-04-25 ENCOUNTER — Other Ambulatory Visit: Payer: Self-pay

## 2020-04-25 ENCOUNTER — Encounter: Payer: Self-pay | Admitting: Physician Assistant

## 2020-04-25 VITALS — BP 111/72 | HR 60 | Resp 16 | Ht 63.0 in | Wt 203.6 lb

## 2020-04-25 DIAGNOSIS — Z791 Long term (current) use of non-steroidal anti-inflammatories (NSAID): Secondary | ICD-10-CM | POA: Insufficient documentation

## 2020-04-25 DIAGNOSIS — M25562 Pain in left knee: Secondary | ICD-10-CM

## 2020-04-25 DIAGNOSIS — I498 Other specified cardiac arrhythmias: Secondary | ICD-10-CM | POA: Insufficient documentation

## 2020-04-25 DIAGNOSIS — M545 Low back pain, unspecified: Secondary | ICD-10-CM

## 2020-04-25 DIAGNOSIS — Z603 Acculturation difficulty: Secondary | ICD-10-CM

## 2020-04-25 DIAGNOSIS — R252 Cramp and spasm: Secondary | ICD-10-CM

## 2020-04-25 DIAGNOSIS — R079 Chest pain, unspecified: Secondary | ICD-10-CM

## 2020-04-25 DIAGNOSIS — Z789 Other specified health status: Secondary | ICD-10-CM

## 2020-04-25 DIAGNOSIS — H6690 Otitis media, unspecified, unspecified ear: Secondary | ICD-10-CM

## 2020-04-25 DIAGNOSIS — G8929 Other chronic pain: Secondary | ICD-10-CM

## 2020-04-25 DIAGNOSIS — Z131 Encounter for screening for diabetes mellitus: Secondary | ICD-10-CM

## 2020-04-25 MED ORDER — FLUCONAZOLE 150 MG PO TABS: 150.0000 mg | ORAL_TABLET | Freq: Once | ORAL | 0 refills | Status: DC

## 2020-04-25 MED ORDER — AMOXICILLIN 500 MG PO CAPS: 500.0000 mg | ORAL_CAPSULE | Freq: Three times a day (TID) | ORAL | 0 refills | Status: DC

## 2020-04-25 MED ORDER — NAPROXEN 500 MG PO TABS
500.0000 mg | ORAL_TABLET | Freq: Two times a day (BID) | ORAL | 3 refills | Status: DC
Start: 2020-04-25 — End: 2020-04-25

## 2020-04-25 MED ORDER — METHOCARBAMOL 500 MG PO TABS
1000.0000 mg | ORAL_TABLET | Freq: Three times a day (TID) | ORAL | 2 refills | Status: DC | PRN
Start: 2020-04-25 — End: 2020-04-25

## 2020-04-25 MED FILL — AMOXICILLIN 500 MG CAPSULE: 500 | 10 days supply | Qty: 30 | Fill #0

## 2020-04-25 MED FILL — FLUCONAZOLE 150 MG TABLET: 150 | 1 days supply | Qty: 1 | Fill #0

## 2020-04-25 MED FILL — NAPROXEN 500 MG TABLET: 500 | 30 days supply | Qty: 60 | Fill #0

## 2020-04-25 MED FILL — METHOCARBAMOL 500 MG TABS: 500 | 10 days supply | Qty: 60 | Fill #0

## 2020-04-25 NOTE — Progress Notes (Signed)
Pt also states having knee pain.

## 2020-04-25 NOTE — Patient Instructions (Signed)
Sndrome de dolor femororrotuliano Patellofemoral Pain Syndrome  El sndrome de dolor femororrotuliano es una afeccin en la cual el tejido (cartlago) en la parte inferior de la rtula se ablanda o se descompone. Esto causa dolor en la cara anterior de la rodilla. A esta afeccin tambin se la conoce como rodilla de corredor o condromalacia rotuliana. El sndrome de dolor femororrotuliano es ms frecuente en adultos jvenes que practican deportes. La rodilla es la articulacin ms grande del cuerpo. La rtula cubre la cara anterior de la rodilla y est unida a los msculos por encima y por debajo de [place]. La parte inferior de la rtula est cubierta por un tipo suave de TEFL teacher (sinovia). La superficie suave ayuda a la rtula a deslizarse fcilmente al mover la rodilla. El sndrome de dolor femororrotuliano causa hinchazn en el revestimiento de la articulacin y las superficies seas de la rodilla. Cules son las causas? Esta afeccin puede ser causada por lo siguiente:  Uso excesivo de la rodilla.  Alineacin deficiente de las articulaciones de la rodilla.  Debilidad muscular en las piernas.  Un golpe directo en la rtula. Qu incrementa el riesgo? Es ms probable que usted sufra esta afeccin si:  Hace muchas actividades que pueden desgastar la rtula. Estas incluyen: ? Correr. ? Ponerse en cuclillas. ? Subir escaleras.  Usted comienza una nueva actividad fsica o un programa de ejercicio.  [place] zapatos que no le calzan bien.  No tiene fuerza [employer].  Tiene sobrepeso. Cules son los signos o sntomas? El sntoma principal de esta afeccin es el dolor de rodilla. Puede sentirse un dolor leve y continuo debajo de la rtula, en la cara anterior de la rodilla. Es posible que se sienta un sonido de chasquido o de crujido al Merchant navy officer rodilla. El Research officer, trade union al hacer las siguientes actividades:  Actividad fsica.  Subir escaleras.  Correr.  Al  saltar.  Ponerse en cuclillas.  Arrodillarse.  Sentarse durante [employer].  Mover la rtula o Occupational hygienist presin [employer]. Cmo se diagnostica? Esta afeccin se puede diagnosticar en funcin de lo siguiente:  Los sntomas y los antecedentes mdicos. Le puede preguntar [employer] actividades fsicas que realiz recientemente y cules causan dolor de rodilla.  Un examen fsico. Esto puede incluir: ? Mover la USAA atrs y [employer]. ? Controlar la amplitud de movimiento de la rodilla. ? Pedirle que se ponga en cuclillas o que salte para observar si tiene dolor. ? Verificar la fuerza de los msculos de las piernas.  Estudios por imgenes para Optometrist. Estos pueden incluir una resonancia magntica (RM) de la rodilla. Cmo se trata? Esta afeccin se puede tratar en el hogar con reposo, hielo, compresin y elevacin (rest, ice, compression, elevation [RICE]).  Otros tratamientos pueden incluir los siguientes:  Antiinflamatorios no esteroideos (AINE), como el ibuprofeno.  Fisioterapia para Therapist, music y [employer] de las piernas.  Plantillas (aparatos ortopdicos) para eliminar tensin de la rodilla.  Una rodillera o una frula para rodilla.  Cintas adhesivas en la piel.  Ciruga para extirpar el cartlago daado o mover la rtula a una mejor posicin. Esto es poco frecuente. Siga estas instrucciones en su casa: Si tiene un dispositivo ortopdico:  selo como se lo haya indicado el mdico. Quteselo solamente como se lo haya indicado el mdico.  Afljelo si los dedos de los pies se le adormecen, siente hormigueos o se le enfran y se tornan de Research officer, trade union.  Mantenga limpio el dispositivo  ortopdico.  Si el dispositivo ortopdico no es impermeable: ? No deje que se moje. ? Cbralo con un envoltorio hermtico cuando tome un bao de inmersin o [place]. Control del dolor, la rigidez y la hinchazn  Si se lo indican, aplique hielo sobre  la zona dolorida. Para hacer esto: ? Si [place] un dispositivo ortopdico desmontable, quteselo segn las indicaciones del mdico. ? Ponga el hielo en una bolsa plstica. ? Coloque una [employer] piel y Copy. ? Aplique el hielo durante , 2 o 3veces por da. ? Retire el hielo si la piel se pone de color rojo brillante. Esto es [employer]. Si no puede sentir dolor, calor o fro, tiene un mayor riesgo de que se dae la zona.  Mueva los dedos del pie con frecuencia para reducir la rigidez y la hinchazn.  Cuando est sentado o acostado, levante (eleve) la zona lesionada por encima del nivel del corazn.   Actividad  Descanse la rodilla.  Evite las actividades que causen dolor en la rodilla.  Realice ejercicios de elongacin y fortalecimiento como se lo hayan indicado el mdico o el fisioterapeuta.  Retome sus actividades normales segn lo indicado por el mdico. Pregntele al mdico qu actividades son seguras para usted. Indicaciones generales  Use los medicamentos de venta libre y los recetados solamente como se lo haya indicado el mdico.  Use frulas, rodilleras o dispositivos para caminar como se lo haya indicado su mdico.  No consuma ningn producto que contenga nicotina o tabaco, como cigarrillos, cigarrillos electrnicos y tabaco de Theatre manager. Estos pueden retrasar la recuperacin. Si necesita ayuda para dejar de consumir estos productos, consulte al [employer].  Cumpla con todas las visitas de seguimiento. Esto es importante. Comunquese con un mdico si:  Los sntomas empeoran.  No mejora con el cuidado en Advice worker. Resumen  El sndrome de dolor femororrotuliano es una afeccin en la cual el tejido (cartlago) en la parte inferior de la rtula se ab[place] o se descompone.  Esta afeccin causa hinchazn en el revestimiento de la articulacin y las superficies seas de la rodilla. Esto provoca dolor en la cara anterior de la rodilla.  Esta afeccin se puede  tratar en el hogar con reposo, hielo, compresin y elevacin (rest, ice, compression, elevation [RICE]).  Use frulas, rodilleras o dispositivos para caminar como se lo haya indicado su mdico. Esta informacin no tiene como fin reemplazar el consejo del mdico. Asegrese de hacerle al mdico cualquier pregunta que tenga. Document Revised: 08/15/2019 Document Reviewed: 08/15/2019 Elsevier Patient Education  2021 [employer].

## 2020-04-25 NOTE — Progress Notes (Signed)
Patient ID: Katherine [name], female   DOB: Sep 04, 1975, 45 y.o.   MRN: [id number]   Katherine [name], is a 45 y.o. female  GYK:[id number]  [id number]:[id number]  DOB - 1976/01/16  Subjective:  Chief Complaint and HPI: Katherine [name] is a 45 y.o. female here today Patient is complaining with several issues today.  L ear pain for 3 days.  No fever.  No URI s/sx.    Also low and mid back pain after moving some furniture while cleaning about 1.5 weeks ago.  No urinary s/sx.  No radicular s/sx.    She continues to have B knee pain that is episodic.  No swelling.  NKI.    Also c/o chest pain and pressure that is fleeting and episodic.  No pressure.  Occasional SOB.  This has been going on for about 1-2 weeks.  Lasts just a few seconds when it occurs.  No FHx early cardiac issues.    ROS:   Constitutional:  No f/c, No night sweats, No unexplained weight loss. EENT:  No vision changes, No blurry vision, No hearing changes. No mouth, throat, or additional ear problems.  Respiratory: No cough, No SOB Cardiac: + CP, no palpitations GI:  No abd pain, No N/V/D. GU: No Urinary s/sx Musculoskeletal: see above Neuro: No headache, no dizziness, no motor weakness.  Skin: No rash Endocrine:  No polydipsia. No polyuria.  Psych: Denies SI/HI  No problems updated.  ALLERGIES: No Known Allergies  PAST MEDICAL HISTORY: Past Medical History:  Diagnosis Date  . Abdominal pain   . Anemia   . Depression    postpartum depression  . Morbid obesity (HCC)   . Splenomegaly     MEDICATIONS AT HOME: Prior to Admission medications   Medication Sig Start Date End Date Taking? Authorizing Provider  amoxicillin (AMOXIL) 500 MG capsule Take 1 capsule (500 mg total) by mouth 3 (three) times daily. 04/25/20  Yes Georgian Co M, PA-C  fluconazole (DIFLUCAN) 150 MG tablet Take 1 tablet (150 mg total) by mouth once for 1 dose. 04/25/20 04/25/20 Yes [facility] Sanzo, Marzella Schlein, PA-C  fluticasone (FLONASE) 50  MCG/ACT nasal spray Place 2 sprays into both nostrils daily. As needed 11/09/19  Yes Fulp, Cammie, MD  valACYclovir (VALTREX) 1000 MG tablet Take 1 tablet (1,000 mg total) by mouth 3 (three) times daily. At the first sign of herpes outbreak 05/03/19  Yes Storm Frisk, MD  acetaminophen (TYLENOL) 500 MG tablet Take 1-2 tablets (500-1,000 mg total) by mouth every 6 (six) hours as needed for mild pain. Patient not taking: Reported on 04/25/2020 01/24/20   Anders Simmonds, PA-C  cetirizine (ZYRTEC) 10 MG tablet Take 1 tablet (10 mg total) by mouth at bedtime. As needed for nasal congestion Patient not taking: Reported on 04/25/2020 11/09/19   Fulp, Hewitt Shorts, MD  docusate sodium (COLACE) 100 MG capsule Take 1 capsule (100 mg total) by mouth 2 (two) times daily as needed. Patient not taking: Reported on 04/25/2020 12/07/18   Adam Phenix, MD  methocarbamol (ROBAXIN) 500 MG tablet Take 2 tablets (1,000 mg total) by mouth every 8 (eight) hours as needed for muscle spasms. 04/25/20   Anders Simmonds, PA-C  naproxen (NAPROSYN) 500 MG tablet Take 1 tablet (500 mg total) by mouth 2 (two) times daily with a meal. Prn pain 04/25/20   Anders Simmonds, PA-C  norethindrone (AYGESTIN) 5 MG tablet Take 1 tablet (5 mg total) by mouth daily. Patient not taking: Reported on 04/25/2020 08/15/19  Vena Austria, MD     Objective:  EXAM:   Vitals:   04/25/20 1408  BP: 111/72  Pulse: 60  Resp: 16  SpO2: 95%  Weight: 203 lb 9.6 oz (92.4 kg)  Height: 5\' 3"  (1.6 m)    General appearance : A&OX3. NAD. Non-toxic-appearing HEENT: Atraumatic and Normocephalic.  PERRLA. EOM intact.  R TM WNL;  LTM with scarring and erythema with distorted light reflex. Previous small hole in TM/ Neck: supple, no JVD. No cervical lymphadenopathy. No thyromegaly Chest/Lungs:  Breathing-non-labored, Good air entry bilaterally, breath sounds normal without rales, rhonchi, or wheezing  CVS: S1 S2 regular, no murmurs, gallops, rubs   Back-negative SLR B.  ROM ~90% of normal.  +spasm mid and lower back in paraspinus muscles Extremities: Bilateral Lower Ext shows no edema, both legs are warm to touch with = pulse throughout.  B knees-full S&ROM mild crepitus of knees on extension Neurology:  CN II-XII grossly intact, Non focal.   Psych:  TP linear. J/I WNL. Normal speech. Appropriate eye contact and affect.  Skin:  No Rash  Data Review Lab Results  Component Value Date   HGBA1C 5.4 11/09/2019   HGBA1C 5.6 11/02/2018   HGBA1C 5.4 05/08/2016     Assessment & Plan   1. Chest pain, unspecified type Likely musculoskeletal.  EKG without ST changes.  Sinus arrhythmia.   - Comprehensive metabolic panel - CBC with Differential/Platelet - Hemoglobin A1c - Thyroid Panel With TSH  2. Chronic bilateral low back pain without sciatica No red flags - methocarbamol (ROBAXIN) 500 MG tablet; Take 2 tablets (1,000 mg total) by mouth every 8 (eight) hours as needed for muscle spasms.  Dispense: 60 tablet; Refill: 2 - naproxen (NAPROSYN) 500 MG tablet; Take 1 tablet (500 mg total) by mouth 2 (two) times daily with a meal. Prn pain  Dispense: 60 tablet; Refill: 3  3. Acute otitis media, unspecified otitis media type - amoxicillin (AMOXIL) 500 MG capsule; Take 1 capsule (500 mg total) by mouth 3 (three) times daily.  Dispense: 30 capsule; Refill: 0 - fluconazole (DIFLUCAN) 150 MG tablet; Take 1 tablet (150 mg total) by mouth once for 1 dose.  Dispense: 1 tablet; Refill: 0  4. 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 Prn pain  Dispense: 60 tablet; Refill: 3  5. Cramp in lower leg - methocarbamol (ROBAXIN) 500 MG tablet; Take 2 tablets (1,000 mg total) by mouth every 8 (eight) hours as needed for muscle spasms.  Dispense: 60 tablet; Refill: 2  6. Screening for diabetes mellitus I have had a lengthy discussion and provided  education about insulin resistance and the intake of too much sugar/refined carbohydrates.  I have advised the patient to work at a goal of eliminating sugary drinks, candy, desserts, sweets, refined sugars, processed foods, and white carbohydrates.  The patient expresses understanding.  - Hemoglobin A1c  7. Language barrier [employer] interpreters(Jose) used and additional time performing visit was required.   Patient have been counseled extensively about nutrition and exercise  Return in about 4 months (around 08/25/2020) for assign PCP .  The patient was given clear instructions to go to ER or return to medical center if symptoms don't improve, worsen or new problems develop. The patient verbalized understanding. The patient was told to call to get lab results if they haven't heard anything in the next week.  Georgian Co, PA-C [facility] [place], [place] [phone]   04/25/2020, 2:29 PM

## 2020-04-26 LAB — COMPREHENSIVE METABOLIC PANEL
ALT: 20 IU/L (ref 0–32)
AST: 20 IU/L (ref 0–40)
Albumin/Globulin Ratio: 1.4 (ref 1.2–2.2)
Albumin: 4.3 g/dL (ref 3.8–4.8)
Alkaline Phosphatase: 92 IU/L (ref 44–121)
BUN/Creatinine Ratio: 19 (ref 9–23)
BUN: 11 mg/dL (ref 6–24)
Bilirubin Total: 0.2 mg/dL (ref 0.0–1.2)
CO2: 20 mmol/L (ref 20–29)
Calcium: 9.6 mg/dL (ref 8.7–10.2)
Chloride: 101 mmol/L (ref 96–106)
Creatinine, Ser: 0.58 mg/dL (ref 0.57–1.00)
Globulin, Total: 3 g/dL (ref 1.5–4.5)
Glucose: 90 mg/dL (ref 65–99)
Potassium: 4.3 mmol/L (ref 3.5–5.2)
Sodium: 138 mmol/L (ref 134–144)
Total Protein: 7.3 g/dL (ref 6.0–8.5)
eGFR: 115 mL/min/{1.73_m2} (ref >59)

## 2020-04-26 LAB — CBC WITH DIFFERENTIAL/PLATELET
Basophils Absolute: 0.1 10*3/uL (ref 0.0–0.2)
Basos: 1 %
EOS (ABSOLUTE): 0.2 10*3/uL (ref 0.0–0.4)
Eos: 2 %
Hematocrit: 36 % (ref 34.0–46.6)
Hemoglobin: 11.8 g/dL (ref 11.1–15.9)
Immature Grans (Abs): 0.1 10*3/uL (ref 0.0–0.1)
Immature Granulocytes: 1 %
Lymphocytes Absolute: 3.3 10*3/uL — ABNORMAL HIGH (ref 0.7–3.1)
Lymphs: 33 %
MCH: 27.3 pg (ref 26.6–33.0)
MCHC: 32.8 g/dL (ref 31.5–35.7)
MCV: 83 fL (ref 79–97)
Monocytes Absolute: 0.8 10*3/uL (ref 0.1–0.9)
Monocytes: 8 %
Neutrophils Absolute: 5.7 10*3/uL (ref 1.4–7.0)
Neutrophils: 55 %
Platelets: 299 10*3/uL (ref 150–450)
RBC: 4.33 x10E6/uL (ref 3.77–5.28)
RDW: 13.4 % (ref 11.7–15.4)
WBC: 10 10*3/uL (ref 3.4–10.8)

## 2020-04-26 LAB — HEMOGLOBIN A1C
Est. average glucose Bld gHb Est-mCnc: 108 mg/dL
Hgb A1c MFr Bld: 5.4 % (ref 4.8–5.6)

## 2020-04-26 LAB — THYROID PANEL WITH TSH
Free Thyroxine Index: 1.8 (ref 1.2–4.9)
T3 Uptake Ratio: 21 % — ABNORMAL LOW (ref 24–39)
T4, Total: 8.5 ug/dL (ref 4.5–12.0)
TSH: 0.641 u[IU]/mL (ref 0.450–4.500)

## 2020-04-30 ENCOUNTER — Encounter: Payer: Self-pay | Admitting: *Deleted

## 2020-09-24 ENCOUNTER — Emergency Department (HOSPITAL_COMMUNITY)
Admission: EM | Admit: 2020-09-24 | Discharge: 2020-09-24 | Disposition: A | Discharge status: Home / Self Care | Payer: Self-pay | Attending: Emergency Medicine | Admitting: Emergency Medicine

## 2020-09-24 ENCOUNTER — Other Ambulatory Visit: Payer: Self-pay

## 2020-09-24 DIAGNOSIS — K047 Periapical abscess without sinus: Secondary | ICD-10-CM

## 2020-09-24 DIAGNOSIS — R220 Localized swelling, mass and lump, head: Secondary | ICD-10-CM | POA: Insufficient documentation

## 2020-09-24 DIAGNOSIS — K0889 Other specified disorders of teeth and supporting structures: Secondary | ICD-10-CM | POA: Insufficient documentation

## 2020-09-24 DIAGNOSIS — Z8616 Personal history of COVID-19: Secondary | ICD-10-CM | POA: Insufficient documentation

## 2020-09-24 DIAGNOSIS — K029 Dental caries, unspecified: Secondary | ICD-10-CM | POA: Insufficient documentation

## 2020-09-24 LAB — CBC WITH DIFFERENTIAL/PLATELET
Abs Immature Granulocytes: 0.06 10*3/uL (ref 0.00–0.07)
Basophils Absolute: 0.1 10*3/uL (ref 0.0–0.1)
Basophils Relative: 1 %
Eosinophils Absolute: 0.2 10*3/uL (ref 0.0–0.5)
Eosinophils Relative: 2 %
HCT: 33 % — ABNORMAL LOW (ref 36.0–46.0)
Hemoglobin: 11.1 g/dL — ABNORMAL LOW (ref 12.0–15.0)
Immature Granulocytes: 1 %
Lymphocytes Relative: 23 %
Lymphs Abs: 2.7 10*3/uL (ref 0.7–4.0)
MCH: 28.4 pg (ref 26.0–34.0)
MCHC: 33.6 g/dL (ref 30.0–36.0)
MCV: 84.4 fL (ref 80.0–100.0)
Monocytes Absolute: 0.8 10*3/uL (ref 0.1–1.0)
Monocytes Relative: 7 %
Neutro Abs: 7.8 10*3/uL — ABNORMAL HIGH (ref 1.7–7.7)
Neutrophils Relative %: 66 %
Platelets: 256 10*3/uL (ref 150–400)
RBC: 3.91 MIL/uL (ref 3.87–5.11)
RDW: 12.9 % (ref 11.5–15.5)
WBC: 11.6 10*3/uL — ABNORMAL HIGH (ref 4.0–10.5)
nRBC: 0 % (ref 0.0–0.2)

## 2020-09-24 LAB — I-STAT BETA HCG BLOOD, ED (MC, WL, AP ONLY): I-stat hCG, quantitative: <5 m[IU]/mL (ref <5)

## 2020-09-24 LAB — BASIC METABOLIC PANEL
Anion gap: 7 (ref 5–15)
BUN: 7 mg/dL (ref 6–20)
CO2: 24 mmol/L (ref 22–32)
Calcium: 8.2 mg/dL — ABNORMAL LOW (ref 8.9–10.3)
Chloride: 107 mmol/L (ref 98–111)
Creatinine, Ser: 0.5 mg/dL (ref 0.44–1.00)
GFR, Estimated: >60 mL/min (ref >60)
Glucose, Bld: 94 mg/dL (ref 70–99)
Potassium: 3.9 mmol/L (ref 3.5–5.1)
Sodium: 138 mmol/L (ref 135–145)

## 2020-09-24 MED ORDER — IBUPROFEN 600 MG PO TABS
600.0000 mg | ORAL_TABLET | Freq: Four times a day (QID) | ORAL | 0 refills | Status: DC | PRN
  Filled 2020-09-24: qty 30, 7d supply, fill #0

## 2020-09-24 MED ORDER — ACETAMINOPHEN 500 MG PO TABS
1000.0000 mg | ORAL_TABLET | Freq: Once | ORAL | Status: AC
  Administered 2020-09-24: 1000 mg via ORAL
  Filled 2020-09-24: qty 2

## 2020-09-24 MED ORDER — CLINDAMYCIN PHOSPHATE 600 MG/50ML IV SOLN
600.0000 mg | Freq: Once | INTRAVENOUS | Status: AC
  Administered 2020-09-24: 600 mg via INTRAVENOUS
  Filled 2020-09-24: qty 50

## 2020-09-24 MED ORDER — IBUPROFEN 800 MG PO TABS: 800.0000 mg | ORAL_TABLET | Freq: Once | ORAL | Status: DC

## 2020-09-24 MED ORDER — KETOROLAC TROMETHAMINE 15 MG/ML IJ SOLN
15.0000 mg | Freq: Once | INTRAMUSCULAR | Status: AC
  Administered 2020-09-24: 15 mg via INTRAVENOUS
  Filled 2020-09-24: qty 1

## 2020-09-24 MED ORDER — CLINDAMYCIN HCL 150 MG PO CAPS
150.0000 mg | ORAL_CAPSULE | Freq: Four times a day (QID) | ORAL | 0 refills | Status: DC
  Filled 2020-09-24: qty 28, 7d supply, fill #0

## 2020-09-24 NOTE — ED Provider Notes (Signed)
Emergency Medicine Provider Triage Evaluation Note  Katherine [name] , a 45 y.o. female  was evaluated in triage.  Pt complains of dental pain.  Review of Systems  Positive: Dental pain, facial pain, forehead pain and around eye pain Negative: Fever, trouble swallowing, neck pain  Physical Exam  BP 135/76 (BP Location: Left Arm)   Pulse (!) 59   Temp 98.6 F (37 C) (Oral)   Resp 16   SpO2 98%  Gen:   Awake, no distress   Resp:  Normal effort  MSK:   Moves extremities without difficulty  Other:  Mouth: dental decay to tooth #4 with ttp and adjacent facial swelling.  No pain with eye movement.   Medical Decision Making  Medically screening exam initiated at 10:26 AM.  Appropriate orders placed.  Katherine [name] was informed that the remainder of the evaluation will be completed by another provider, this initial triage assessment does not replace that evaluation, and the importance of remaining in the ED until their evaluation is complete.  Dental pain x 3 days now radiates to face and towards eyes and forehead.  Does not have a dentist. No trouble swallowing.    Fayrene Helper, PA-C 09/24/20 1029    Pollyann Savoy, MD 09/24/20 [phone]

## 2020-09-24 NOTE — ED Provider Notes (Signed)
[facility] EMERGENCY DEPARTMENT Provider Note   CSN: [id number] Arrival date & time: 09/24/20  6948     History Chief Complaint  Patient presents with   Dental Pain   Facial Swelling    Katherine [name] is a 45 y.o. female.  The history is provided by the patient. The history is limited by a language barrier. A language interpreter was used.  Dental Pain Associated symptoms: no fever    45 year old female presenting for evaluation of dental pain.  Patient is Hispanic speaking, history obtained through language interpreter.  Patient reports she chipped one of her right upper tooth and for the past 3 days she has had progressive worsening pain swelling and achiness to the affected area.  Pain is moderate in severity worse with chewing and she does experience some abnormal taste in her mouth.  She has noticed increasing swelling to her face radiates towards her forehead.  No pain with eye movement no fever or trouble swallowing and no neck pain.  She tries taking home over-the-counter medication without adequate relief.  She reported the earliest that she can get a dentist appointment to get this fixed this in October.  Past Medical History:  Diagnosis Date   Abdominal pain    Anemia    Depression    postpartum depression   Morbid obesity (HCC)    Splenomegaly     Patient Active Problem List   Diagnosis Date Noted   History of COVID-19 05/03/2019   Pelvic pain 05/03/2019   Herpes zoster without complication 05/03/2019   Chronic bilateral low back pain without sciatica 05/03/2019   Ventral hernia without obstruction or gangrene    Spleen enlarged 07/31/2013   History of anemia 07/31/2013   Dental caries 09/06/2012    Past Surgical History:  Procedure Laterality Date   nexplanon     NO PAST SURGERIES     ROBOTIC ASSISTED LAPAROSCOPIC VENTRAL/INCISIONAL HERNIA REPAIR N/A 01/04/2018   Procedure: ROBOTIC ASSISTED LAPAROSCOPIC VENTRAL/INCISIONAL  HERNIA REPAIR;  Surgeon: Leafy Ro, MD;  Location: [facility] ORS;  Service: General;  Laterality: N/A;   ROBOTIC ASSISTED SALPINGO OOPHERECTOMY Bilateral 01/04/2018   Procedure: ROBOTIC ASSISTED SALPINGECTOMY;  Surgeon: Vena Austria, MD;  Location: [facility] ORS;  Service: Gynecology;  Laterality: Bilateral;   TUBAL LIGATION     UPPER GI ENDOSCOPY  2015     OB History     Gravida  6   Para  6   Term  6   Preterm      AB      Living  6      SAB      IAB      Ectopic      Multiple  0   Live Births  6           Family History  Problem Relation Age of Onset   Diabetes Father    Kidney disease Brother     Social History   Tobacco Use   Smoking status: Never   Smokeless tobacco: Never  Vaping Use   Vaping Use: Never used  Substance Use Topics   Alcohol use: No   Drug use: No    Home Medications Prior to Admission medications   Medication Sig Start Date End Date Taking? Authorizing Provider  acetaminophen (TYLENOL) 500 MG tablet Take 1-2 tablets (500-1,000 mg total) by mouth every 6 (six) hours as needed for mild pain. Patient not taking: Reported on 04/25/2020 01/24/20   Anders Simmonds,  PA-C  amoxicillin (AMOXIL) 500 MG capsule TAKE 1 CAPSULE (500 MG TOTAL) BY MOUTH 3 (THREE) TIMES DAILY. 04/25/20 04/25/21  Anders Simmonds, PA-C  amoxicillin-clavulanate (AUGMENTIN) 500-125 MG tablet TAKE 1 TABLET (500 MG TOTAL) BY MOUTH 2 (TWO) TIMES DAILY FOR 7 DAYS. TAKE AFTER EATING 11/09/19 11/08/20  Fulp, Cammie, MD  cetirizine (ZYRTEC) 10 MG tablet TAKE 1 TABLET (10 MG TOTAL) BY MOUTH AT BEDTIME. AS NEEDED FOR NASAL CONGESTION Patient not taking: Reported on 04/25/2020 11/09/19 11/08/20  Fulp, Cammie, MD  docusate sodium (COLACE) 100 MG capsule Take 1 capsule (100 mg total) by mouth 2 (two) times daily as needed. Patient not taking: Reported on 04/25/2020 12/07/18   Adam Phenix, MD  fluticasone [facility]) 50 MCG/ACT nasal spray PLACE 2 SPRAYS INTO BOTH NOSTRILS  DAILY. AS NEEDED 11/09/19 11/08/20  Fulp, Cammie, MD  methocarbamol (ROBAXIN) 500 MG tablet TAKE 2 TABLETS (1,000 MG TOTAL) BY MOUTH EVERY 8 (EIGHT) HOURS AS NEEDED FOR MUSCLE SPASMS. 04/25/20 04/25/21  Anders Simmonds, PA-C  naproxen (NAPROSYN) 500 MG tablet TAKE 1 TABLET (500 MG TOTAL) BY MOUTH 2 (TWO) TIMES DAILY WITH A MEAL. AS NEEDED PAIN 04/25/20 04/25/21  Anders Simmonds, PA-C  norethindrone (AYGESTIN) 5 MG tablet Take 1 tablet (5 mg total) by mouth daily. Patient not taking: Reported on 04/25/2020 08/15/19   Vena Austria, MD  valACYclovir (VALTREX) 1000 MG tablet Take 1 tablet (1,000 mg total) by mouth 3 (three) times daily. At the first sign of herpes outbreak 05/03/19   Storm Frisk, MD    Allergies    Patient has no known allergies.  Review of Systems   Review of Systems  Constitutional:  Negative for fever.  HENT:  Positive for dental problem.   All other systems reviewed and are negative.  Physical Exam Updated Vital Signs BP 135/76 (BP Location: Left Arm)   Pulse (!) 59   Temp 98.6 F (37 C) (Oral)   Resp 16   Ht 5\' 5"  (1.651 m)   Wt 83.9 kg   SpO2 98%   BMI 30.79 kg/m   Physical Exam Vitals and nursing note reviewed.  Constitutional:      General: She is not in acute distress.    Appearance: She is well-developed. She is obese.  HENT:     Head: Atraumatic.     Mouth/Throat:     Comments: Dental decay noted to tooth #4 with adjacent facial swelling.  Area tender to palpation but no obvious abscess amenable for drainage.  No trismus. Eyes:     Extraocular Movements: Extraocular movements intact.     Conjunctiva/sclera: Conjunctivae normal.     Pupils: Pupils are equal, round, and reactive to light.     Comments: No pain with eye movement no erythema to the face.  Cardiovascular:     Rate and Rhythm: Normal rate and regular rhythm.  Pulmonary:     Effort: Pulmonary effort is normal.  Musculoskeletal:     Cervical back: Normal range of motion and neck  supple. No rigidity.  Lymphadenopathy:     Cervical: No cervical adenopathy.  Skin:    Findings: No rash.  Neurological:     Mental Status: She is alert.  Psychiatric:        Mood and Affect: Mood normal.    ED Results / Procedures / Treatments   Labs (all labs ordered are listed, but only abnormal results are displayed) Labs Reviewed  CBC WITH DIFFERENTIAL/PLATELET - Abnormal; Notable for the  following components:      Result Value   WBC 11.6 (*)    Hemoglobin 11.1 (*)    HCT 33.0 (*)    Neutro Abs 7.8 (*)    All other components within normal limits  BASIC METABOLIC PANEL - Abnormal; Notable for the following components:   Calcium 8.2 (*)    All other components within normal limits  I-STAT BETA HCG BLOOD, ED ([facility], [facility], [facility] ONLY)  POC URINE PREG, ED    EKG None  Radiology No results found.  Procedures Procedures   Medications Ordered in ED Medications  clindamycin (CLEOCIN) IVPB 600 mg (has no administration in time range)  acetaminophen (TYLENOL) tablet 1,000 mg (1,000 mg Oral Given 09/24/20 1044)  ketorolac (TORADOL) 15 MG/ML injection 15 mg (15 mg Intravenous Given 09/24/20 1440)    ED Course  I have reviewed the triage vital signs and the nursing notes.  Pertinent labs & imaging results that were available during my care of the patient were reviewed by me and considered in my medical decision making (see chart for details).    MDM Rules/Calculators/A&P                           BP 121/73   Pulse (!) 52   Temp 98.6 F (37 C) (Oral)   Resp 16   Ht 5\' 5"  (1.651 m)   Wt 83.9 kg   SpO2 100%   BMI 30.79 kg/m   Final Clinical Impression(s) / ED Diagnoses Final diagnoses:  Pain, dental    Rx / DC Orders ED Discharge Orders     None      3:10 PM Patient here with dental pain and associated facial involvement concerning for periapical abscess with facial involvement.  I have low suspicion for orbital cellulitis or deep tissue infection.  Will  treat with clindamycin and will give referral to dentist for further management.   , PA-C 09/24/20 1616    09/26/20, MD 09/26/20 [phone]

## 2020-09-24 NOTE — ED Triage Notes (Signed)
Pt here d/t dental pain on right side. Reports molar is chipped and unable to get it fixed until appt in Oct. Increased swelling and pain X3 days. 10/10. Swelling noted. Airway intact.

## 2020-09-24 NOTE — Discharge Instructions (Addendum)
Please take antibiotic as prescribed for your dental infection.  Call and follow-up closely with a dentist for further managements of your condition.  Return if you have any concern.  Tome el antibitico segn lo recetado para su infeccin dental. Llame y haga un seguimiento de cerca con un dentista para obtener ms informacin sobre su afeccin. Regrese si tiene alguna inquietud.

## 2020-09-25 ENCOUNTER — Other Ambulatory Visit: Payer: Self-pay

## 2020-09-26 ENCOUNTER — Encounter: Payer: Self-pay | Admitting: Physician Assistant

## 2020-09-26 ENCOUNTER — Ambulatory Visit: Payer: Self-pay | Attending: Physician Assistant | Admitting: Physician Assistant

## 2020-09-26 ENCOUNTER — Other Ambulatory Visit: Payer: Self-pay

## 2020-09-26 DIAGNOSIS — Z09 Encounter for follow-up examination after completed treatment for conditions other than malignant neoplasm: Secondary | ICD-10-CM

## 2020-09-26 DIAGNOSIS — Z789 Other specified health status: Secondary | ICD-10-CM

## 2020-09-26 DIAGNOSIS — K047 Periapical abscess without sinus: Secondary | ICD-10-CM

## 2020-09-26 NOTE — Progress Notes (Signed)
Patient ID: Katherine [name], female   DOB: 1975/04/15, 45 y.o.   MRN: [id number] Virtual Visit via Telephone Note  I connected with Katherine [name] on 09/26/20 at 11:10 AM EDT by telephone and verified that I am speaking with the correct person using two identifiers.  Location: Patient: home Provider: [facility] office  Katherine [name] with pacific interpreters   I discussed the limitations, risks, security and privacy concerns of performing an evaluation and management service by telephone and the availability of in person appointments. I also discussed with the patient that there may be a patient responsible charge related to this service. The patient expressed understanding and agreed to proceed.   History of Present Illness:  seen at ED 09/24/2020 for apical dental abscess.  Needs to see an oral surgeon.  She was placed on Clindamycin.  She has noticed a little improvement in the swelling.  No fever.      Observations/Objective:  NAD.  A&Ox3   Assessment and Plan: 1. Apical abscess Continue/complete clindamycin.  If worsens/increased swelling, fever, constriction of vision-go immediately to UC or ED.  Patient verbalizes understanding - Ambulatory referral to Dentistry  2. Encounter for examination following treatment at hospital - Ambulatory referral to Dentistry  3. Language barrier [employer] interpreters used and additional time performing visit was required.     Follow Up Instructions: Prn    I discussed the assessment and treatment plan with the patient. The patient was provided an opportunity to ask questions and all were answered. The patient agreed with the plan and demonstrated an understanding of the instructions.   The patient was advised to call back or seek an in-person evaluation if the symptoms worsen or if the condition fails to improve as anticipated.  I provided 15 minutes of non-face-to-face time during this encounter.   Georgian Co, PA-C

## 2020-10-03 ENCOUNTER — Telehealth: Payer: Self-pay

## 2020-10-03 NOTE — Telephone Encounter (Signed)
Copied from CRM [id number]. Topic: General - Other >> Sep 27, 2020  9:54 AM Marylen Ponto wrote: Reason for CRM: Pt daughter called for update on referral for dentist. Pt daughter stated she contacted dentist for an appt for pt but the referral had not been received. Advised that there is a referral for dentistry dated 09/26/20.

## 2020-10-15 ENCOUNTER — Other Ambulatory Visit: Payer: Self-pay | Admitting: Obstetrics and Gynecology

## 2020-10-15 DIAGNOSIS — Z1231 Encounter for screening mammogram for malignant neoplasm of breast: Secondary | ICD-10-CM

## 2020-10-17 ENCOUNTER — Other Ambulatory Visit: Payer: Self-pay | Admitting: Obstetrics and Gynecology

## 2020-10-17 DIAGNOSIS — Z1231 Encounter for screening mammogram for malignant neoplasm of breast: Secondary | ICD-10-CM

## 2020-10-29 ENCOUNTER — Other Ambulatory Visit: Payer: Self-pay

## 2020-10-29 ENCOUNTER — Ambulatory Visit: Payer: Self-pay

## 2020-10-29 MED FILL — Fluticasone Propionate Nasal Susp 50 MCG/ACT: NASAL | 30 days supply | Qty: 16 | Fill #0 | Status: CN

## 2020-11-04 ENCOUNTER — Other Ambulatory Visit: Payer: Self-pay

## 2020-11-05 ENCOUNTER — Other Ambulatory Visit: Payer: Self-pay

## 2021-02-27 ENCOUNTER — Ambulatory Visit: Payer: Self-pay | Admitting: *Deleted

## 2021-02-27 NOTE — Telephone Encounter (Signed)
°  Chief Complaint: Patient's daughter is calling with report of chest, back and leg pain for patient- she is not with patient Symptoms: chest pain- unable to answer triage- advised have mother call - we can get interpreter- this is a high risk complaint- she may need ED Frequency: Started this week Pertinent Negatives: Patient denies  Disposition: [] ED /[] Urgent Care (no appt availability in office) / [] Appointment(In office/virtual)/ []  [place] [facility]/ [] Home Care/ [] Refused Recommended Disposition /[]  Mobile Bus/ []  Follow-up with PCP Additional Notes: Daughter will call her mother and have her call back

## 2021-02-27 NOTE — Telephone Encounter (Signed)
Answer Assessment - Initial Assessment Questions 1. LOCATION: "Where does it hurt?"       Not sure- feels pressure 2. RADIATION: "Does the pain go anywhere else?" (e.g., into neck, jaw, arms, back)     *No Answer* 3. ONSET: "When did the chest pain begin?" (Minutes, hours or days)      Started this week 4. PATTERN "Does the pain come and go, or has it been constant since it started?"  "Does it get worse with exertion?"      *No Answer* 5. DURATION: "How long does it last" (e.g., seconds, minutes, hours)     *No Answer* 6. SEVERITY: "How bad is the pain?"  (e.g., Scale 1-10; mild, moderate, or severe)    - MILD (1-3): doesn't interfere with normal activities     - MODERATE (4-7): interferes with normal activities or awakens from sleep    - SEVERE (8-10): excruciating pain, unable to do any normal activities       *No Answer* 7. CARDIAC RISK FACTORS: "Do you have any history of heart problems or risk factors for heart disease?" (e.g., angina, prior heart attack; diabetes, high blood pressure, high cholesterol, smoker, or strong family history of heart disease)     *No Answer* 8. PULMONARY RISK FACTORS: "Do you have any history of lung disease?"  (e.g., blood clots in lung, asthma, emphysema, birth control pills)     *No Answer* 9. CAUSE: "What do you think is causing the chest pain?"     *No Answer* 10. OTHER SYMPTOMS: "Do you have any other symptoms?" (e.g., dizziness, nausea, vomiting, sweating, fever, difficulty breathing, cough)       *No Answer* 11. PREGNANCY: "Is there any chance you are pregnant?" "When was your last menstrual period?"       *No Answer*  Protocols used: Chest Pain-A-AH

## 2021-03-03 NOTE — Telephone Encounter (Signed)
[employer] interperter: Sofia ID: [phone]  Contacted pt to get more information pt didn't answer lvm

## 2021-04-02 ENCOUNTER — Other Ambulatory Visit: Payer: Self-pay

## 2021-04-02 DIAGNOSIS — Z1231 Encounter for screening mammogram for malignant neoplasm of breast: Secondary | ICD-10-CM

## 2021-04-24 ENCOUNTER — Encounter: Payer: Self-pay | Admitting: Internal Medicine

## 2021-04-24 ENCOUNTER — Ambulatory Visit: Payer: Self-pay | Attending: Internal Medicine | Admitting: Internal Medicine

## 2021-04-24 VITALS — BP 140/70 | HR 73 | Resp 16 | Wt 213.4 lb

## 2021-04-24 DIAGNOSIS — R519 Headache, unspecified: Secondary | ICD-10-CM

## 2021-04-24 DIAGNOSIS — R030 Elevated blood-pressure reading, without diagnosis of hypertension: Secondary | ICD-10-CM

## 2021-04-24 DIAGNOSIS — M79672 Pain in left foot: Secondary | ICD-10-CM

## 2021-04-24 MED ORDER — DICLOFENAC SODIUM 1 % EX GEL
2.0000 g | Freq: Four times a day (QID) | CUTANEOUS | 0 refills | Status: DC
  Filled 2021-04-24: qty 100, 12d supply, fill #0

## 2021-04-24 NOTE — Patient Instructions (Signed)
Plan de alimentaci?n DASH ?DASH Eating Plan ?DASH es la sigla en ingl?s de ?Enfoques Alimentarios para Detener la Hipertensi?n?. El plan de alimentaci?n DASH ha demostrado: ?Bajar la presi?n arterial elevada (hipertensi?n). ?Reducir el riesgo de diabetes tipo 2, enfermedad card?aca y accidente cerebrovascular. ?Ayudar a perder peso. ?Consejos para seguir este plan ?Leer las etiquetas de los alimentos ?Verifique la cantidad de sal (sodio) por porci?n en las etiquetas de los alimentos. Elija alimentos con menos del 5 por ciento del valor diario de sodio. Generalmente, los alimentos con menos de 300 miligramos (mg) de sodio por porci?n se encuadran dentro de este plan alimentario. ?Para encontrar cereales integrales, busque la palabra "integral" como primera palabra en la lista de ingredientes. ?Al ir de compras ?Compre productos en los que en su etiqueta diga: ?bajo contenido de sodio? o ?sin agregado de sal?. ?Compre alimentos frescos. Evite los alimentos enlatados y comidas precocidas o congeladas. ?Al cocinar ?Evite agregar sal cuando cocine. Use hierbas o aderezos sin sal, en lugar de sal de mesa o sal marina. Consulte al m?dico o farmac?utico antes de usar sustitutos de la sal. ?No fr?a los alimentos. A la hora de cocinar los alimentos opte por hornearlos, hervirlos, grillarlos, asarlos al horno y asarlos a la parrilla. ?Cocine con aceites cardiosaludables, como oliva, canola, aguacate, soja o girasol. ?Planificaci?n de las comidas ? ?Consuma una dieta equilibrada, que incluya lo siguiente: ?4 o m?s porciones de frutas y 4 o m?s porciones de verduras por d?a. Trate de que medio plato de cada comida sea de frutas y verduras. ?De 6 a 8 porciones de cereales integrales todos los d?as. ?Menos de 6 onzas (170 g) de carne, aves o pescado magros por d?a. Una porci?n de 3 onzas (85 g) de carne tiene casi el mismo tama?o que un mazo de cartas. Un huevo equivale a 1 onza (28 g). ?De 2 a 3 porciones de productos l?cteos  descremados por d?a. Una porci?n es 1 taza (237 ml). ?1 porci?n de frutos secos, semillas o frijoles 5 veces por semana. ?De 2 a 3 porciones de grasas cardiosaludables. Las grasas saludables llamadas ?cidos grasos omega-3 se encuentran en alimentos como las nueces, las semillas de lino, las leches fortificadas y los huevos. Estas grasas tambi?n se encuentran en los pescados de agua fr?a, como la sardina, el salm?n y la caballa. ?Limite la cantidad que consume de: ?Alimentos enlatados o envasados. ?Alimentos con alto contenido de grasa trans, como algunos alimentos fritos. ?Alimentos con alto contenido de grasa saturada, como carne con grasa. ?Postres y otros dulces, bebidas azucaradas y otros alimentos con az?car agregada. ?Productos l?cteos enteros. ?No le agregue sal a los alimentos antes de probarlos. ?No coma m?s de 4 yemas de huevo por semana. ?Trate de comer al menos 2 comidas vegetarianas por semana. ?Consuma m?s comida casera y menos de restaurante, de bares y comida r?pida. ?Estilo de vida ?Cuando coma en un restaurante, pida que preparen su comida con menos sal o, en lo posible, sin nada de sal. ?Si bebe alcohol: ?Limite la cantidad que bebe: ?De 0 a 1 medida por d?a para las mujeres que no est?n embarazadas. ?De 0 a 2 medidas por d?a para los hombres. ?Est? atento a la cantidad de alcohol que hay en las bebidas que toma. En los [place], una medida equivale a una botella de cerveza de 12 oz (355 ml), un vaso de vino de 5 oz (148 ml) o un vaso de una bebida alcoh?lica de alta graduaci?n de 1? oz (  44 ml). ?Informaci?n general ?Evite ingerir m?s de 2300 mg de sal por d?a. Si tiene hipertensi?n, es posible que necesite reducir la ingesta de sodio a 1,500 mg por d?a. ?Trabaje con su m?dico para mantener un peso saludable o perder peso. Preg?ntele cu?l es el peso recomendado para usted. ?Realice al menos 30 minutos de ejercicio que haga que se acelere su coraz?n (ejercicio aer?bico) la mayor?a de los d?as  de la semana. Estas actividades pueden incluir caminar, nadar o andar en bicicleta. ?Trabaje con su m?dico o nutricionista para ajustar su plan alimentario a sus necesidades cal?ricas personales. ??Qu? alimentos debo comer? ?Frutas ?Todas las frutas frescas, congeladas o disecadas. Frutas enlatadas en jugo natural (sin agregado de az?car). ?Verduras ?Verduras frescas o congeladas (crudas, al vapor, asadas o grilladas). Jugos de tomate y verduras con bajo contenido de sodio o reducidos en sodio. Salsa y pasta de tomate con bajo contenido de sodio o reducidas en sodio. Verduras enlatadas con bajo contenido de sodio o reducidas en sodio. ?Granos ?Pan de salvado o integral. Pasta de salvado o integral. Arroz integral. Avena. Quinua. Trigo burgol. Cereales integrales y con bajo contenido de sodio. Pan pita. Galletitas de agua con bajo contenido de grasa y sodio. Tortillas de harina integral. ?Carnes y otras prote?nas ?Pollo o pavo sin piel. Carne de pollo o de pavo molida. Cerdo desgrasado. Pescado y mariscos. Claras de huevo. Porotos, guisantes o lentejas secos. Frutos secos, mantequilla de frutos secos y semillas sin sal. Frijoles enlatados sin sal. Cortes de carne vacuna magra, desgrasada. Carne precocida o curada magra y baja en sodio, como embutidos o panes de carne. ?L?cteos ?Leche descremada (1 %) o descremada. Quesos reducidos en grasa, con bajo contenido de grasa o descremados. Queso blanco o ricota sin grasa, con bajo contenido de sodio. Yogur semidescremado o descremado. Queso con bajo contenido de grasa y sodio. ?Grasas y aceites ?Margarinas untables que no contengan grasas trans. Aceite vegetal. Mayonesa y aderezos para ensaladas livianos, reducidos en grasa o con bajo contenido de grasas (reducidos en sodio). Aceite de canola, c?rtamo, oliva, aguacate, soja y girasol. Aguacate. ?Ali?os y condimentos ?Hierbas. Especias. Mezclas de condimentos sin sal. ?Otros alimentos ?Palomitas de ma?z y pretzels sin sal.  Dulces con bajo contenido de grasas. ?Es posible que los productos que se enumeran m?s arriba no constituyan una lista completa de los alimentos y las bebidas que puede tomar. Consulte a un nutricionista para obtener m?s informaci?n. ??Qu? alimentos debo evitar? ?Frutas ?Fruta enlatada en alm?bar liviano o espeso. Frutas cocidas en aceite. Frutas con salsa de crema o mantequilla. ?Verduras ?Verduras con crema o fritas. Verduras en salsa de queso. Verduras enlatadas regulares (que no sean con bajo contenido de sodio o reducidas en sodio). Pasta y salsa de tomates enlatadas regulares (que no sean con bajo contenido de sodio o reducidas en sodio). Jugos de tomate y verduras regulares (que no sean con bajo contenido de sodio o reducidos en sodio). Pepinillos. Aceitunas. ?Granos ?Productos de panificaci?n hechos con grasa, como medialunas, magdalenas y algunos panes. Comidas con arroz o pasta seca listas para usar. ?Carnes y otras prote?nas ?Cortes de carne con alto contenido de grasa. Costillas. Carne frita. Tocino. Mortadela, salame y otras carnes precocidas o curadas, como embutidos o panes de carne. Grasa de la espalda del cerdo (panceta). Salchicha de cerdo. Frutos secos y semillas con sal. Frijoles enlatados con agregado de sal. Pescado enlatado o ahumado. Huevos enteros o yemas. Pollo o pavo con piel. ?L?cteos ?Leche entera o al 2 %, crema   y mitad leche y mitad crema. Queso crema entero o con toda su grasa. Yogur entero o endulzado. Quesos con toda su grasa. Sustitutos de cremas no l?cteas. Coberturas batidas. Quesos para untar y quesos procesados. ?Grasas y aceites ?Mantequilla. Margarina en barra. Manteca de cerdo. Lardo. Mantequilla clarificada. Grasa de panceta. Aceites tropicales como aceite de coco, palmiste o palma. ?Ali?os y condimentos ?Sal de cebolla, sal de ajo, sal condimentada, sal de mesa y sal marina. Salsa Worcester[place]. Salsa t?rtara. Salsa barbacoa. Salsa teriyaki. Salsa de soja, incluso la que  tiene contenido reducido de sodio. Salsa de carne. Salsas en lata y envasadas. Salsa de pescado. Salsa de ostras. Salsa rosada. R?banos picantes comprados en tiendas. K?tchup. Mostaza. Saborizantes y tie

## 2021-04-24 NOTE — Progress Notes (Signed)
? ? ?Patient ID: Katherine [name], female    DOB: Apr 04, 1975  MRN: [id number] ? ?CC: Abdominal Pain (Pt states she had hernia surgery and her discomfort is coming from that aera) and Foot Pain (Left foot mostly /Pt states she feels a pulse and sharp pain when stands up/Pain has been gong on for a month /) ? ? ?Subjective: ?Katherine [name] is a 46 y.o. female who presents for urgent care visit ?Her concerns today include:  ? ?Pt c/o pain on lateral dorsal aspect  LT foot x 2 mth ?No initiating factors ?Worse with prolong walking or stretching ?Endorses swelling at times. ?She has been soaking foot in warm water with episom salt and finds this to be helpful. ? ?Complains of intermittent pain pain over maxillary sinuses x 1 mth ?She has sinus congestion sometimes but not presently.  No fever at this time but states that she sometimes feels she has fever when it gets to hurting.  No discolored mucus from the nostrils ?Patient Active Problem List  ? Diagnosis Date Noted  ? History of COVID-19 05/03/2019  ? Pelvic pain 05/03/2019  ? Herpes zoster without complication 05/03/2019  ? Chronic bilateral low back pain without sciatica 05/03/2019  ? Ventral hernia without obstruction or gangrene   ? Spleen enlarged 07/31/2013  ? History of anemia 07/31/2013  ? Dental caries 09/06/2012  ?  ? ?Current Outpatient Medications on File Prior to Visit  ?Medication Sig Dispense Refill  ? clindamycin (CLEOCIN) 150 MG capsule Take 1 capsule (150 mg total) by mouth every 6 (six) hours. (Patient not taking: Reported on 04/24/2021) 28 capsule 0  ? fluticasone (FLONASE) 50 MCG/ACT nasal spray PLACE 2 SPRAYS INTO BOTH NOSTRILS DAILY AS NEEDED 16 g 3  ? ibuprofen (ADVIL) 600 MG tablet Take 1 tablet (600 mg total) by mouth every 6 (six) hours as needed. (Patient not taking: Reported on 04/24/2021) 30 tablet 0  ? methocarbamol (ROBAXIN) 500 MG tablet TAKE 2 TABLETS (1,000 MG TOTAL) BY MOUTH EVERY 8 (EIGHT) HOURS AS NEEDED FOR MUSCLE  SPASMS. (Patient not taking: Reported on 04/24/2021) 60 tablet 2  ? naproxen (NAPROSYN) 500 MG tablet TAKE 1 TABLET (500 MG TOTAL) BY MOUTH 2 (TWO) TIMES DAILY WITH A MEAL. AS NEEDED PAIN (Patient not taking: Reported on 04/24/2021) 60 tablet 3  ? valACYclovir (VALTREX) 1000 MG tablet Take 1 tablet (1,000 mg total) by mouth 3 (three) times daily. At the first sign of herpes outbreak (Patient not taking: Reported on 04/24/2021) 21 tablet 1  ? ?No current facility-administered medications on file prior to visit.  ? ? ?No Known Allergies ? ?Social History  ? ?Socioeconomic History  ? Marital status: Single  ?  Spouse name: Not on file  ? Number of children: 5  ? Years of education: Not on file  ? Highest education level: Not on file  ?Occupational History  ? Not on file  ?Tobacco Use  ? Smoking status: Never  ? Smokeless tobacco: Never  ?Vaping Use  ? Vaping Use: Never used  ?Substance and Sexual Activity  ? Alcohol use: No  ? Drug use: No  ? Sexual activity: Yes  ?  Birth control/protection: Surgical  ?Other Topics Concern  ? Not on file  ?Social History Narrative  ? Not on file  ? ?Social Determinants of Health  ? ?Financial Resource Strain: Not on file  ?Food Insecurity: Not on file  ?Transportation Needs: Not on file  ?Physical Activity: Not on file  ?Stress: Not on  file  ?Social Connections: Not on file  ?Intimate Partner Violence: Not on file  ? ? ?Family History  ?Problem Relation Age of Onset  ? Diabetes Father   ? Kidney disease Brother   ? ? ?Past Surgical History:  ?Procedure Laterality Date  ? nexplanon    ? NO PAST SURGERIES    ? ROBOTIC ASSISTED LAPAROSCOPIC VENTRAL/INCISIONAL HERNIA REPAIR N/A 01/04/2018  ? Procedure: ROBOTIC ASSISTED LAPAROSCOPIC VENTRAL/INCISIONAL HERNIA REPAIR;  Surgeon: Leafy RoPabon, [name] F, MD;  Location: [facility] ORS;  Service: General;  Laterality: N/A;  ? ROBOTIC ASSISTED SALPINGO OOPHERECTOMY Bilateral 01/04/2018  ? Procedure: ROBOTIC ASSISTED SALPINGECTOMY;  Surgeon: Vena AustriaStaebler, Andreas, MD;   Location: [facility] ORS;  Service: Gynecology;  Laterality: Bilateral;  ? TUBAL LIGATION    ? UPPER GI ENDOSCOPY  2015  ? ? ?ROS: ?Review of Systems ?Negative except as stated above ? ?PHYSICAL EXAM: ?BP 140/70   Pulse 73   Resp 16   Wt 213 lb 6.4 oz (96.8 kg)   SpO2 96%   BMI 35.51 kg/m?   ?Physical Exam ? ?General appearance - alert, well appearing, middle-age obese Hispanic female and in no distress ?Mental status - normal mood, behavior, speech, dress, motor activity, and thought processes ?Nose - normal and patent, no erythema, discharge or polyps ?Mouth - mucous membranes moist, pharynx normal without lesions ?No tenderness on palpation over the maxillary sinuses. ?Neck - supple, no significant adenopathy ?Musculoskeletal -left foot: No edema or erythema seen at this time.  She has mild point tenderness along the lateral aspect of the dorsal foot.  She has good range of motion of the ankle.  Both feet are warm to touch.  Good dorsalis pedis pulses. ? ? ? ?  Latest Ref Rng & Units 09/24/2020  ? 11:31 AM 04/25/2020  ?  2:32 PM 11/09/2019  ?  3:28 PM  ?CMP  ?Glucose 70 - 99 mg/dL 94   90   83    ?BUN 6 - 20 mg/dL 7   11   12     ?Creatinine 0.44 - 1.00 mg/dL 7.820.50   9.560.58   2.130.60    ?Sodium 135 - 145 mmol/L 138   138   140    ?Potassium 3.5 - 5.1 mmol/L 3.9   4.3   3.8    ?Chloride 98 - 111 mmol/L 107   101   103    ?CO2 22 - 32 mmol/L 24   20   23     ?Calcium 8.9 - 10.3 mg/dL 8.2   9.6   9.3    ?Total Protein 6.0 - 8.5 g/dL  7.3     ?Total Bilirubin 0.0 - 1.2 mg/dL  0.2     ?Alkaline Phos 44 - 121 IU/L  92     ?AST 0 - 40 IU/L  20     ?ALT 0 - 32 IU/L  20     ? ?Lipid Panel  ?No results found for: CHOL, TRIG, HDL, CHOLHDL, VLDL, LDLCALC, LDLDIRECT ? ?CBC ?   ?Component Value Date/Time  ? WBC 11.6 (H) 09/24/2020 1131  ? RBC 3.91 09/24/2020 1131  ? HGB 11.1 (L) 09/24/2020 1131  ? HGB 11.8 04/25/2020 1432  ? HCT 33.0 (L) 09/24/2020 1131  ? HCT 36.0 04/25/2020 1432  ? PLT 256 09/24/2020 1131  ? PLT 299 04/25/2020 1432  ?  MCV 84.4 09/24/2020 1131  ? MCV 83 04/25/2020 1432  ? MCH 28.4 09/24/2020 1131  ? MCHC 33.6 09/24/2020 1131  ?  RDW 12.9 09/24/2020 1131  ? RDW 13.4 04/25/2020 1432  ? LYMPHSABS 2.7 09/24/2020 1131  ? LYMPHSABS 3.3 (H) 04/25/2020 1432  ? MONOABS 0.8 09/24/2020 1131  ? EOSABS 0.2 09/24/2020 1131  ? EOSABS 0.2 04/25/2020 1432  ? BASOSABS 0.1 09/24/2020 1131  ? BASOSABS 0.1 04/25/2020 1432  ? ? ?ASSESSMENT AND PLAN: ?1. Left foot pain ?Questionable etiology. ?Advised that for the next 2 weeks she can continue to soak the foot in warm water with Epsom salt.  I will try her with Voltaren gel to use daily over the same time.  Follow-up in several weeks to see if any improvement.  If no improvement we will image the foot. ?- diclofenac Sodium (VOLTAREN) 1 % GEL; Apply 2 g topically 4 (four) times daily.  Dispense: 100 g; Refill: 0 ? ?2. Facial pain ?No signs or symptoms to suggest sinus infection at this time.  Recommend using Tylenol as needed ? ?3. Elevated blood pressure reading without diagnosis of hypertension ?DASH diet discussed and encouraged.  We will recheck blood pressure on subsequent visit ? ? ?[employer] Language interpreter used during this encounter. #[id number] Vicente Serene ? ?Patient was given the opportunity to ask questions.  Patient verbalized understanding of the plan and was able to repeat key elements of the plan.  ? ?This documentation was completed using Paediatric nurse.  Any transcriptional errors are unintentional. ? ?No orders of the defined types were placed in this encounter. ? ? ? ?Requested Prescriptions  ? ?Signed Prescriptions Disp Refills  ? diclofenac Sodium (VOLTAREN) 1 % GEL 100 g 0  ?  Sig: Apply 2 g topically 4 (four) times daily.  ? ? ?Return in about 7 weeks (around 06/12/2021) for f/u foot pain and recheck BP. ? ?Jonah Blue, MD, FACP ?

## 2021-04-25 ENCOUNTER — Other Ambulatory Visit: Payer: Self-pay

## 2021-04-25 MED FILL — Naproxen Tab 500 MG: ORAL | 30 days supply | Qty: 60 | Fill #0 | Status: AC

## 2021-04-25 MED FILL — Methocarbamol Tab 500 MG: ORAL | 10 days supply | Qty: 60 | Fill #0 | Status: AC

## 2021-05-15 ENCOUNTER — Ambulatory Visit: Payer: Self-pay

## 2021-05-15 ENCOUNTER — Other Ambulatory Visit: Payer: Self-pay

## 2021-05-15 ENCOUNTER — Ambulatory Visit: Payer: Self-pay | Admitting: *Deleted

## 2021-05-15 VITALS — BP 128/76 | Wt 210.4 lb

## 2021-05-15 DIAGNOSIS — N644 Mastodynia: Secondary | ICD-10-CM

## 2021-05-15 DIAGNOSIS — Z1239 Encounter for other screening for malignant neoplasm of breast: Secondary | ICD-10-CM

## 2021-05-15 NOTE — Progress Notes (Signed)
Ms. Katherine [name] is a 46 y.o. female who [facility] today with complaint of left outer breast pain and lump x one year. Patient states the pain comes and goes. Patient rates the pain at a 9 out of 10.  ?  ?Pap Smear: Pap smear not completed today. Last Pap smear was 07/10/2019 at [facility] and was normal. Per patient has no history of an abnormal Pap smear. Last Pap smear result is available in Epic. ?  ?Physical exam: ?Breasts ?Breasts symmetrical. No skin abnormalities bilateral breasts. No nipple retraction bilateral breasts. No nipple discharge bilateral breasts. No lymphadenopathy. No lumps palpated bilateral breasts. Complaints of left outer breast pain on exam.    ?  ?Pelvic/Bimanual ?Pap is not indicated today per BCCCP guidelines. ?  ?Smoking History: ?Patient has never smoked. ?  ?Patient Navigation: ?Patient education provided. Access to services provided for patient through [place] program. Spanish interpreter Edwinna Areola from [facility] provided.  ?  ?Breast and Cervical Cancer Risk Assessment: ?Patient does not have family history of breast cancer, known genetic mutations, or radiation treatment to the chest before age 54. Patient does not have history of cervical dysplasia, immunocompromised, or DES exposure in-utero. ? ?Risk Assessment   ? ? Risk Scores   ? ?   05/15/2021  ? Last edited by: Royston Bake, CMA  ? 5-year risk: 0.4 %  ? Lifetime risk: 5 %  ? ?  ?  ? ?  ? ? ?A: ?BCCCP exam without pap smear ?Complaint of left outer breast pain. ? ?P: ?Referred patient to the [facility] for a diagnostic mammogram. Appointment scheduled Tuesday, Jun 03, 2021 at [place]. ? ?Loletta Parish, RN ?05/15/2021 1:22 PM   ?

## 2021-05-15 NOTE — Patient Instructions (Signed)
Explained breast self awareness with [name]. Patient did not need a Pap smear today due to last Pap smear was 07/10/2019. Let her know BCCCP will cover Pap smears every 3 years unless has a history of abnormal Pap smears. [facility]erred [facility] to the [facility] for a diagnostic mammogram. Appointment scheduled Tuesday, Jun 03, 2021 at 0940. Patient aware of appointment and will be there. [name] verbalized understanding. ? ?Kobie Whidby, Kathaleen Maser, RN ?1:22 PM ? ? ? ? ?

## 2021-06-03 ENCOUNTER — Ambulatory Visit: Payer: Self-pay

## 2021-06-03 ENCOUNTER — Ambulatory Visit
Admission: RE | Admit: 2021-06-03 | Discharge: 2021-06-03 | Disposition: A | Discharge status: Home / Self Care | Payer: Self-pay | Source: Ambulatory Visit | Attending: Obstetrics and Gynecology | Admitting: Obstetrics and Gynecology

## 2021-06-03 DIAGNOSIS — N644 Mastodynia: Secondary | ICD-10-CM

## 2021-07-29 ENCOUNTER — Other Ambulatory Visit: Payer: Self-pay

## 2021-07-29 ENCOUNTER — Encounter (HOSPITAL_COMMUNITY): Payer: Self-pay

## 2021-07-29 ENCOUNTER — Emergency Department (HOSPITAL_COMMUNITY)
Admission: EM | Admit: 2021-07-29 | Discharge: 2021-07-29 | Disposition: A | Discharge status: Home / Self Care | Payer: No Typology Code available for payment source | Attending: Emergency Medicine | Admitting: Emergency Medicine

## 2021-07-29 ENCOUNTER — Emergency Department (HOSPITAL_COMMUNITY): Payer: No Typology Code available for payment source

## 2021-07-29 DIAGNOSIS — M546 Pain in thoracic spine: Secondary | ICD-10-CM

## 2021-07-29 MED ORDER — ACETAMINOPHEN 500 MG PO TABS
1000.0000 mg | ORAL_TABLET | Freq: Once | ORAL | Status: AC
  Administered 2021-07-29: 1000 mg via ORAL
  Filled 2021-07-29: qty 2

## 2021-07-29 MED ORDER — METHOCARBAMOL 500 MG PO TABS
500.0000 mg | ORAL_TABLET | Freq: Two times a day (BID) | ORAL | 0 refills | Status: DC
  Filled 2021-07-29 – 2021-08-07 (×2): qty 20, 10d supply, fill #0

## 2021-07-29 MED ORDER — KETOROLAC TROMETHAMINE 15 MG/ML IJ SOLN
15.0000 mg | Freq: Once | INTRAMUSCULAR | Status: AC
  Administered 2021-07-29: 15 mg via INTRAMUSCULAR
  Filled 2021-07-29: qty 1

## 2021-07-29 MED ORDER — OXYCODONE HCL 5 MG PO TABS
5.0000 mg | ORAL_TABLET | Freq: Once | ORAL | Status: AC
  Administered 2021-07-29: 5 mg via ORAL
  Filled 2021-07-29: qty 1

## 2021-07-29 MED ORDER — DIAZEPAM 2 MG PO TABS
2.0000 mg | ORAL_TABLET | Freq: Once | ORAL | Status: AC
  Administered 2021-07-29: 2 mg via ORAL
  Filled 2021-07-29: qty 1

## 2021-07-29 NOTE — ED Provider Notes (Signed)
[facility] EMERGENCY DEPARTMENT Provider Note   CSN: [id number] Arrival date & time: 07/29/21  1908     History  Chief Complaint  Patient presents with   Back Pain    Katherine [name] is a 46 y.o. female.  46 yo F with a chief complaints of left thoracic back pain.  Has been going on for couple days now.  She has been seeing a friend who has performed cupping and massage without significant improvement.  Denies trauma to the area.  Has been taking Tylenol and ibuprofen without much improvement.  Has some pain with deep breathing.  Denies cough congestion or fever.  Started shortly after having constipation.  Had some abdominal pain at that time but none currently.   Back Pain      Home Medications Prior to Admission medications   Medication Sig Start Date End Date Taking? Authorizing Provider  methocarbamol (ROBAXIN) 500 MG tablet Take 1 tablet (500 mg total) by mouth 2 (two) times daily. 07/29/21  Yes Melene Plan, DO  clindamycin (CLEOCIN) 150 MG capsule Take 1 capsule (150 mg total) by mouth every 6 (six) hours. Patient not taking: Reported on 04/24/2021 09/24/20   Fayrene Helper, PA-C  diclofenac Sodium (VOLTAREN) 1 % GEL Apply 2 g topically 4 (four) times daily. Patient not taking: Reported on 05/15/2021 04/24/21   Marcine Matar, MD  fluticasone [facility]) 50 MCG/ACT nasal spray PLACE 2 SPRAYS INTO BOTH NOSTRILS DAILY AS NEEDED 11/09/19 11/28/20  Fulp, Hewitt Shorts, MD  ibuprofen (ADVIL) 600 MG tablet Take 1 tablet (600 mg total) by mouth every 6 (six) hours as needed. Patient not taking: Reported on 04/24/2021 09/24/20   Fayrene Helper, PA-C  UNKNOWN TO PATIENT Patient takes some type of pain medication for muscle pain but does not know what kind.    [provider]  valACYclovir (VALTREX) 1000 MG tablet Take 1 tablet (1,000 mg total) by mouth 3 (three) times daily. At the first sign of herpes outbreak Patient not taking: Reported on 04/24/2021 05/03/19    Storm Frisk, MD      Allergies    Patient has no known allergies.    Review of Systems   Review of Systems  Musculoskeletal:  Positive for back pain.    Physical Exam Updated Vital Signs BP (!) 143/99 (BP Location: Right Arm)   Pulse 66   Temp 98.2 F (36.8 C) (Oral)   Resp 18   Ht 5\' 2"  (1.575 m)   Wt 99.8 kg   LMP 06/30/2021   SpO2 97%   BMI 40.24 kg/m  Physical Exam Vitals and nursing note reviewed.  Constitutional:      General: She is not in acute distress.    Appearance: She is well-developed. She is not diaphoretic.  HENT:     Head: Normocephalic and atraumatic.  Eyes:     Pupils: Pupils are equal, round, and reactive to light.  Cardiovascular:     Rate and Rhythm: Normal rate and regular rhythm.     Heart sounds: No murmur heard.    No friction rub. No gallop.  Pulmonary:     Effort: Pulmonary effort is normal.     Breath sounds: No wheezing or rales.  Abdominal:     General: There is no distension.     Palpations: Abdomen is soft.     Tenderness: There is no abdominal tenderness.  Musculoskeletal:        General: Tenderness present.     Cervical  back: Normal range of motion and neck supple.     Comments: Pain noted between the tip of the scapula and the thoracic spine.  No obvious midline pain step-offs or deformities.  Multiple ring areas with some breakdown of the skin.  Skin:    General: Skin is warm and dry.  Neurological:     Mental Status: She is alert and oriented to person, place, and time.  Psychiatric:        Behavior: Behavior normal.     ED Results / Procedures / Treatments   Labs (all labs ordered are listed, but only abnormal results are displayed) Labs Reviewed - No data to display  EKG None  Radiology DG Chest [facility] 1 View  Result Date: 07/29/2021 CLINICAL DATA:  left upper back pain EXAM: PORTABLE CHEST 1 VIEW COMPARISON:  Chest x-ray 01/06/2018, chest x-ray 06/20/2016, CT chest 02/24/2018 FINDINGS: Enlarged cardiac  silhouette. The heart and mediastinal contours are unchanged. No focal consolidation. No pulmonary edema. No pleural effusion. No pneumothorax. No acute osseous abnormality. IMPRESSION: Cardiomegaly with otherwise no acute cardiopulmonary abnormality. Electronically Signed   By: Tish Frederickson M.D.   On: 07/29/2021 20:10    Procedures Procedures    Medications Ordered in ED Medications  acetaminophen (TYLENOL) tablet 1,000 mg (has no administration in time range)  oxyCODONE (Oxy IR/ROXICODONE) immediate release tablet 5 mg (has no administration in time range)  diazepam (VALIUM) tablet 2 mg (has no administration in time range)  ketorolac (TORADOL) 15 MG/ML injection 15 mg (has no administration in time range)    ED Course/ Medical Decision Making/ A&P                           Medical Decision Making Amount and/or Complexity of Data Reviewed Radiology: ordered.  Risk OTC drugs. Prescription drug management.   46 yo F with a chief complaint of left thoracic back pain.  This been going on for about 3 days now.  Atraumatic.  Focal pain worse with movement and palpation.  Likely musculoskeletal.  We will obtain a screening chest x-ray.  Chest x-ray independently turbid by me without focal infiltrate or pneumothorax.  8:14 PM:  I have discussed the diagnosis/risks/treatment options with the patient.  Evaluation and diagnostic testing in the emergency department does not suggest an emergent condition requiring admission or immediate intervention beyond what has been performed at this time.  They will follow up with  PCP. We also discussed returning to the ED immediately if new or worsening sx occur. We discussed the sx which are most concerning (e.g., sudden worsening pain, fever, inability to tolerate by mouth) that necessitate immediate return. Medications administered to the patient during their visit and any new prescriptions provided to the patient are listed below.  Medications  given during this visit Medications  acetaminophen (TYLENOL) tablet 1,000 mg (has no administration in time range)  oxyCODONE (Oxy IR/ROXICODONE) immediate release tablet 5 mg (has no administration in time range)  diazepam (VALIUM) tablet 2 mg (has no administration in time range)  ketorolac (TORADOL) 15 MG/ML injection 15 mg (has no administration in time range)     The patient appears reasonably screen and/or stabilized for discharge and I doubt any other medical condition or other [facility] requiring further screening, evaluation, or treatment in the ED at this time prior to discharge.          Final Clinical Impression(s) / ED Diagnoses Final diagnoses:  Acute left-sided  thoracic back pain    Rx / DC Orders ED Discharge Orders          Ordered    methocarbamol (ROBAXIN) 500 MG tablet  2 times daily        07/29/21 2013              Melene Plan, DO 07/29/21 2014

## 2021-07-29 NOTE — ED Notes (Signed)
Patient verbalizes understanding of d/c instructions. Opportunities for questions and answers were provided. Pt d/c from ED and ambulated to lobby with family.  

## 2021-07-29 NOTE — ED Triage Notes (Signed)
Spanish translator used for triage. Pt to ED from home. Pt c/o mid upper back pain x 4 days. Pt denies injury. Pt states she has been constipated and has had lower abdominal pain. Pt denies bowel/bladder incontinence, has had intermittent numbness in back and left arm. Denies any other numbness/tingling.  Pt ambulatory to triage, appears to be in pain. Denies previous injuries.

## 2021-07-29 NOTE — Discharge Instructions (Addendum)

## 2021-07-30 ENCOUNTER — Other Ambulatory Visit: Payer: Self-pay

## 2021-08-05 ENCOUNTER — Other Ambulatory Visit: Payer: Self-pay

## 2021-08-07 ENCOUNTER — Ambulatory Visit: Payer: Self-pay | Attending: Internal Medicine | Admitting: Internal Medicine

## 2021-08-07 ENCOUNTER — Encounter: Payer: Self-pay | Admitting: Internal Medicine

## 2021-08-07 ENCOUNTER — Other Ambulatory Visit: Payer: Self-pay

## 2021-08-07 VITALS — BP 110/69 | HR 70 | Temp 98.1°F | Ht 62.0 in | Wt 217.6 lb

## 2021-08-07 DIAGNOSIS — M79672 Pain in left foot: Secondary | ICD-10-CM

## 2021-08-07 MED ORDER — NAPROXEN 500 MG PO TABS
500.0000 mg | ORAL_TABLET | Freq: Two times a day (BID) | ORAL | 0 refills | Status: DC
  Filled 2021-08-07: qty 20, 10d supply, fill #0

## 2021-08-07 NOTE — Progress Notes (Signed)
Patient ID: Katherine [name], female    DOB: 10-07-1975  MRN: [id number]  CC: Foot Pain   Subjective: Katherine [name] is a 46 y.o. female who presents for UC visit - LT foot pain Her concerns today include:   Pt seen 03/2021 for pain in LT foot that was going on for 2 mths at that time.   Did well with Voltaren Gel but started having pain again 2 wks ago. Pain over lateral midfoot. No initiating factors. Pain comes and goes. Worse with walking for long periods.  Patient Active Problem List   Diagnosis Date Noted   History of COVID-19 05/03/2019   Pelvic pain 05/03/2019   Herpes zoster without complication 05/03/2019   Chronic bilateral low back pain without sciatica 05/03/2019   Ventral hernia without obstruction or gangrene    Spleen enlarged 07/31/2013   History of anemia 07/31/2013   Dental caries 09/06/2012     Current Outpatient Medications on File Prior to Visit  Medication Sig Dispense Refill   ibuprofen (ADVIL) 600 MG tablet Take 1 tablet (600 mg total) by mouth every 6 (six) hours as needed. 30 tablet 0   methocarbamol (ROBAXIN) 500 MG tablet Take 1 tablet (500 mg total) by mouth 2 (two) times daily. 20 tablet 0   clindamycin (CLEOCIN) 150 MG capsule Take 1 capsule (150 mg total) by mouth every 6 (six) hours. (Patient not taking: Reported on 04/24/2021) 28 capsule 0   diclofenac Sodium (VOLTAREN) 1 % GEL Apply 2 g topically 4 (four) times daily. (Patient not taking: Reported on 05/15/2021) 100 g 0   fluticasone (FLONASE) 50 MCG/ACT nasal spray PLACE 2 SPRAYS INTO BOTH NOSTRILS DAILY AS NEEDED 16 g 3   UNKNOWN TO PATIENT Patient takes some type of pain medication for muscle pain but does not know what kind. (Patient not taking: Reported on 08/07/2021)     valACYclovir (VALTREX) 1000 MG tablet Take 1 tablet (1,000 mg total) by mouth 3 (three) times daily. At the first sign of herpes outbreak (Patient not taking: Reported on 04/24/2021) 21 tablet 1   No current  facility-administered medications on file prior to visit.    No Known Allergies  Social History   Socioeconomic History   Marital status: Single    Spouse name: Not on file   Number of children: 5   Years of education: Not on file   Highest education level: Not on file  Occupational History   Not on file  Tobacco Use   Smoking status: Some Days    Types: Cigarettes   Smokeless tobacco: Never  Vaping Use   Vaping Use: Never used  Substance and Sexual Activity   Alcohol use: Yes   Drug use: No   Sexual activity: Yes    Birth control/protection: Surgical  Other Topics Concern   Not on file  Social History Narrative   Not on file   Social Determinants of Health   Financial Resource Strain: Not on file  Food Insecurity: No Food Insecurity (05/15/2021)   Hunger Vital Sign    Worried About Running Out of Food in the Last Year: Never true    Ran Out of Food in the Last Year: Never true  Transportation Needs: No Transportation Needs (05/15/2021)   PRAPARE - Administrator, Civil Service (Medical): No    Lack of Transportation (Non-Medical): No  Physical Activity: Not on file  Stress: Not on file  Social Connections: Not on file  Intimate Partner Violence:  Not on file    Family History  Problem Relation Age of Onset   Diabetes Father    Kidney disease Brother     Past Surgical History:  Procedure Laterality Date   nexplanon     NO PAST SURGERIES     ROBOTIC ASSISTED LAPAROSCOPIC VENTRAL/INCISIONAL HERNIA REPAIR N/A 01/04/2018   Procedure: ROBOTIC ASSISTED LAPAROSCOPIC VENTRAL/INCISIONAL HERNIA REPAIR;  Surgeon: Leafy Ro, MD;  Location: [facility] ORS;  Service: General;  Laterality: N/A;   ROBOTIC ASSISTED SALPINGO OOPHERECTOMY Bilateral 01/04/2018   Procedure: ROBOTIC ASSISTED SALPINGECTOMY;  Surgeon: Vena Austria, MD;  Location: [facility] ORS;  Service: Gynecology;  Laterality: Bilateral;   TUBAL LIGATION     UPPER GI ENDOSCOPY  2015    ROS: Review  of Systems Negative except as stated above  PHYSICAL EXAM: BP 110/69   Pulse 70   Temp 98.1 F (36.7 C) (Oral)   Ht 5\' 2"  (1.575 m)   Wt 217 lb 9.6 oz (98.7 kg)   LMP 06/30/2021   SpO2 98%   BMI 39.80 kg/m   Physical Exam  General appearance - alert, well appearing, middle-age obese Hispanic female and in no distress Mental status - normal mood, behavior, speech, dress, motor activity, and thought processes Musculoskeletal -left foot: No edema or erythema.  She has mild soft tissue swelling anterior to the lateral malleolus.  This area is mildly tender to touch.  She has discomfort with flexion and extension of the ankle.      Latest Ref Rng & Units 09/24/2020   11:31 AM 04/25/2020    2:32 PM 11/09/2019    3:28 PM  CMP  Glucose 70 - 99 mg/dL 94  90  83   BUN 6 - 20 mg/dL 7  11  12    Creatinine 0.44 - 1.00 mg/dL 11/11/2019   6.07   Sodium 135 - 145 mmol/L 138  138  140   Potassium 3.5 - 5.1 mmol/L 3.9  4.3  3.8   Chloride 98 - 111 mmol/L 107  101  103   CO2 22 - 32 mmol/L 24  20  23    Calcium 8.9 - 10.3 mg/dL 8.2  9.6  9.3   Total Protein 6.0 - 8.5 g/dL  7.3    Total Bilirubin 0.0 - 1.2 mg/dL  0.2    Alkaline Phos 44 - 121 IU/L  92    AST 0 - 40 IU/L  20    ALT 0 - 32 IU/L  20     Lipid Panel  No results found for: "CHOL", "TRIG", "HDL", "CHOLHDL", "VLDL", "LDLCALC", "LDLDIRECT"  CBC    Component Value Date/Time   WBC 11.6 (H) 09/24/2020 1131   RBC 3.91 09/24/2020 1131   HGB 11.1 (L) 09/24/2020 1131   HGB 11.8 04/25/2020 1432   HCT 33.0 (L) 09/24/2020 1131   HCT 36.0 04/25/2020 1432   PLT 256 09/24/2020 1131   PLT 299 04/25/2020 1432   MCV 84.4 09/24/2020 1131   MCV 83 04/25/2020 1432   MCH 28.4 09/24/2020 1131   MCHC 33.6 09/24/2020 1131   RDW 12.9 09/24/2020 1131   RDW 13.4 04/25/2020 1432   LYMPHSABS 2.7 09/24/2020 1131   LYMPHSABS 3.3 (H) 04/25/2020 1432   MONOABS 0.8 09/24/2020 1131   EOSABS 0.2 09/24/2020 1131   EOSABS 0.2 04/25/2020 1432   BASOSABS  0.1 09/24/2020 1131   BASOSABS 0.1 04/25/2020 1432    ASSESSMENT AND PLAN: 1. Left foot pain MSK in nature -Rec  soaking foot in warm water once a day for 10 mins x 1 wk Naprosyn twice a day x 10 days Wear comfortable shoes. - naproxen (NAPROSYN) 500 MG tablet; Take 1 tablet (500 mg total) by mouth 2 (two) times daily with a meal. Take with food  Dispense: 20 tablet; Refill: 0    [employer] Language interpreter used during this encounter. [id number] Griffins  Patient was given the opportunity to ask questions.  Patient verbalized understanding of the plan and was able to repeat key elements of the plan.   This documentation was completed using Radio producer.  Any transcriptional errors are unintentional.  No orders of the defined types were placed in this encounter.    Requested Prescriptions   Signed Prescriptions Disp Refills   naproxen (NAPROSYN) 500 MG tablet 20 tablet 0    Sig: Take 1 tablet (500 mg total) by mouth 2 (two) times daily with a meal. Take with food    No follow-ups on file.  Karle Plumber, MD, FACP

## 2021-08-07 NOTE — Progress Notes (Signed)
Pain in left foot. 

## 2021-08-13 ENCOUNTER — Encounter: Payer: Self-pay | Admitting: Internal Medicine

## 2021-08-14 ENCOUNTER — Other Ambulatory Visit: Payer: Self-pay

## 2021-08-14 ENCOUNTER — Other Ambulatory Visit: Payer: Self-pay | Admitting: Internal Medicine

## 2021-08-14 DIAGNOSIS — M79672 Pain in left foot: Secondary | ICD-10-CM

## 2021-09-01 ENCOUNTER — Ambulatory Visit (INDEPENDENT_AMBULATORY_CARE_PROVIDER_SITE_OTHER): Payer: No Typology Code available for payment source | Admitting: Podiatry

## 2021-09-01 ENCOUNTER — Other Ambulatory Visit: Payer: Self-pay

## 2021-09-01 ENCOUNTER — Other Ambulatory Visit: Payer: Self-pay | Admitting: Internal Medicine

## 2021-09-01 ENCOUNTER — Ambulatory Visit (INDEPENDENT_AMBULATORY_CARE_PROVIDER_SITE_OTHER): Payer: No Typology Code available for payment source

## 2021-09-01 DIAGNOSIS — M79672 Pain in left foot: Secondary | ICD-10-CM

## 2021-09-01 DIAGNOSIS — M778 Other enthesopathies, not elsewhere classified: Secondary | ICD-10-CM

## 2021-09-01 NOTE — Progress Notes (Signed)
   Chief Complaint  Patient presents with   Foot Pain    Left foot pain    HPI: 46 y.o. female presenting today as a new patient for evaluation of left foot and ankle pain has been going on for about 6 months now.  Patient cleans homes for work and she has noticed chronic increased pain and tenderness.  She denies a history of injury.  She has not done anything for treatment.  She presents for further treatment and evaluation  Past Medical History:  Diagnosis Date   Abdominal pain    Anemia    Depression    postpartum depression   Morbid obesity (HCC)    Splenomegaly     Past Surgical History:  Procedure Laterality Date   nexplanon     NO PAST SURGERIES     ROBOTIC ASSISTED LAPAROSCOPIC VENTRAL/INCISIONAL HERNIA REPAIR N/A 01/04/2018   Procedure: ROBOTIC ASSISTED LAPAROSCOPIC VENTRAL/INCISIONAL HERNIA REPAIR;  Surgeon: Leafy Ro, MD;  Location: [facility] ORS;  Service: General;  Laterality: N/A;   ROBOTIC ASSISTED SALPINGO OOPHERECTOMY Bilateral 01/04/2018   Procedure: ROBOTIC ASSISTED SALPINGECTOMY;  Surgeon: Vena Austria, MD;  Location: [facility] ORS;  Service: Gynecology;  Laterality: Bilateral;   TUBAL LIGATION     UPPER GI ENDOSCOPY  2015    No Known Allergies   Physical Exam: General: The patient is alert and oriented x3 in no acute distress.  Dermatology: Skin is warm, dry and supple bilateral lower extremities. Negative for open lesions or macerations.  Vascular: Palpable pedal pulses bilaterally. Capillary refill within normal limits.  Negative for any significant edema or erythema  Neurological: Light touch and protective threshold grossly intact  Musculoskeletal Exam: No pedal deformities noted.  Today there is pain on palpation throughout the lateral aspect of the ankle extending to the TMT joint.  Radiographic Exam:  Normal osseous mineralization. Joint spaces preserved. No fracture/dislocation/boney destruction.    Assessment: 1.  Capsulitis left foot and  ankle lateral aspect   Plan of Care:  1. Patient evaluated. X-Rays reviewed.  2.  Injection of 0.5 cc Celestone Soluspan injected along the lateral aspect of the foot around the fourth TMT joint 3.  Prescription for Medrol Dosepak 4.  Prescription for meloxicam 15 mg daily 5.  Cam boot dispensed.  Wear daily 6.  Return to clinic in 3 weeks      Felecia Shelling, DPM [facility]  Dr. Felecia Shelling, DPM    [address] [place], [place] [address]                Office [phone]  Fax [phone]

## 2021-09-02 NOTE — Telephone Encounter (Signed)
Requested medication (s) are due for refill today: yes  Requested medication (s) are on the active medication list: yes  Last refill:  08/07/21 #20 with 0 RF  Future visit scheduled: no, just seen 08/07/21  Notes to clinic:  This may have been an automated request. Pt saw Orthopedic yesterday, received injection in foot, Medrol pack, and Meloxicam. Please assess.      Requested Prescriptions  Pending Prescriptions Disp Refills   naproxen (NAPROSYN) 500 MG tablet 20 tablet 0    Sig: Take 1 tablet (500 mg total) by mouth 2 (two) times daily with a meal. Take with food     Analgesics:  NSAIDS Failed - 09/01/2021  2:35 PM      Failed - Manual Review: Labs are only required if the patient has taken medication for more than 8 weeks.      Failed - HGB in normal range and within 360 days    Hemoglobin  Date Value Ref Range Status  09/24/2020 11.1 (L) 12.0 - 15.0 g/dL Final  04/25/2020 11.8 11.1 - 15.9 g/dL Final         Failed - HCT in normal range and within 360 days    HCT  Date Value Ref Range Status  09/24/2020 33.0 (L) 36.0 - 46.0 % Final   Hematocrit  Date Value Ref Range Status  04/25/2020 36.0 34.0 - 46.6 % Final         Passed - Cr in normal range and within 360 days    Creat  Date Value Ref Range Status  07/31/2013 0.60 0.50 - 1.10 mg/dL Final   Creatinine, Ser  Date Value Ref Range Status  09/24/2020 0.50 0.44 - 1.00 mg/dL Final         Passed - PLT in normal range and within 360 days    Platelets  Date Value Ref Range Status  09/24/2020 256 150 - 400 K/uL Final  04/25/2020 299 150 - 450 x10E3/uL Final         Passed - eGFR is 30 or above and within 360 days    GFR, Est African American  Date Value Ref Range Status  07/31/2013 >89 mL/min Final   GFR calc Af Amer  Date Value Ref Range Status  11/09/2019 129 >59 mL/min/1.73 Final    Comment:    **In accordance with recommendations from the NKF-ASN Task force,**   [facility] is in the process of updating its  eGFR calculation to the   2021 CKD-EPI creatinine equation that estimates kidney function   without a race variable.    GFR, Est Non African American  Date Value Ref Range Status  07/31/2013 >89 mL/min Final    Comment:      The estimated GFR is a calculation valid for adults (>=71 years old) that uses the CKD-EPI algorithm to adjust for age and sex. It is   not to be used for children, pregnant women, hospitalized patients,    patients on dialysis, or with rapidly changing kidney function. According to the NKDEP, eGFR >89 is normal, 60-89 shows mild impairment, 30-59 shows moderate impairment, 15-29 shows severe impairment and <15 is ESRD.     GFR, Estimated  Date Value Ref Range Status  09/24/2020 >60 >60 mL/min Final    Comment:    (NOTE) Calculated using the CKD-EPI Creatinine Equation (2021)    eGFR  Date Value Ref Range Status  04/25/2020 115 >59 mL/min/1.73 Final         Passed -  Patient is not pregnant      Passed - Valid encounter within last 12 months    Recent Outpatient Visits           3 weeks ago Left foot pain   [place], [place]   4 months ago Left foot pain   [place], [place]   11 months ago Apical abscess   [place], [place]   1 year ago Chest pain, unspecified type   [place], [place]   1 year ago Chronic pain of left knee   [place], [place], [place]

## 2021-09-03 ENCOUNTER — Other Ambulatory Visit: Payer: Self-pay

## 2021-09-03 MED ORDER — NAPROXEN 500 MG PO TABS
500.0000 mg | ORAL_TABLET | Freq: Two times a day (BID) | ORAL | 0 refills | Status: DC
  Filled 2021-09-03 – 2021-10-22 (×4): qty 20, 10d supply, fill #0

## 2021-09-09 ENCOUNTER — Other Ambulatory Visit: Payer: Self-pay

## 2021-09-24 ENCOUNTER — Ambulatory Visit: Payer: No Typology Code available for payment source | Admitting: Podiatry

## 2021-10-07 ENCOUNTER — Other Ambulatory Visit (HOSPITAL_COMMUNITY): Payer: Self-pay

## 2021-10-08 ENCOUNTER — Other Ambulatory Visit (HOSPITAL_COMMUNITY): Payer: Self-pay

## 2021-10-20 ENCOUNTER — Other Ambulatory Visit (HOSPITAL_COMMUNITY): Payer: Self-pay

## 2021-10-21 ENCOUNTER — Other Ambulatory Visit (HOSPITAL_COMMUNITY): Payer: Self-pay

## 2021-10-22 ENCOUNTER — Other Ambulatory Visit: Payer: Self-pay

## 2021-10-22 ENCOUNTER — Other Ambulatory Visit (HOSPITAL_COMMUNITY): Payer: Self-pay

## 2021-10-23 ENCOUNTER — Other Ambulatory Visit: Payer: Self-pay

## 2021-10-30 ENCOUNTER — Other Ambulatory Visit: Payer: Self-pay

## 2021-11-05 ENCOUNTER — Ambulatory Visit: Payer: No Typology Code available for payment source | Admitting: Podiatry

## 2021-11-14 ENCOUNTER — Ambulatory Visit: Payer: Self-pay | Attending: Internal Medicine | Admitting: Internal Medicine

## 2021-11-14 ENCOUNTER — Other Ambulatory Visit: Payer: Self-pay

## 2021-11-14 ENCOUNTER — Encounter: Payer: Self-pay | Admitting: Internal Medicine

## 2021-11-14 VITALS — BP 132/77 | HR 61 | Temp 98.0°F | Ht 62.0 in | Wt 214.2 lb

## 2021-11-14 DIAGNOSIS — M546 Pain in thoracic spine: Secondary | ICD-10-CM

## 2021-11-14 DIAGNOSIS — Z23 Encounter for immunization: Secondary | ICD-10-CM

## 2021-11-14 MED ORDER — CYCLOBENZAPRINE HCL 5 MG PO TABS
5.0000 mg | ORAL_TABLET | Freq: Every day | ORAL | 0 refills | Status: DC | PRN
  Filled 2021-11-14: qty 20, 20d supply, fill #0

## 2021-11-14 MED ORDER — IBUPROFEN 600 MG PO TABS
600.0000 mg | ORAL_TABLET | Freq: Four times a day (QID) | ORAL | 0 refills | Status: DC | PRN
  Filled 2021-11-14: qty 30, 8d supply, fill #0

## 2021-11-14 NOTE — Progress Notes (Signed)
Patient ID: Katherine [name], female    DOB: [birth year]  MRN: [id number]  CC: Back Pain   Subjective: Katherine [name] is a 46 y.o. female who presents for UC  Her concerns today include:   Pt c/o mid thoracic back pain that started 3 wks ago.  Attributes to being accidentally hit by a large empty plastic trash can at her daughter's place of work.  Someone was putting it back in its place, person did not see her.  He throw the can and it hit her in the mid back below the bra -pain intermittent and last about 10 mins when it comes on; comes suddenly during the day. [employer] in [employer], [employer].    When it comes on, it makes her feel agitated even to speak.  Pain is across the mid thoracic back Takes Naprosyn which helps but ran out so started taking Tylenol.  She was taking Naprosyn Q4-6 hrs.   Patient Active Problem List   Diagnosis Date Noted   History of COVID-19 05/03/2019   Pelvic pain 05/03/2019   Herpes zoster without complication 05/03/2019   Chronic bilateral low back pain without sciatica 05/03/2019   Ventral hernia without obstruction or gangrene    Spleen enlarged 07/31/2013   History of anemia 07/31/2013   Dental caries 09/06/2012     Current Outpatient Medications on File Prior to Visit  Medication Sig Dispense Refill   methocarbamol (ROBAXIN) 500 MG tablet Take 1 tablet (500 mg total) by mouth 2 (two) times daily. 20 tablet 0   clindamycin (CLEOCIN) 150 MG capsule Take 1 capsule (150 mg total) by mouth every 6 (six) hours. (Patient not taking: Reported on 11/14/2021) 28 capsule 0   diclofenac Sodium (VOLTAREN) 1 % GEL Apply 2 g topically 4 (four) times daily. (Patient not taking: Reported on 11/14/2021) 100 g 0   fluticasone (FLONASE) 50 MCG/ACT nasal spray PLACE 2 SPRAYS INTO BOTH NOSTRILS DAILY AS NEEDED 16 g 3   ibuprofen (ADVIL) 600 MG tablet Take 1 tablet (600 mg total) by mouth every 6 (six) hours as needed. (Patient not taking: Reported on 11/14/2021) 30  tablet 0   naproxen (NAPROSYN) 500 MG tablet Take 1 tablet (500 mg total) by mouth 2 (two) times daily with a meal. (Patient not taking: Reported on 11/14/2021) 20 tablet 0   UNKNOWN TO PATIENT Patient takes some type of pain medication for muscle pain but does not know what kind. (Patient not taking: Reported on 11/14/2021)     valACYclovir (VALTREX) 1000 MG tablet Take 1 tablet (1,000 mg total) by mouth 3 (three) times daily. At the first sign of herpes outbreak (Patient not taking: Reported on 11/14/2021) 21 tablet 1   No current facility-administered medications on file prior to visit.    No Known Allergies  Social History   Socioeconomic History   Marital status: Single    Spouse name: Not on file   Number of children: 5   Years of education: Not on file   Highest education level: Not on file  Occupational History   Not on file  Tobacco Use   Smoking status: Some Days    Types: Cigarettes   Smokeless tobacco: Never  Vaping Use   Vaping Use: Never used  Substance and Sexual Activity   Alcohol use: Yes   Drug use: No   Sexual activity: Yes    Birth control/protection: Surgical  Other Topics Concern   Not on file  Social History Narrative   Not on  file   Social Determinants of Health   Financial Resource Strain: Not on file  Food Insecurity: No Food Insecurity (05/15/2021)   Hunger Vital Sign    Worried About Running Out of Food in the Last Year: Never true    Ran Out of Food in the Last Year: Never true  Transportation Needs: No Transportation Needs (05/15/2021)   PRAPARE - Hydrologist (Medical): No    Lack of Transportation (Non-Medical): No  Physical Activity: Not on file  Stress: Not on file  Social Connections: Not on file  Intimate Partner Violence: Not on file    Family History  Problem Relation Age of Onset   Diabetes Father    Kidney disease Brother     Past Surgical History:  Procedure Laterality Date   nexplanon      NO PAST SURGERIES     ROBOTIC ASSISTED LAPAROSCOPIC VENTRAL/INCISIONAL HERNIA REPAIR N/A 01/04/2018   Procedure: ROBOTIC ASSISTED LAPAROSCOPIC [place];  Surgeon: Jules Husbands, MD;  Location: [facility] ORS;  Service: General;  Laterality: N/A;   ROBOTIC ASSISTED SALPINGO OOPHERECTOMY Bilateral 01/04/2018   Procedure: ROBOTIC ASSISTED SALPINGECTOMY;  Surgeon: Malachy Mood, MD;  Location: [facility] OR[facility];  Service: Gynecology;  Laterality: Bilateral;   TUBAL LIGATION     UPPER GI ENDOSCOPY  2015    ROS: Review of Systems Negative except as stated above  PHYSICAL EXAM: BP 132/77   Pulse 61   Temp 98 F (36.7 C) (Oral)   Ht 5\' 2"  (1.575 m)   Wt 214 lb 3.2 oz (97.2 kg)   SpO2 98%   BMI 39.18 kg/m   Physical Exam  General appearance - alert, well appearing, obese middle-age Hispanic female and in no distress Mental status - normal mood, behavior, speech, dress, motor activity, and thought processes Musculoskeletal -mild tenderness on palpation of the mid thoracic spine and surrounding paraspinal muscles.  No tenderness on palpation of the lumbar spine.  Straight leg raise negative.  Gait is normal.     Latest Ref Rng & Units 09/24/2020   11:31 AM 04/25/2020    2:32 PM 11/09/2019    3:28 PM  CMP  Glucose 70 - 99 mg/dL 94  90  83   BUN 6 - 20 mg/dL 7  11  12    Creatinine 0.44 - 1.00 mg/dL 0.50  0.58  0.60   Sodium 135 - 145 mmol/L 138  138  140   Potassium 3.5 - 5.1 mmol/L 3.9  4.3  3.8   Chloride 98 - 111 mmol/L 107  101  103   CO2 22 - 32 mmol/L 24  20  23    Calcium 8.9 - 10.3 mg/dL 8.2  9.6  9.3   Total Protein 6.0 - 8.5 g/dL  7.3    Total Bilirubin 0.0 - 1.2 mg/dL  0.2    Alkaline Phos 44 - 121 IU/L  92    AST 0 - 40 IU/L  20    ALT 0 - 32 IU/L  20     Lipid Panel  No results found for: "CHOL", "TRIG", "HDL", "CHOLHDL", "VLDL", "LDLCALC", "LDLDIRECT"  CBC    Component Value Date/Time   WBC 11.6 (H) 09/24/2020 1131   RBC 3.91 09/24/2020 1131   HGB  11.1 (L) 09/24/2020 1131   HGB 11.8 04/25/2020 1432   HCT 33.0 (L) 09/24/2020 1131   HCT 36.0 04/25/2020 1432   PLT 256 09/24/2020 1131   PLT 299 04/25/2020 1432  MCV 84.4 09/24/2020 1131   MCV 83 04/25/2020 1432   MCH 28.4 09/24/2020 1131   MCHC 33.6 09/24/2020 1131   RDW 12.9 09/24/2020 1131   RDW 13.4 04/25/2020 1432   LYMPHSABS 2.7 09/24/2020 1131   LYMPHSABS 3.3 (H) 04/25/2020 1432   MONOABS 0.8 09/24/2020 1131   EOSABS 0.2 09/24/2020 1131   EOSABS 0.2 04/25/2020 1432   BASOSABS 0.1 09/24/2020 1131   BASOSABS 0.1 04/25/2020 1432    ASSESSMENT AND PLAN: 1. Bilateral thoracic back pain, unspecified chronicity Contusion plus or minus strain to the thoracic paraspinal muscles. Recommend use of a heating pad twice a day for about 15 minutes. Prescription given for Flexeril.  Advised that it is a muscle relaxant can cause drowsiness.  Do not take it when she has to drive or operate any machinery.  Ibuprofen to use as needed. - ibuprofen (ADVIL) 600 MG tablet; Take 1 tablet (600 mg total) by mouth every 6 (six) hours as needed (Take with food).  Dispense: 30 tablet; Refill: 0 - cyclobenzaprine (FLEXERIL) 5 MG tablet; Take 1 tablet (5 mg total) by mouth daily as needed for muscle spasms.  Dispense: 20 tablet; Refill: 0  2. Need for immunization against influenza - Flu Vaccine QUAD 56mo+IM (Fluarix, Fluzone & Alfiuria Quad PF)    [employer] Language interpreter used during this encounter. #[id number], Nallely  Patient was given the opportunity to ask questions.  Patient verbalized understanding of the plan and was able to repeat key elements of the plan.   This documentation was completed using Paediatric nurse.  Any transcriptional errors are unintentional.  No orders of the defined types were placed in this encounter.    Requested Prescriptions    No prescriptions requested or ordered in this encounter    No follow-ups on file.  Jonah Blue, MD, FACP

## 2021-11-19 ENCOUNTER — Other Ambulatory Visit: Payer: Self-pay

## 2021-12-31 ENCOUNTER — Ambulatory Visit: Payer: No Typology Code available for payment source | Admitting: Podiatry

## 2022-02-11 ENCOUNTER — Other Ambulatory Visit: Payer: Self-pay

## 2022-03-23 ENCOUNTER — Emergency Department (HOSPITAL_COMMUNITY): Payer: Self-pay

## 2022-03-23 ENCOUNTER — Observation Stay (HOSPITAL_BASED_OUTPATIENT_CLINIC_OR_DEPARTMENT_OTHER): Payer: Self-pay

## 2022-03-23 ENCOUNTER — Encounter (HOSPITAL_COMMUNITY): Payer: Self-pay

## 2022-03-23 ENCOUNTER — Ambulatory Visit: Payer: No Typology Code available for payment source | Admitting: Podiatry

## 2022-03-23 ENCOUNTER — Ambulatory Visit: Payer: Self-pay

## 2022-03-23 ENCOUNTER — Observation Stay (HOSPITAL_COMMUNITY)
Admission: EM | Admit: 2022-03-23 | Discharge: 2022-03-24 | Disposition: A | Discharge status: Home / Self Care | Payer: Self-pay | Attending: Family Medicine | Admitting: Family Medicine

## 2022-03-23 DIAGNOSIS — Z79899 Other long term (current) drug therapy: Secondary | ICD-10-CM | POA: Insufficient documentation

## 2022-03-23 DIAGNOSIS — R079 Chest pain, unspecified: Secondary | ICD-10-CM

## 2022-03-23 DIAGNOSIS — I5032 Chronic diastolic (congestive) heart failure: Secondary | ICD-10-CM | POA: Insufficient documentation

## 2022-03-23 DIAGNOSIS — E781 Pure hyperglyceridemia: Secondary | ICD-10-CM | POA: Insufficient documentation

## 2022-03-23 DIAGNOSIS — I259 Chronic ischemic heart disease, unspecified: Secondary | ICD-10-CM

## 2022-03-23 DIAGNOSIS — F17210 Nicotine dependence, cigarettes, uncomplicated: Secondary | ICD-10-CM | POA: Insufficient documentation

## 2022-03-23 DIAGNOSIS — R0789 Other chest pain: Principal | ICD-10-CM | POA: Insufficient documentation

## 2022-03-23 DIAGNOSIS — R55 Syncope and collapse: Secondary | ICD-10-CM | POA: Insufficient documentation

## 2022-03-23 LAB — ECHOCARDIOGRAM COMPLETE
Area-P 1/2: 3.31 cm2
Height: 62 in
MV M vel: 1.24 m/s
MV Peak grad: 6.2 mmHg
S' Lateral: 3 cm
Weight: 3418.01 oz

## 2022-03-23 LAB — URINALYSIS, ROUTINE W REFLEX MICROSCOPIC
Bilirubin Urine: NEGATIVE
Glucose, UA: NEGATIVE mg/dL
Hgb urine dipstick: NEGATIVE
Ketones, ur: NEGATIVE mg/dL
Nitrite: NEGATIVE
Protein, ur: NEGATIVE mg/dL
Specific Gravity, Urine: 1.01 (ref 1.005–1.030)
pH: 6 (ref 5.0–8.0)

## 2022-03-23 LAB — CBC
HCT: 35.5 % — ABNORMAL LOW (ref 36.0–46.0)
Hemoglobin: 11.4 g/dL — ABNORMAL LOW (ref 12.0–15.0)
MCH: 26.4 pg (ref 26.0–34.0)
MCHC: 32.1 g/dL (ref 30.0–36.0)
MCV: 82.2 fL (ref 80.0–100.0)
Platelets: 278 10*3/uL (ref 150–400)
RBC: 4.32 MIL/uL (ref 3.87–5.11)
RDW: 13.2 % (ref 11.5–15.5)
WBC: 10.1 10*3/uL (ref 4.0–10.5)
nRBC: 0 % (ref 0.0–0.2)

## 2022-03-23 LAB — URINALYSIS, MICROSCOPIC (REFLEX)

## 2022-03-23 LAB — TSH: TSH: 2.049 u[IU]/mL (ref 0.350–4.500)

## 2022-03-23 LAB — TROPONIN I (HIGH SENSITIVITY)
Troponin I (High Sensitivity): 4 ng/L (ref <18)
Troponin I (High Sensitivity): 3 ng/L (ref <18)

## 2022-03-23 LAB — LIPID PANEL
Cholesterol: 139 mg/dL (ref 0–200)
HDL: 44 mg/dL (ref >40)
LDL Cholesterol: 57 mg/dL (ref 0–99)
Total CHOL/HDL Ratio: 3.2 RATIO
Triglycerides: 189 mg/dL — ABNORMAL HIGH (ref <150)
VLDL: 38 mg/dL (ref 0–40)

## 2022-03-23 LAB — BASIC METABOLIC PANEL
Anion gap: 8 (ref 5–15)
BUN: 10 mg/dL (ref 6–20)
CO2: 22 mmol/L (ref 22–32)
Calcium: 8.7 mg/dL — ABNORMAL LOW (ref 8.9–10.3)
Chloride: 105 mmol/L (ref 98–111)
Creatinine, Ser: 0.58 mg/dL (ref 0.44–1.00)
GFR, Estimated: >60 mL/min (ref >60)
Glucose, Bld: 104 mg/dL — ABNORMAL HIGH (ref 70–99)
Potassium: 3.7 mmol/L (ref 3.5–5.1)
Sodium: 135 mmol/L (ref 135–145)

## 2022-03-23 LAB — RAPID URINE DRUG SCREEN, HOSP PERFORMED
Amphetamines: NOT DETECTED
Barbiturates: NOT DETECTED
Benzodiazepines: NOT DETECTED
Cocaine: NOT DETECTED
Opiates: NOT DETECTED
Tetrahydrocannabinol: NOT DETECTED

## 2022-03-23 LAB — HEMOGLOBIN A1C
Hgb A1c MFr Bld: 5.7 % — ABNORMAL HIGH (ref 4.8–5.6)
Mean Plasma Glucose: 117 mg/dL

## 2022-03-23 LAB — I-STAT BETA HCG BLOOD, ED (MC, WL, AP ONLY): I-stat hCG, quantitative: <5 m[IU]/mL (ref <5)

## 2022-03-23 LAB — D-DIMER, QUANTITATIVE: D-Dimer, Quant: <0.27 ug/mL-FEU (ref 0.00–0.50)

## 2022-03-23 MED ORDER — ONDANSETRON HCL 4 MG/2ML IJ SOLN: 4.0000 mg | Freq: Four times a day (QID) | INTRAMUSCULAR | Status: DC | PRN

## 2022-03-23 MED ORDER — ENOXAPARIN SODIUM 40 MG/0.4ML IJ SOSY
40.0000 mg | PREFILLED_SYRINGE | INTRAMUSCULAR | Status: DC
  Administered 2022-03-23: 40 mg via SUBCUTANEOUS
  Filled 2022-03-23: qty 0.4

## 2022-03-23 MED ORDER — ACETAMINOPHEN 325 MG PO TABS: 650.0000 mg | ORAL_TABLET | ORAL | Status: DC | PRN

## 2022-03-23 MED ORDER — ASPIRIN 81 MG PO TBEC
81.0000 mg | DELAYED_RELEASE_TABLET | Freq: Every day | ORAL | Status: DC
  Administered 2022-03-23 – 2022-03-24 (×2): 81 mg via ORAL
  Filled 2022-03-23 (×2): qty 1

## 2022-03-23 MED ORDER — SODIUM CHLORIDE 0.9 % IV BOLUS
1000.0000 mL | Freq: Once | INTRAVENOUS | Status: AC
  Administered 2022-03-23: 1000 mL via INTRAVENOUS

## 2022-03-23 MED ORDER — CYCLOBENZAPRINE HCL 10 MG PO TABS
5.0000 mg | ORAL_TABLET | Freq: Every day | ORAL | Status: DC | PRN
  Administered 2022-03-23: 5 mg via ORAL
  Filled 2022-03-23: qty 1

## 2022-03-23 NOTE — Telephone Encounter (Signed)
  Chief Complaint: chest pain since Sat night Symptoms: pt had syncopal episode after chest pain started Sat night, chest pain 5/10, left arm pain numbness Frequency: Sat Pertinent Negatives: Patient denies dizziness  Disposition: [x]$ ED /[]$ Urgent Care (no appt availability in office) / []$ Appointment(In office/virtual)/ []$  [facility]/ []$ Home Care/ []$ Refused Recommended Disposition /[]$ [facility]/ []$  Follow-up with PCP Additional Notes: Caller was not with pt. Advised daughter to take pt to ED ASAP Reason for Disposition  Pain also in shoulder(s) or arm(s) or jaw  (Exception: Pain is clearly made worse by movement.)  Answer Assessment - Initial Assessment Questions 1. LOCATION: "Where does it hurt?"       Chest and back pain 2. RADIATION: "Does the pain go anywhere else?" (e.g., into neck, jaw, arms, back)     Back, left arm 3. ONSET: "When did the chest pain begin?" (Minutes, hours or days)      Sat night- passes out       6. SEVERITY: "How bad is the pain?"  (e.g., Scale 1-10; mild, moderate, or severe)    - MILD (1-3): doesn't interfere with normal activities     - MODERATE (4-7): interferes with normal activities or awakens from sleep    - SEVERE (8-10): excruciating pain, unable to do any normal activities       moderate 10. OTHER SYMPTOMS: "Do you have any other symptoms?" (e.g., dizziness, nausea, vomiting, sweating, fever, difficulty breathing, cough)       Left arm numbness  Protocols used: Chest Pain-A-AH

## 2022-03-23 NOTE — ED Triage Notes (Signed)
Patient here for evaluation of constant stabbing chest pain that started after a syncopal episode on Saturday night. Patient states syncopal episode was witnessed by family who state she was unconscious for approximately fifteen minutes. Patient is alert, oriented, and in no apparent distress at this time.

## 2022-03-23 NOTE — Telephone Encounter (Signed)
Patient in the ED 03/23/2022

## 2022-03-23 NOTE — Plan of Care (Signed)

## 2022-03-23 NOTE — ED Provider Notes (Signed)
[place] Provider Note   CSN: [id number] Arrival date & time: 03/23/22  [id number]     History  Chief Complaint  Patient presents with   Loss of Consciousness   Chest Pain    Katherine [name] is a 47 y.o. female with Hx of splenomegaly, morbid obesity, anemia, depression presenting to the ED due to syncopal episode and chest discomfort.  Patient states Saturday she was sitting on the couch eating dinner, when she felt chest discomfort and left arm pain/numbness.  Patient states shortly after onset, she lost consciousness.  Was witnessed by family member who states she was unconscious for 10 to 15 minutes.  Did not see patient shaking, was just laying on the couch with whites of eyes showing.  No urinary/bowel incontinence, tongue biting, or fall/head injury.  Pt's family member was going to call EMS, however patient regained consciousness.  Denies postictal period, though did endorse some accompanying dizziness following the event.  States she had a glass of water and within 15 minutes symptoms had completely resolved and she felt much better.  Denies preceding aura, vision changes, N/V, fevers, neck stiffness, abdominal pain, urinary symptoms, diarrhea.  No known prior syncopal episodes per pt.  Then around 3 AM this morning, felt an instantaneous sharp pinching left-sided chest pain that relieved quickly on its own.  Felt this again around 6 AM which relieved the same way.  Pt states this caused enough concern to come to the ED.  Denies shortness of breath, recent URI or cough, recent injury.  Denies active chest pain or discomfort.  Denies active dizziness, though with some mild left arm subjective numbness.  No known family cardiac history.  Accompanied by family member at bedside who provides translation and some of history.  Hx also notable for prior hernia repair and bilateral salpingo-oophorectomy.    The history is provided by the  patient and medical records.  Loss of Consciousness     Home Medications Prior to Admission medications   Medication Sig Start Date End Date Taking? Authorizing Provider  cyclobenzaprine (FLEXERIL) 5 MG tablet Take 1 tablet (5 mg total) by mouth daily as needed for muscle spasms. 11/14/21  Yes Ladell Pier, MD  ibuprofen (ADVIL) 600 MG tablet Take 1 tablet (600 mg total) by mouth every 6 (six) hours as needed (Take with food). Patient taking differently: Take 600 mg by mouth daily as needed (Take with food). 11/14/21  Yes Ladell Pier, MD  fluticasone (FLONASE) 50 MCG/ACT nasal spray PLACE 2 SPRAYS INTO BOTH NOSTRILS DAILY AS NEEDED Patient not taking: Reported on 03/23/2022 11/09/19 11/28/20  Antony Blackbird, MD      Allergies    Patient has no known allergies.    Review of Systems   Review of Systems  Cardiovascular:  Positive for syncope.    Physical Exam Updated Vital Signs BP 132/68   Pulse 62   Temp 98.2 F (36.8 C) (Oral)   Resp 16   LMP  (LMP Unknown)   SpO2 98%  Physical Exam Vitals and nursing note reviewed. Exam conducted with a chaperone present.  Constitutional:      General: She is not in acute distress.    Appearance: She is well-developed.  HENT:     Head: Normocephalic and atraumatic.  Eyes:     General: Lids are normal. Gaze aligned appropriately.     Conjunctiva/sclera: Conjunctivae normal.  Cardiovascular:     Rate and Rhythm: Normal rate  and regular rhythm.     Heart sounds: No murmur heard.    Comments: Radial, DP, PT 2+ bilat.  No LE edema, erythema, tenderness bilaterally.  No murmur appreciated. Pulmonary:     Effort: Pulmonary effort is normal. No respiratory distress.     Breath sounds: Normal breath sounds.  Chest:     Chest wall: No lacerations, deformity, swelling, tenderness, crepitus or edema.     Comments: NonTTP Abdominal:     General: Abdomen is protuberant.     Palpations: Abdomen is soft. There is no pulsatile mass.      Tenderness: There is no abdominal tenderness. There is no right CVA tenderness, left CVA tenderness or guarding.  Musculoskeletal:        General: No swelling.       Arms:     Cervical back: Neck supple.  Skin:    General: Skin is warm and dry.     Capillary Refill: Capillary refill takes less than 2 seconds.  Neurological:     General: No focal deficit present.     Mental Status: She is alert and oriented to person, place, and time. Mental status is at baseline.     GCS: GCS eye subscore is 4. GCS verbal subscore is 5. GCS motor subscore is 6.     Motor: No weakness, tremor or seizure activity.     Comments: AAOx4.  Follows simple commands.  No dysarthria. II: Visual fields are blink to threat bilaterally.  PERRLA. III, IV, VI: EOMI to tracking examiner's face and to finger "H" test V: Facial sensation appears symmetrically intact VII: No facial asymmetry.  Facial movement appears equal and appropriate bilaterally VIII: Hearing intact to voice X: Uvula elevates symmetrically XI: Shoulder shrug is symmetric XII: Tongue is midline without atrophy or fasciculations Sensation with reported variation see above.  Finger-nose-finger and heel-shin intact bilaterally.  No truncal deviation.  Psychiatric:        Mood and Affect: Mood normal.     ED Results / Procedures / Treatments   Labs (all labs ordered are listed, but only abnormal results are displayed) Labs Reviewed  BASIC METABOLIC PANEL - Abnormal; Notable for the following components:      Result Value   Glucose, Bld 104 (*)    Calcium 8.7 (*)    All other components within normal limits  CBC - Abnormal; Notable for the following components:   Hemoglobin 11.4 (*)    HCT 35.5 (*)    All other components within normal limits  D-DIMER, QUANTITATIVE  RAPID URINE DRUG SCREEN, HOSP PERFORMED  I-STAT BETA HCG BLOOD, ED ([facility], [facility], [facility] ONLY)  TROPONIN I (HIGH SENSITIVITY)  TROPONIN I (HIGH SENSITIVITY)    EKG EKG  Interpretation  Date/Time:  Monday March 23 2022 09:47:38 EST Ventricular Rate:  58 PR Interval:  130 QRS Duration: 121 QT Interval:  451 QTC Calculation: 443 R Axis:   39 Text Interpretation: Sinus rhythm Nonspecific intraventricular conduction delay No significant change was found Confirmed by Ezequiel Essex [id number]) on 03/23/2022 9:50:28 AM  Radiology DG Chest 2 View  Result Date: 03/23/2022 CLINICAL DATA:  Chest pain EXAM: CHEST - 2 VIEW COMPARISON:  CXR 07/29/21 FINDINGS: No pleural effusion. No pneumothorax. Unchanged cardiac and mediastinal contours. No radiographically apparent displaced rib fractures. Visualized upper abdomen is unremarkable. Degenerative changes of the bilateral AC joints. Vertebral body heights are maintained. IMPRESSION: No focal airspace opacity. Electronically Signed   By: Marin Roberts M.D.   On:  03/23/2022 10:46    Procedures Procedures    Medications Ordered in ED Medications  sodium chloride 0.9 % bolus 1,000 mL (1,000 mLs Intravenous New Bag/Given 03/23/22 1235)    ED Course/ Medical Decision Making/ A&P Clinical Course as of 03/23/22 1353  Mon Mar 23, 2022  1122 D-Dimer, Quant: <0.27 Neg [AC]  1350 Consulted with Dr. Roosevelt Locks of hospitalist team.  Discussed patient's case in detail.  Concern for syncope in the presence of proceeding and following chest pain on Saturday, with 2 episodes of chest pain today.  No Hx of cardiac workup per patient.  Overall workup today not suggestive of MI, PE, pneumonia, or pregnancy.  Unknown cause at this time.  Agrees with plan for admission for further workup. [AC]    Clinical Course User Index [AC] Prince Rome, PA-C                             Medical Decision Making  47 y.o. female presents to the ED for concern of Loss of Consciousness and Chest Pain   This involves an extensive number of treatment options, and is a complaint that carries with it a high risk of complications and morbidity.  The  emergent differential diagnosis prior to evaluation includes, but is not limited to: arrhythmia, ACS, GERD, pneumothorax, PE, CHF  This is not an exhaustive differential.   Past Medical History / Co-morbidities / Social History: Hx of splenomegaly, morbid obesity, anemia, depression Social Determinants of Health include: Language barrier, family member at bedside provides translation  Additional History:  Obtained by chart review.  Notably prior ED visits, see for details 06/20/2014 ED visit: chief complaint of syncope with associated dizziness  Lab Tests: I ordered, and personally interpreted labs.  The pertinent results include:   Mild anemia, close to baseline D-dimer and i-STAT beta-hCG negative Troponin 4, 3  Imaging Studies: I ordered imaging studies including CXR.   I independently visualized and interpreted imaging which showed no evidence of acute cardiopulmonary pathology.  I agree with the radiologist interpretation. I personally ordered and interpreted EKG 12 lead findings, which showed: sinus rhythm, without evidence of WPW, interval changes, Afib, Brugada, or other dysrhythmia.    ED Course: Pt well-appearing on exam.  Presenting with syncopal episode and intermittent chest discomfort/pain.  Originally had chest discomfort shortly preceding syncopal episode on Saturday.  Occurred while seated, no prior orthostatic movements, positional changes, or valsalva.  No postictal period or aura.  Then two instantaneous sharp, pinching left-sided, non-radiating chest pain sensations today at 3AM and 6AM, resolved immediately on own.  No active chest pain.  No recent URI or cough or injury/trauma.  No headache, vision changes, neck stiffness, fever.  No other family members in close quarters with symptoms, low suspicion for carbon monoxide toxicity.  Hx of bilateral salpingo-oophorectomy, low suspicion for pregnancy/ectopic. HEAR score of 3-4, low-moderate risk before consideration of  troponins.  No active chest pain/discomfort.  ASA and nitroglycerin held, do not seem appropriate at this time.  PERC negative. Well's score 0, low probability.  D-dimer negative.  Labs unremarkable.  Unlikely pneumonia, no cough, no leukocytosis, no fevers, CXR and exam without acute findings.  Unlikely pneumothorax, no findings on CXR.  Unlikely pericarditis/myocarditis, GERD, PUD, as does not fit clinical picture. Chest pain not exertional.  No evidence of pleural effusion or pulmonary edema on CXR.  Unlikely dissection, no pulse deficit, no tearing chest pain, though with subjective  localized anterior upper extremity decreased sensation.  EKG findings indicate sinus rhythm without evidence of acute ischemic changes, abnormal intervals, or dysrhythmia.  Troponins negative.   Orthostatic VS with mild elicited subjective dizziness per pt, informed by nurse.  No syncopal episode or chest pain since arrival to ED. Low suspicion of myocardial infarction or PE, however strong concern for associated chest pain/discomfort with syncopal episode.  Overall, I am uncertain the exact etiology of the patient's symptoms.  Though due to significant risk, recommend admission for further workup likely including echo. Consulted with hospitalist team, agrees with for admission  Disposition: Admission for syncope with chest pain, unknown cause, for further workup.  I discussed this case with my attending physician Dr. Wyvonnia Dusky.  Attending physician stated agreement with plan or made changes to plan which were implemented.     This chart was dictated using voice recognition software.  Despite best efforts to proofread, errors can occur which can change the documentation meaning.         Final Clinical Impression(s) / ED Diagnoses Final diagnoses:  Syncope, unspecified syncope type  Chest pain, unspecified type    Rx / DC Orders ED Discharge Orders     None         Candace Cruise [phone]  [phone]    Ezequiel Essex, MD 03/23/22 [phone]

## 2022-03-23 NOTE — Progress Notes (Signed)
  Echocardiogram 2D Echocardiogram has been performed.  Katherine [name] 03/23/2022, 4:14 PM

## 2022-03-23 NOTE — ED Notes (Signed)
Pt states that she felt a little dizzy when she stood up for orthostatics vital signs.

## 2022-03-23 NOTE — ED Notes (Signed)
ED TO INPATIENT HANDOFF REPORT  ED Nurse Name and Phone #: Jessel Gettinger  [phone]  S Name/Age/Gender Katherine [name] 47 y.o. female Room/Bed: 046C/046C  Code Status   Code Status: Full Code  Home/SNF/Other Home Patient oriented to: self, place, time, and situation Is this baseline? Yes   Triage Complete: Triage complete  Chief Complaint Chest pain [R07.9]  Triage Note Patient here for evaluation of constant stabbing chest pain that started after a syncopal episode on Saturday night. Patient states syncopal episode was witnessed by family who state she was unconscious for approximately fifteen minutes. Patient is alert, oriented, and in no apparent distress at this time.   Allergies No Known Allergies  Level of Care/Admitting Diagnosis ED Disposition     ED Disposition  Admit   Condition  --   Comment  Hospital Area: [facility] [100100]  Level of Care: Telemetry Medical [104]  May place patient in observation at [facility] or [facility] if equivalent level of care is available:: No  Covid Evaluation: Asymptomatic - no recent exposure (last 10 days) testing not required  Diagnosis: Chest pain [id number]  Admitting Physician: Lequita Halt [id number]  Attending Physician: Lequita Halt [id number]          B Medical/Surgery History Past Medical History:  Diagnosis Date   Abdominal pain    Anemia    Depression    postpartum depression   Morbid obesity ([place])    Splenomegaly    Past Surgical History:  Procedure Laterality Date   nexplanon     NO PAST SURGERIES     ROBOTIC ASSISTED LAPAROSCOPIC VENTRAL/INCISIONAL HERNIA REPAIR N/A 01/04/2018   Procedure: ROBOTIC ASSISTED LAPAROSCOPIC [place];  Surgeon: Jules Husbands, MD;  Location: [facility] ORS;  Service: General;  Laterality: N/A;   ROBOTIC ASSISTED SALPINGO OOPHERECTOMY Bilateral 01/04/2018   Procedure: ROBOTIC ASSISTED SALPINGECTOMY;  Surgeon: Malachy Mood, MD;   Location: [facility] ORS;  Service: Gynecology;  Laterality: Bilateral;   TUBAL LIGATION     UPPER GI ENDOSCOPY  2015     A IV Location/Drains/Wounds Patient Lines/Drains/Airways Status     Active Line/Drains/Airways     Name Placement date Placement time Site Days   Peripheral IV 03/23/22 20 G Left Antecubital 03/23/22  1016  Antecubital  less than 1   Incision (Closed) 01/04/18 Abdomen Other (Comment) 01/04/18  1153  -- 1539   Incision - 4 Ports Abdomen Medial;Upper Right;Lateral Right;Lower;Lateral Right;Lower 01/04/18  1122  -- 1539            Intake/Output Last 24 hours No intake or output data in the 24 hours ending 03/23/22 1431  Labs/Imaging Results for orders placed or performed during the hospital encounter of 03/23/22 (from the past 48 hour(s))  Basic metabolic panel     Status: Abnormal   Collection Time: 03/23/22 10:13 AM  Result Value Ref Range   Sodium 135 135 - 145 mmol/L   Potassium 3.7 3.5 - 5.1 mmol/L   Chloride 105 98 - 111 mmol/L   CO2 22 22 - 32 mmol/L   Glucose, Bld 104 (H) 70 - 99 mg/dL    Comment: Glucose reference range applies only to samples taken after fasting for at least 8 hours.   BUN 10 6 - 20 mg/dL   Creatinine, Ser 0.58 0.44 - 1.00 mg/dL   Calcium 8.7 (L) 8.9 - 10.3 mg/dL   GFR, Estimated >60 >60 mL/min    Comment: (NOTE) Calculated using the CKD-EPI Creatinine  Equation (2021)    Anion gap 8 5 - 15    Comment: Performed at [facility], [place] [address] [place], [place] [address]  CBC     Status: Abnormal   Collection Time: 03/23/22 10:13 AM  Result Value Ref Range   WBC 10.1 4.0 - 10.5 K/uL   RBC 4.32 3.87 - 5.11 MIL/uL   Hemoglobin 11.4 (L) 12.0 - 15.0 g/dL   HCT 35.5 (L) 36.0 - 46.0 %   MCV 82.2 80.0 - 100.0 fL   MCH 26.4 26.0 - 34.0 pg   MCHC 32.1 30.0 - 36.0 g/dL   RDW 13.2 11.5 - 15.5 %   Platelets 278 150 - 400 K/uL   nRBC 0.0 0.0 - 0.2 %    Comment: Performed at [facility], [place] [address], [place], [place]  [address]  Troponin I (High Sensitivity)     Status: None   Collection Time: 03/23/22 10:13 AM  Result Value Ref Range   Troponin I (High Sensitivity) 4 <18 ng/L    Comment: (NOTE) Elevated high sensitivity troponin I (hsTnI) values and significant  changes across serial measurements may suggest ACS but many other  chronic and acute conditions are known to elevate hsTnI results.  Refer to the "Links" section for chest pain algorithms and additional  guidance. Performed at [facility], [place] [address] [place], [place] [address]   D-dimer, quantitative     Status: None   Collection Time: 03/23/22 10:13 AM  Result Value Ref Range   D-Dimer, Quant <0.27 0.00 - 0.50 ug/mL-FEU    Comment: (NOTE) At the manufacturer cut-off value of 0.5 g/mL FEU, this assay has a negative predictive value of 95-100%.This assay is intended for use in conjunction with a clinical pretest probability (PTP) assessment model to exclude pulmonary embolism (PE) and deep venous thrombosis (DVT) in outpatients suspected of PE or DVT. Results should be correlated with clinical presentation. Performed at [facility], [place] [address] [place], [place] [address]   I-Stat beta hCG blood, ED     Status: None   Collection Time: 03/23/22 11:06 AM  Result Value Ref Range   I-stat hCG, quantitative <5.0 <5 mIU/mL   Comment 3            Comment:   GEST. AGE      CONC.  (mIU/mL)   <=1 WEEK        5 - 50     2 WEEKS       50 - 500     3 WEEKS       100 - 10,000     4 WEEKS     1,000 - 30,000        FEMALE AND NON-PREGNANT FEMALE:     LESS THAN 5 mIU/mL   Troponin I (High Sensitivity)     Status: None   Collection Time: 03/23/22 12:08 PM  Result Value Ref Range   Troponin I (High Sensitivity) 3 <18 ng/L    Comment: (NOTE) Elevated high sensitivity troponin I (hsTnI) values and significant  changes across serial measurements may suggest ACS but many other  chronic and acute conditions are known to elevate  hsTnI results.  Refer to the "Links" section for chest pain algorithms and additional  guidance. Performed at [facility], [place] [address] [place], [place] [address]    DG Chest 2 View  Result Date: 03/23/2022 CLINICAL DATA:  Chest pain EXAM:  CHEST - 2 VIEW COMPARISON:  CXR 07/29/21 FINDINGS: No pleural effusion. No pneumothorax. Unchanged cardiac and mediastinal contours. No radiographically apparent displaced rib fractures. Visualized upper abdomen is unremarkable. Degenerative changes of the bilateral AC joints. Vertebral body heights are maintained. IMPRESSION: No focal airspace opacity. Electronically Signed   By: Marin Roberts M.D.   On: 03/23/2022 10:46    Pending Labs Unresulted Labs (From admission, onward)     Start     Ordered   03/23/22 1417  Hemoglobin A1c  Add-on,   AD        03/23/22 1416   03/23/22 1408  Lipid panel  Add-on,   AD        03/23/22 1407   03/23/22 1408  TSH  Add-on,   AD        03/23/22 1407   03/23/22 1406  HIV Antibody (routine testing w rflx)  (HIV Antibody (Routine testing w reflex) panel)  Once,   R        03/23/22 1407   03/23/22 1358  Urinalysis, Routine w reflex microscopic -Urine, Clean Catch  ONCE - URGENT,   URGENT       Question:  Specimen Source  Answer:  Urine, Clean Catch   03/23/22 1357   03/23/22 1353  Rapid urine drug screen (hospital performed)  ONCE - STAT,   STAT        03/23/22 1352            Vitals/Pain Today's Vitals   03/23/22 1215 03/23/22 1230 03/23/22 1344 03/23/22 1353  BP: 125/80 136/77 132/68   Pulse: 64 61 62   Resp: '20 18 16   '$ Temp:   98.2 F (36.8 C)   TempSrc:   Oral   SpO2: 99% 96% 98%   Weight:    96.9 kg  Height:    '5\' 2"'$  (1.575 m)  PainSc:   2      Isolation Precautions No active isolations  Medications Medications  cyclobenzaprine (FLEXERIL) tablet 5 mg (has no administration in time range)  acetaminophen (TYLENOL) tablet 650 mg (has no administration in time range)  ondansetron  (ZOFRAN) injection 4 mg (has no administration in time range)  enoxaparin (LOVENOX) injection 40 mg (has no administration in time range)  aspirin EC tablet 81 mg (has no administration in time range)  sodium chloride 0.9 % bolus 1,000 mL (1,000 mLs Intravenous New Bag/Given 03/23/22 1235)    Mobility walks     Focused Assessments Cardiac Assessment Handoff:  Cardiac Rhythm: Sinus bradycardia Lab Results  Component Value Date   CKTOTAL 45 02/24/2018   Lab Results  Component Value Date   DDIMER <0.27 03/23/2022   Does the Patient currently have chest pain? Yes    R Recommendations: See Admitting Provider Note  Report given to:   Additional Notes:   stress test in am, npo after midnight                                   Spanish speaking, translater

## 2022-03-23 NOTE — H&P (Signed)
History and Physical    Katherine [name] [id number] DOB: [birth year] DOA: 03/23/2022  PCP: Ladell Pier, MD (Confirm with patient/family/NH records and if not entered, this has to be entered at [facility] point of entry) Patient coming from: Home  I have personally briefly reviewed patient's old medical records in [place]  Chief Complaint: Chest pain  HPI: Katherine [name] is a 47 y.o. female with medical history significant of obesity, chronic back pain, chronic normocytic anemia, presented with repeated episodes of chest pains.  Symptoms started 2 days ago, patient woke up with new onset of pressure-like chest pain, on the order of sternum, pressure-like, 7-8/10, radiated to left shoulder and left forearm, symptoms lasted about 20 minutes and subsided by its own, no significant exacerbation or relieving factors.  She subsequently had 2 other episodes yesterday and one similar episode this morning while she was at rest.  1 episode accompanied with shortness of breath and lightheadedness and resolved in 20-30 minutes without intervention.  No history of diabetes hypertension or heart problems, no family history of early CAD's.  She is non-smoker no drug use.  She takes as needed Flexeril and ibuprofen for back pain but she described the chest pain as new and very different in nature compared to the back pain. ED Course: Heart rate 60, blood pressure 137/76, O2 saturation 99% on room air.  EKG showed no significant ST-T changes, and troponin negative x 2.  Review of Systems: As per HPI otherwise 14 point review of systems negative.    Past Medical History:  Diagnosis Date   Abdominal pain    Anemia    Depression    postpartum depression   Morbid obesity ([place])    Splenomegaly     Past Surgical History:  Procedure Laterality Date   nexplanon     NO PAST SURGERIES     ROBOTIC ASSISTED LAPAROSCOPIC VENTRAL/INCISIONAL HERNIA REPAIR N/A 01/04/2018   Procedure:  ROBOTIC ASSISTED LAPAROSCOPIC VENTRAL/INCISIONAL HERNIA REPAIR;  Surgeon: Jules Husbands, MD;  Location: [facility] ORS;  Service: General;  Laterality: N/A;   ROBOTIC ASSISTED SALPINGO OOPHERECTOMY Bilateral 01/04/2018   Procedure: ROBOTIC ASSISTED SALPINGECTOMY;  Surgeon: Malachy Mood, MD;  Location: [facility] ORS;  Service: Gynecology;  Laterality: Bilateral;   TUBAL LIGATION     UPPER GI ENDOSCOPY  2015     reports that she has been smoking cigarettes. She has never used smokeless tobacco. She reports current alcohol use. She reports that she does not use drugs.  No Known Allergies  Family History  Problem Relation Age of Onset   Diabetes Father    Kidney disease Brother      Prior to Admission medications   Medication Sig Start Date End Date Taking? Authorizing Provider  cyclobenzaprine (FLEXERIL) 5 MG tablet Take 1 tablet (5 mg total) by mouth daily as needed for muscle spasms. 11/14/21  Yes Ladell Pier, MD  ibuprofen (ADVIL) 600 MG tablet Take 1 tablet (600 mg total) by mouth every 6 (six) hours as needed (Take with food). Patient taking differently: Take 600 mg by mouth daily as needed (Take with food). 11/14/21  Yes Ladell Pier, MD  fluticasone [facility]) 50 MCG/ACT nasal spray PLACE 2 SPRAYS INTO BOTH NOSTRILS DAILY AS NEEDED Patient not taking: Reported on 03/23/2022 11/09/19 11/28/20  Antony Blackbird, MD    Physical Exam: Vitals:   03/23/22 1215 03/23/22 1230 03/23/22 1344 03/23/22 1353  BP: 125/80 136/77 132/68   Pulse: 64 61 62   Resp: 20  18 16   Temp:   98.2 F (36.8 C)   TempSrc:   Oral   SpO2: 99% 96% 98%   Weight:    96.9 kg  Height:    '5\' 2"'$  (1.575 m)    Constitutional: NAD, calm, comfortable Vitals:   03/23/22 1215 03/23/22 1230 03/23/22 1344 03/23/22 1353  BP: 125/80 136/77 132/68   Pulse: 64 61 62   Resp: '20 18 16   '$ Temp:   98.2 F (36.8 C)   TempSrc:   Oral   SpO2: 99% 96% 98%   Weight:    96.9 kg  Height:    '5\' 2"'$  (1.575 m)   Eyes:  PERRL, lids and conjunctivae normal ENMT: Mucous membranes are moist. Posterior pharynx clear of any exudate or lesions.Normal dentition.  Neck: normal, supple, no masses, no thyromegaly Respiratory: clear to auscultation bilaterally, no wheezing, no crackles. Normal respiratory effort. No accessory muscle use.  Cardiovascular: Regular rate and rhythm, no murmurs / rubs / gallops. No extremity edema. 2+ pedal pulses. No carotid bruits.  Abdomen: no tenderness, no masses palpated. No hepatosplenomegaly. Bowel sounds positive.  Musculoskeletal: no clubbing / cyanosis. No joint deformity upper and lower extremities. Good ROM, no contractures. Normal muscle tone.  Skin: no rashes, lesions, ulcers. No induration Neurologic: CN 2-12 grossly intact. Sensation intact, DTR normal. Strength 5/5 in all 4.  Psychiatric: Normal judgment and insight. Alert and oriented x 3. Normal mood.     Labs on Admission: I have personally reviewed following labs and imaging studies  CBC: Recent Labs  Lab 03/23/22 1013  WBC 10.1  HGB 11.4*  HCT 35.5*  MCV 82.2  PLT 0000000   Basic Metabolic Panel: Recent Labs  Lab 03/23/22 1013  NA 135  K 3.7  CL 105  CO2 22  GLUCOSE 104*  BUN 10  CREATININE 0.58  CALCIUM 8.7*   GFR: Estimated Creatinine Clearance: 96.5 mL/min (by C-G formula based on SCr of 0.58 mg/dL). Liver Function Tests: No results for input(s): "AST", "ALT", "ALKPHOS", "BILITOT", "PROT", "ALBUMIN" in the last 168 hours. No results for input(s): "LIPASE", "AMYLASE" in the last 168 hours. No results for input(s): "AMMONIA" in the last 168 hours. Coagulation Profile: No results for input(s): "INR", "PROTIME" in the last 168 hours. Cardiac Enzymes: No results for input(s): "CKTOTAL", "CKMB", "CKMBINDEX", "TROPONINI" in the last 168 hours. BNP (last 3 results) No results for input(s): "PROBNP" in the last 8760 hours. HbA1C: No results for input(s): "HGBA1C" in the last 72 hours. CBG: No  results for input(s): "GLUCAP" in the last 168 hours. Lipid Profile: No results for input(s): "CHOL", "HDL", "LDLCALC", "TRIG", "CHOLHDL", "LDLDIRECT" in the last 72 hours. Thyroid Function Tests: No results for input(s): "TSH", "T4TOTAL", "FREET4", "T3FREE", "THYROIDAB" in the last 72 hours. Anemia Panel: No results for input(s): "VITAMINB12", "FOLATE", "FERRITIN", "TIBC", "IRON", "RETICCTPCT" in the last 72 hours. Urine analysis:    Component Value Date/Time   COLORURINE YELLOW 02/24/2018 0044   APPEARANCEUR HAZY (A) 02/24/2018 0044   LABSPEC 1.018 02/24/2018 0044   PHURINE 5.0 02/24/2018 0044   GLUCOSEU NEGATIVE 02/24/2018 0044   HGBUR LARGE (A) 02/24/2018 0044   BILIRUBINUR negative 01/11/2019 1044   KETONESUR negative 01/11/2019 1044   KETONESUR NEGATIVE 02/24/2018 0044   PROTEINUR NEGATIVE 02/24/2018 0044   UROBILINOGEN 0.2 01/11/2019 1044   UROBILINOGEN 0.2 11/16/2014 1616   NITRITE Negative 01/11/2019 1044   NITRITE POSITIVE (A) 02/24/2018 0044   LEUKOCYTESUR Small (1+) (A) 01/11/2019 1044  Radiological Exams on Admission: DG Chest 2 View  Result Date: 03/23/2022 CLINICAL DATA:  Chest pain EXAM: CHEST - 2 VIEW COMPARISON:  CXR 07/29/21 FINDINGS: No pleural effusion. No pneumothorax. Unchanged cardiac and mediastinal contours. No radiographically apparent displaced rib fractures. Visualized upper abdomen is unremarkable. Degenerative changes of the bilateral AC joints. Vertebral body heights are maintained. IMPRESSION: No focal airspace opacity. Electronically Signed   By: Marin Roberts M.D.   On: 03/23/2022 10:46    EKG: Independently reviewed.  Sinus rhythm, no acute ST changes.  Assessment/Plan Principal Problem:   Chest pain  (please populate well all problems here in Problem List. (For example, if patient is on BP meds at home and you resume or decide to hold them, it is a problem that needs to be her. Same for CAD, COPD, HLD and so on)  Chest pain -Angina like  with accompanying symptoms of near syncope and dyspnea at rest -ACS ruled out -TIMI=1, rule out underlying CAD, will order Stress test, treadmill -Aspirin, heart rate borderline low, hold off beta-blocker.  Check A1c and lipid panel, check UDS -Echo  Chronic back pain -Controlled, continue as needed Flexeril  DVT prophylaxis: Lovenox Code Status: Full code Family Communication: Daughter over the phone Disposition Plan: Expect less than 2 midnight hospital stay Consults called: None Admission status: Tele obs   Lequita Halt MD [facility] Pager [phone]  03/23/2022, 2:08 PM

## 2022-03-24 ENCOUNTER — Other Ambulatory Visit: Payer: Self-pay | Admitting: Physician Assistant

## 2022-03-24 ENCOUNTER — Observation Stay (HOSPITAL_BASED_OUTPATIENT_CLINIC_OR_DEPARTMENT_OTHER): Payer: Self-pay

## 2022-03-24 ENCOUNTER — Observation Stay (INDEPENDENT_AMBULATORY_CARE_PROVIDER_SITE_OTHER): Payer: Self-pay

## 2022-03-24 DIAGNOSIS — R0683 Snoring: Secondary | ICD-10-CM

## 2022-03-24 DIAGNOSIS — R0789 Other chest pain: Secondary | ICD-10-CM

## 2022-03-24 DIAGNOSIS — R55 Syncope and collapse: Secondary | ICD-10-CM

## 2022-03-24 DIAGNOSIS — R079 Chest pain, unspecified: Secondary | ICD-10-CM

## 2022-03-24 DIAGNOSIS — I5032 Chronic diastolic (congestive) heart failure: Secondary | ICD-10-CM

## 2022-03-24 LAB — TROPONIN I (HIGH SENSITIVITY)
Troponin I (High Sensitivity): 4 ng/L (ref <18)
Troponin I (High Sensitivity): 4 ng/L (ref <18)

## 2022-03-24 LAB — HIV ANTIBODY (ROUTINE TESTING W REFLEX): HIV Screen 4th Generation wRfx: NONREACTIVE

## 2022-03-24 MED ORDER — MORPHINE SULFATE (PF) 2 MG/ML IV SOLN: 2.0000 mg | INTRAVENOUS | Status: DC | PRN

## 2022-03-24 MED ORDER — NITROGLYCERIN 0.4 MG SL SUBL
0.4000 mg | SUBLINGUAL_TABLET | SUBLINGUAL | Status: DC | PRN
  Administered 2022-03-24 (×3): 0.4 mg via SUBLINGUAL
  Filled 2022-03-24: qty 1

## 2022-03-24 MED ORDER — NITROGLYCERIN 0.4 MG SL SUBL
SUBLINGUAL_TABLET | SUBLINGUAL | Status: AC
  Administered 2022-03-24: 0.8 mg via SUBLINGUAL
  Filled 2022-03-24: qty 2

## 2022-03-24 MED ORDER — NITROGLYCERIN 0.4 MG SL SUBL: 0.8000 mg | SUBLINGUAL_TABLET | Freq: Once | SUBLINGUAL | Status: AC

## 2022-03-24 MED ORDER — DICLOFENAC SODIUM 1 % EX GEL
2.0000 g | Freq: Four times a day (QID) | CUTANEOUS | Status: DC
  Filled 2022-03-24: qty 100

## 2022-03-24 MED ORDER — IOHEXOL 350 MG/ML SOLN
100.0000 mL | Freq: Once | INTRAVENOUS | Status: AC | PRN
  Administered 2022-03-24: 100 mL via INTRAVENOUS

## 2022-03-24 NOTE — Discharge Summary (Signed)
Physician Discharge Summary  Katherine [name] [id number] DOB: 04/24/1975 DOA: 03/23/2022  PCP: Ladell Pier, MD  Admit date: 03/23/2022 Discharge date: 03/24/2022  Time spent: 40 minutes  Recommendations for Outpatient Follow-up:  Follow outpatient CBC/CMP  Follow with cardiology Ziopatch per cards Follow overread of coronary CT by rads Further w/u of syncope outpatient    Discharge Diagnoses:  Principal Problem:   Chest pain   Discharge Condition: stable  Diet recommendation: heart healthy  Filed Weights   03/23/22 1353 03/23/22 1649  Weight: 96.9 kg 99.8 kg    History of present illness:   Katherine [name] is Katherine [name] 47 y.o. female with medical history significant of obesity, chronic back pain, chronic normocytic anemia, presented with repeated episodes of chest pains.   Coronary CT reassuring.  Plan for outpatient follow up and ziopatch at discharge given syncopal episode.   Hospital Course:  Assessment and Plan:  Chest Pain Appreciate cardiology assistance - suspect msk etiology - coronary CT with minimal CAD, CADRADS 1 Negative d dimer.  Negative troponin. Nitro prn pain.  Morphine prn pain LDL 57.  A1c 5.7 Appreciate cardiology assistance Some CP with TTP, but CP episodes she notes are more severe   Syncope Episode of syncope following chest pain Zacary Bauer few days ago while she was sitting down.  LOC 15 min. Echo with normal EF, no WMA, grade II diastolic dysfunction - Orthostatics negative Will need cardiac monitor at discharge - ziopatch per cards   Chronic Pain Muscle relaxer Hold ibuprofen    Procedures: Echo IMPRESSIONS     1. Left ventricular ejection fraction, by estimation, is 60 to 65%. The  left ventricle has normal function. Left ventricular endocardial border  not optimally defined to evaluate regional wall motion. Left ventricular  diastolic parameters are consistent  with Grade II diastolic dysfunction (pseudonormalization).  Elevated left  ventricular end-diastolic pressure.   2. Right ventricular systolic function is normal. The right ventricular  size is normal. Tricuspid regurgitation signal is inadequate for assessing  PA pressure.   3. Left atrial size was mildly dilated.   4. The mitral valve is normal in structure. No evidence of mitral valve  regurgitation. No evidence of mitral stenosis.   5. The aortic valve is normal in structure. Aortic valve regurgitation is  not visualized. No aortic stenosis is present.    Consultations: cardiology  Discharge Exam: Vitals:   03/24/22 0837 03/24/22 1210  BP: 133/80 116/80  Pulse: 73 63  Resp:  17  Temp:  97.6 F (36.4 C)  SpO2:     See progress note Walked without issue, negative orthostatics Discussed on phone with daughter prior to discharge Discharge Instructions   Discharge Instructions     Call MD for:  difficulty breathing, headache or visual disturbances   Complete by: As directed    Call MD for:  extreme fatigue   Complete by: As directed    Call MD for:  hives   Complete by: As directed    Call MD for:  persistant dizziness or light-headedness   Complete by: As directed    Call MD for:  persistant nausea and vomiting   Complete by: As directed    Call MD for:  redness, tenderness, or signs of infection (pain, swelling, redness, odor or green/yellow discharge around incision site)   Complete by: As directed    Call MD for:  severe uncontrolled pain   Complete by: As directed    Call MD for:  temperature >100.4  Complete by: As directed    Diet - low sodium heart healthy   Complete by: As directed    Discharge instructions   Complete by: As directed    You were seen for chest pain.  You were seen by cardiology and had Regenia Erck coronary CT scan that was reassuring.  You have minimal coronary artery disease.   It's unclear what caused your loss of consciousness.  We're going to send you home with Artie Takayama cardiac event monitor to watch your  heart rhythm going forward.  You should follow up with cardiology outpatient.  It's unclear exactly what caused your chest pain.  There may be Rubee Vega musculoskeletal component as you had pain when I pressed on your chest.  You should continue to follow up with your PCP regarding your symptoms outpatient.    Return for new, recurrent, or worsening symptoms.  Please ask your PCP to request records from this hospitalization so they know what was done and what the next steps will be.   Increase activity slowly   Complete by: As directed       Allergies as of 03/24/2022   No Known Allergies      Medication List     TAKE these medications    cyclobenzaprine 5 MG tablet Commonly known as: FLEXERIL Take 1 tablet (5 mg total) by mouth daily as needed for muscle spasms.   fluticasone 50 MCG/ACT nasal spray Commonly known as: FLONASE PLACE 2 SPRAYS INTO BOTH NOSTRILS DAILY AS NEEDED   ibuprofen 600 MG tablet Commonly known as: ADVIL Take 1 tablet (600 mg total) by mouth every 6 (six) hours as needed (Take with food). What changed: when to take this       No Known Allergies  Follow-up Information     Ladell Pier, MD Follow up.   Specialty: Internal Medicine Contact information: [address] [place] [address] [phone]                  The results of significant diagnostics from this hospitalization (including imaging, microbiology, ancillary and laboratory) are listed below for reference.    Significant Diagnostic Studies: CT CORONARY MORPH W/CTA COR W/SCORE W/CA W/CM &/OR WO/CM  Result Date: 03/24/2022 HISTORY: Chest pain/anginal equiv, ECGs or troponins abnormal EXAM: Cardiac/Coronary  CT TECHNIQUE: The patient was scanned on Kadrian Part[employer]. PROTOCOL: Hatsuko Bizzarro 120 kV prospective scan was triggered in the descending thoracic aorta at 111 HU's. Axial non-contrast 3 mm slices were carried out through the heart. The data set was analyzed on Jhalen Eley  dedicated work station and scored using the Agatston method. Gantry rotation speed was 250 msecs and collimation was .6 mm. Beta blockade and 0.8 mg of sl NTG was given. The 3D data set was reconstructed in 5% intervals of the 35-75 % of the R-R cycle. Systolic and diastolic phases were analyzed on Hajar Penninger dedicated work station using MPR, MIP and VRT modes. The patient received contrast: 173m OMNIPAQUE IOHEXOL 350 MG/ML SOLN. FINDINGS: Image quality: Good Noise artifact is: Moderate slab artifact. Coronary calcium score is 0. Coronary arteries: Normal coronary origins.  Right dominance. Right Coronary Artery: No detectable plaque or stenosis. Left Main Coronary Artery: No detectable plaque or stenosis. Left Anterior Descending Coronary Artery: Minimal atherosclerotic plaque in the mid LAD, <25% stenosis. Left Circumflex Artery: Minimal atherosclerotic plaque in the proximal LCx, <25% stenosis. Aorta: Normal size, 31 mm at the mid ascending aorta (level of the PA bifurcation) measured double  oblique. Aortic Valve: No calcifications.  Tricuspid aortic valve. Other findings: Normal pulmonary vein drainage into the left atrium. Normal left atrial appendage without thrombus. Normal size of the pulmonary artery. Please see separate report from [facility] for non-cardiac findings. IMPRESSION: 1. Minimal CAD, <25% stenosis, CADRADS 1. 2. Coronary calcium score of 0. 3. Normal coronary origins with right dominance. RECOMMENDATIONS: CAD-RADS 1. Minimal non-obstructive CAD (0-24%). Consider non-atherosclerotic causes of chest pain. Consider preventive therapy and risk factor modification. Electronically Signed   By: Cherlynn Kaiser M.D.   On: 03/24/2022 13:59   ECHOCARDIOGRAM COMPLETE  Result Date: 03/23/2022    ECHOCARDIOGRAM REPORT   Patient Name:   Arnelle Storti Date of Exam: 03/23/2022 Medical Rec #:  [id number]              Height:       62.0 in Accession #:    [id number]             Weight:       213.6  lb Date of Birth:  03-17-1975              BSA:          1.966 m Patient Age:    28 years               BP:           132/68 mmHg Patient Gender: F                      HR:           63 bpm. Exam Location:  Inpatient Procedure: 2D Echo, Color Doppler and Cardiac Doppler Indications:    Chest Pain R07.9  History:        Patient has no prior history of Echocardiogram examinations.                 Signs/Symptoms:Chest Pain; Risk Factors:Current Smoker.  Sonographer:    Greer Pickerel Referring Phys: Lequita Halt  Sonographer Comments: Image acquisition challenging due to patient body habitus and Image acquisition challenging due to respiratory motion. IMPRESSIONS  1. Left ventricular ejection fraction, by estimation, is 60 to 65%. The left ventricle has normal function. Left ventricular endocardial border not optimally defined to evaluate regional wall motion. Left ventricular diastolic parameters are consistent with Grade II diastolic dysfunction (pseudonormalization). Elevated left ventricular end-diastolic pressure.  2. Right ventricular systolic function is normal. The right ventricular size is normal. Tricuspid regurgitation signal is inadequate for assessing PA pressure.  3. Left atrial size was mildly dilated.  4. The mitral valve is normal in structure. No evidence of mitral valve regurgitation. No evidence of mitral stenosis.  5. The aortic valve is normal in structure. Aortic valve regurgitation is not visualized. No aortic stenosis is present. FINDINGS  Left Ventricle: Left ventricular ejection fraction, by estimation, is 60 to 65%. The left ventricle has normal function. Left ventricular endocardial border not optimally defined to evaluate regional wall motion. The left ventricular internal cavity size was normal in size. There is no left ventricular hypertrophy. Left ventricular diastolic parameters are consistent with Grade II diastolic dysfunction (pseudonormalization). Elevated left ventricular  end-diastolic pressure. Right Ventricle: The right ventricular size is normal. No increase in right ventricular wall thickness. Right ventricular systolic function is normal. Tricuspid regurgitation signal is inadequate for assessing PA pressure. Left Atrium: Left atrial size was mildly dilated. Right Atrium: Right atrial size was normal in size. Pericardium: There is  no evidence of pericardial effusion. Mitral Valve: The mitral valve is normal in structure. No evidence of mitral valve regurgitation. No evidence of mitral valve stenosis. Tricuspid Valve: The tricuspid valve is normal in structure. Tricuspid valve regurgitation is not demonstrated. No evidence of tricuspid stenosis. Aortic Valve: The aortic valve is normal in structure. Aortic valve regurgitation is not visualized. No aortic stenosis is present. Pulmonic Valve: The pulmonic valve was normal in structure. Pulmonic valve regurgitation is not visualized. No evidence of pulmonic stenosis. Aorta: The aortic root is normal in size and structure. Venous: The inferior vena cava was not well visualized. IAS/Shunts: No atrial level shunt detected by color flow Doppler.  LEFT VENTRICLE PLAX 2D LVIDd:         4.60 cm   Diastology LVIDs:         3.00 cm   LV e' medial:    5.55 cm/s LV PW:         1.00 cm   LV E/e' medial:  17.8 LV IVS:        0.90 cm   LV e' lateral:   9.03 cm/s LVOT diam:     1.90 cm   LV E/e' lateral: 11.0 LV SV:         79 LV SV Index:   40 LVOT Area:     2.84 cm  RIGHT VENTRICLE RV S prime:     12.90 cm/s TAPSE (M-mode): 2.2 cm LEFT ATRIUM             Index        RIGHT ATRIUM           Index LA diam:        2.90 cm 1.48 cm/m   RA Area:     18.80 cm LA Vol (A2C):   93.5 ml 47.56 ml/m  RA Volume:   52.00 ml  26.45 ml/m LA Vol (A4C):   63.6 ml 32.35 ml/m LA Biplane Vol: 77.2 ml 39.27 ml/m  AORTIC VALVE LVOT Vmax:   135.00 cm/s LVOT Vmean:  78.600 cm/s LVOT VTI:    0.278 m  AORTA Ao Root diam: 3.00 cm Ao Asc diam:  3.10 cm MITRAL VALVE MV  Area (PHT): 3.31 cm    SHUNTS MV Decel Time: 229 msec    Systemic VTI:  0.28 m MR Peak grad: 6.2 mmHg     Systemic Diam: 1.90 cm MR Vmax:      124.00 cm/s MV E velocity: 99.00 cm/s MV Dal Blew velocity: 78.10 cm/s MV E/Shakenya Stoneberg ratio:  1.27 Fransico Him MD Electronically signed by Fransico Him MD Signature Date/Time: 03/23/2022/5:02:18 PM    Final    DG Chest 2 View  Result Date: 03/23/2022 CLINICAL DATA:  Chest pain EXAM: CHEST - 2 VIEW COMPARISON:  CXR 07/29/21 FINDINGS: No pleural effusion. No pneumothorax. Unchanged cardiac and mediastinal contours. No radiographically apparent displaced rib fractures. Visualized upper abdomen is unremarkable. Degenerative changes of the bilateral AC joints. Vertebral body heights are maintained. IMPRESSION: No focal airspace opacity. Electronically Signed   By: Marin Roberts M.D.   On: 03/23/2022 10:46    Microbiology: No results found for this or any previous visit (from the past 240 hour(s)).   Labs: Basic Metabolic Panel: Recent Labs  Lab 03/23/22 1013  NA 135  K 3.7  CL 105  CO2 22  GLUCOSE 104*  BUN 10  CREATININE 0.58  CALCIUM 8.7*   Liver Function Tests: No results for input(s): "AST", "ALT", "ALKPHOS", "BILITOT", "  PROT", "ALBUMIN" in the last 168 hours. No results for input(s): "LIPASE", "AMYLASE" in the last 168 hours. No results for input(s): "AMMONIA" in the last 168 hours. CBC: Recent Labs  Lab 03/23/22 1013  WBC 10.1  HGB 11.4*  HCT 35.5*  MCV 82.2  PLT 278   Cardiac Enzymes: No results for input(s): "CKTOTAL", "CKMB", "CKMBINDEX", "TROPONINI" in the last 168 hours. BNP: BNP (last 3 results) No results for input(s): "BNP" in the last 8760 hours.  ProBNP (last 3 results) No results for input(s): "PROBNP" in the last 8760 hours.  CBG: No results for input(s): "GLUCAP" in the last 168 hours.     Signed:  Fayrene Helper MD.  [facility] 03/24/2022, 4:09 PM

## 2022-03-24 NOTE — Progress Notes (Unsigned)
Enrolled patient for a 14 day Zio XT monitor to be mailed to patients home  Dr Claiborne Billings to read

## 2022-03-24 NOTE — Progress Notes (Signed)
Mobility Specialist Progress Note:   03/24/22 1125  Orthostatic Lying   BP- Lying 132/70  Pulse- Lying 62  Orthostatic Sitting  BP- Sitting 135/79  Pulse- Sitting 78  Orthostatic Standing at 0 minutes  BP- Standing at 0 minutes (!) 131/93  Pulse- Standing at 0 minutes 86  Orthostatic Standing at 3 minutes  BP- Standing at 3 minutes 135/88  Pulse- Standing at 3 minutes 72  Mobility  Activity Stood at bedside  Level of Assistance Standby assist, set-up cues, supervision of patient - no hands on  Assistive Device None  Activity Response Tolerated well  Mobility Referral Yes  $Mobility charge 1 Mobility   Pt agreeable to mobility session. Required supervision to stand EOB, orthostatic BP's negative (as shown above). No c/o CP throughout. Pt back in bed with all needs met.  Nelta Numbers Mobility Specialist Please contact via SecureChat or  Rehab office at [phone]

## 2022-03-24 NOTE — Progress Notes (Addendum)
PROGRESS NOTE    Avo Voci  [id number] DOB: March 21, 1975 DOA: 03/23/2022 PCP: Ladell Pier, MD  Chief Complaint  Patient presents with   Loss of Consciousness   Chest Pain    Brief Narrative:   Elianys Greulich is Glenda Spelman 47 y.o. female with medical history significant of obesity, chronic back pain, chronic normocytic anemia, presented with repeated episodes of chest pains.   Assessment & Plan:   Principal Problem:   Chest pain  Chest Pain  Concern for Unstable Angina Started at least Dusan Lipford few weeks ago.  Substernal/left sided.  Lasts 10-15 min.  Worse with exertion.  Improved with nitro. CP this AM improved after nitro x3 Negative d dimer.  Negative troponin. Aspirin Nitro prn pain.  Morphine prn pain LDL 57.  A1c 5.7 Appreciate cardiology assistance Some CP with TTP, but CP episodes she notes are more severe  Syncope Episode of syncope following chest pain Zariah Cavendish few days ago while she was sitting down.  LOC 15 min. Echo, Orthostatics  Will need cardiac monitor at discharge  Chronic Pain Muscle relaxer Hold ibuprofen     DVT prophylaxis: lovenox Code Status: full Family Communication: daughter at bedside Disposition:   Status is: Observation The patient remains OBS appropriate and will d/c before 2 midnights.   Consultants:  cardiology  Procedures:  none  Antimicrobials:  Anti-infectives (From admission, onward)    None       Subjective: Discussed with patient with daughter (offered interpreter, they decliend) CP over past few weeks, substernal/left sided, 10-15 min in duration, pressure - worsened with activity.  Improved after nitro. CP this morning, improved after nitro x3 Episode of LOC following one of these episodes several days ago - 15 min LOC - family was told to call 911, but she came around first   Objective: Vitals:   03/24/22 0800 03/24/22 0822 03/24/22 0827 03/24/22 0837  BP: 131/84 137/82 131/85 133/80  Pulse:  65  74 73  Resp: 18     Temp:      TempSrc:      SpO2: 96%     Weight:      Height:       No intake or output data in the 24 hours ending 03/24/22 0857 Filed Weights   03/23/22 1353 03/23/22 1649  Weight: 96.9 kg 99.8 kg    Examination:  General exam: Appears calm and comfortable  Respiratory system: Clear to auscultation. Respiratory effort normal. Cardiovascular system: S1 & S2 heard, RRR.  Gastrointestinal system: Abdomen is nondistended, soft and nontender.  Central nervous system: Alert and oriented. No focal neurological deficits. Extremities: no LEE   Data Reviewed: I have personally reviewed following labs and imaging studies  CBC: Recent Labs  Lab 03/23/22 1013  WBC 10.1  HGB 11.4*  HCT 35.5*  MCV 82.2  PLT 0000000    Basic Metabolic Panel: Recent Labs  Lab 03/23/22 1013  NA 135  K 3.7  CL 105  CO2 22  GLUCOSE 104*  BUN 10  CREATININE 0.58  CALCIUM 8.7*    GFR: Estimated Creatinine Clearance: 98.1 mL/min (by C-G formula based on SCr of 0.58 mg/dL).  Liver Function Tests: No results for input(s): "AST", "ALT", "ALKPHOS", "BILITOT", "PROT", "ALBUMIN" in the last 168 hours.  CBG: No results for input(s): "GLUCAP" in the last 168 hours.   No results found for this or any previous visit (from the past 240 hour(s)).       Radiology Studies: ECHOCARDIOGRAM COMPLETE  Result  Date: 03/23/2022    ECHOCARDIOGRAM REPORT   Patient Name:   Tiffnay Saxena Date of Exam: 03/23/2022 Medical Rec #:  [id number]              Height:       62.0 in Accession #:    [id number]             Weight:       213.6 lb Date of Birth:  1975-07-05              BSA:          1.966 m Patient Age:    58 years               BP:           132/68 mmHg Patient Gender: F                      HR:           63 bpm. Exam Location:  Inpatient Procedure: 2D Echo, Color Doppler and Cardiac Doppler Indications:    Chest Pain R07.9  History:        Patient has no prior history of  Echocardiogram examinations.                 Signs/Symptoms:Chest Pain; Risk Factors:Current Smoker.  Sonographer:    Greer Pickerel Referring Phys: Lequita Halt  Sonographer Comments: Image acquisition challenging due to patient body habitus and Image acquisition challenging due to respiratory motion. IMPRESSIONS  1. Left ventricular ejection fraction, by estimation, is 60 to 65%. The left ventricle has normal function. Left ventricular endocardial border not optimally defined to evaluate regional wall motion. Left ventricular diastolic parameters are consistent with Grade II diastolic dysfunction (pseudonormalization). Elevated left ventricular end-diastolic pressure.  2. Right ventricular systolic function is normal. The right ventricular size is normal. Tricuspid regurgitation signal is inadequate for assessing PA pressure.  3. Left atrial size was mildly dilated.  4. The mitral valve is normal in structure. No evidence of mitral valve regurgitation. No evidence of mitral stenosis.  5. The aortic valve is normal in structure. Aortic valve regurgitation is not visualized. No aortic stenosis is present. FINDINGS  Left Ventricle: Left ventricular ejection fraction, by estimation, is 60 to 65%. The left ventricle has normal function. Left ventricular endocardial border not optimally defined to evaluate regional wall motion. The left ventricular internal cavity size was normal in size. There is no left ventricular hypertrophy. Left ventricular diastolic parameters are consistent with Grade II diastolic dysfunction (pseudonormalization). Elevated left ventricular end-diastolic pressure. Right Ventricle: The right ventricular size is normal. No increase in right ventricular wall thickness. Right ventricular systolic function is normal. Tricuspid regurgitation signal is inadequate for assessing PA pressure. Left Atrium: Left atrial size was mildly dilated. Right Atrium: Right atrial size was normal in size. Pericardium:  There is no evidence of pericardial effusion. Mitral Valve: The mitral valve is normal in structure. No evidence of mitral valve regurgitation. No evidence of mitral valve stenosis. Tricuspid Valve: The tricuspid valve is normal in structure. Tricuspid valve regurgitation is not demonstrated. No evidence of tricuspid stenosis. Aortic Valve: The aortic valve is normal in structure. Aortic valve regurgitation is not visualized. No aortic stenosis is present. Pulmonic Valve: The pulmonic valve was normal in structure. Pulmonic valve regurgitation is not visualized. No evidence of pulmonic stenosis. Aorta: The aortic root is normal in size and structure. Venous: The inferior vena cava was  not well visualized. IAS/Shunts: No atrial level shunt detected by color flow Doppler.  LEFT VENTRICLE PLAX 2D LVIDd:         4.60 cm   Diastology LVIDs:         3.00 cm   LV e' medial:    5.55 cm/s LV PW:         1.00 cm   LV E/e' medial:  17.8 LV IVS:        0.90 cm   LV e' lateral:   9.03 cm/s LVOT diam:     1.90 cm   LV E/e' lateral: 11.0 LV SV:         79 LV SV Index:   40 LVOT Area:     2.84 cm  RIGHT VENTRICLE RV S prime:     12.90 cm/s TAPSE (M-mode): 2.2 cm LEFT ATRIUM             Index        RIGHT ATRIUM           Index LA diam:        2.90 cm 1.48 cm/m   RA Area:     18.80 cm LA Vol (A2C):   93.5 ml 47.56 ml/m  RA Volume:   52.00 ml  26.45 ml/m LA Vol (A4C):   63.6 ml 32.35 ml/m LA Biplane Vol: 77.2 ml 39.27 ml/m  AORTIC VALVE LVOT Vmax:   135.00 cm/s LVOT Vmean:  78.600 cm/s LVOT VTI:    0.278 m  AORTA Ao Root diam: 3.00 cm Ao Asc diam:  3.10 cm MITRAL VALVE MV Area (PHT): 3.31 cm    SHUNTS MV Decel Time: 229 msec    Systemic VTI:  0.28 m MR Peak grad: 6.2 mmHg     Systemic Diam: 1.90 cm MR Vmax:      124.00 cm/s MV E velocity: 99.00 cm/s MV Josyah Achor velocity: 78.10 cm/s MV E/Shanetha Bradham ratio:  1.27 Fransico Him MD Electronically signed by Fransico Him MD Signature Date/Time: 03/23/2022/5:02:18 PM    Final    DG Chest 2  View  Result Date: 03/23/2022 CLINICAL DATA:  Chest pain EXAM: CHEST - 2 VIEW COMPARISON:  CXR 07/29/21 FINDINGS: No pleural effusion. No pneumothorax. Unchanged cardiac and mediastinal contours. No radiographically apparent displaced rib fractures. Visualized upper abdomen is unremarkable. Degenerative changes of the bilateral AC joints. Vertebral body heights are maintained. IMPRESSION: No focal airspace opacity. Electronically Signed   By: Marin Roberts M.D.   On: 03/23/2022 10:46        Scheduled Meds:  aspirin EC  81 mg Oral Daily   enoxaparin (LOVENOX) injection  40 mg Subcutaneous Q24H   Continuous Infusions:   LOS: 0 days    Time spent: over 30 min    Fayrene Helper, MD [facility]   To contact the attending provider between 7A-7P or the covering provider during after hours 7P-7A, please log into the web site [email or web address] and access using universal [place] password for that web site. If you do not have the password, please call the hospital operator.  03/24/2022, 8:57 AM

## 2022-03-24 NOTE — Consult Note (Addendum)
Cardiology Consultation   Patient ID: Katherine [name] MRN: [id number]; DOB: [birth year]  Admit date: 03/23/2022 Date of Consult: 03/24/2022  PCP:  Ladell Pier, MD   [place] [facility] Cardiologist:  Shelva Majestic, MD   Patient Profile:   Katherine [name] is a 47 y.o. female with a hx of HFpEF and obesity who is being seen 03/24/2022 for the evaluation of chest pain at the request of Dr. Florene Glen.  History of Present Illness:   Katherine [name] has no prior cardiac history. She presented to [facility] with a 2 day history of chest pan concerning for angina. EKG was nonischemic and HS troponin remained negative. Echo showed preserved EF with grade 2 DD, wall motion could not be adequately evaluated. Cardiology was consulted for possible ischemic evaluation. She intermittently smokes cigarettes.   During my interview, she reports intermittent chest pain for the last 4 months, no memory of an injury. She has been seen my a provider in the past and was told this was MSK in nature. She denies prior cardiac history. CP is sometimes partially relieved with tylenol. Yesterday she was eating soup and had sudden 10/10 chest pain with shortness of breath and passed out with LOC for about 15 minutes, per family who witnessed the event. She has never passed out before. She states the CP feels like a pressure and waxes and wanes. Her last bout of severe 10/10 chest pain was this morning, she has mild pressure currently. She reports no cigarette smoking. She does not work outside the home, but does do moderate house chores and walks grocery store aisles without chest pain.   She denies family history of heart problems.    Past Medical History:  Diagnosis Date   Abdominal pain    Anemia    Depression    postpartum depression   Morbid obesity ([place])    Splenomegaly     Past Surgical History:  Procedure Laterality Date   nexplanon     NO PAST SURGERIES     ROBOTIC ASSISTED  LAPAROSCOPIC VENTRAL/INCISIONAL HERNIA REPAIR N/A 01/04/2018   Procedure: ROBOTIC ASSISTED LAPAROSCOPIC VENTRAL/INCISIONAL HERNIA REPAIR;  Surgeon: Jules Husbands, MD;  Location: [facility] ORS;  Service: General;  Laterality: N/A;   ROBOTIC ASSISTED SALPINGO OOPHERECTOMY Bilateral 01/04/2018   Procedure: ROBOTIC ASSISTED SALPINGECTOMY;  Surgeon: Malachy Mood, MD;  Location: [facility] ORS;  Service: Gynecology;  Laterality: Bilateral;   TUBAL LIGATION     UPPER GI ENDOSCOPY  2015     Home Medications:  Prior to Admission medications   Medication Sig Start Date End Date Taking? Authorizing Provider  cyclobenzaprine (FLEXERIL) 5 MG tablet Take 1 tablet (5 mg total) by mouth daily as needed for muscle spasms. 11/14/21  Yes Ladell Pier, MD  ibuprofen (ADVIL) 600 MG tablet Take 1 tablet (600 mg total) by mouth every 6 (six) hours as needed (Take with food). Patient taking differently: Take 600 mg by mouth daily as needed (Take with food). 11/14/21  Yes Ladell Pier, MD  fluticasone (FLONASE) 50 MCG/ACT nasal spray PLACE 2 SPRAYS INTO BOTH NOSTRILS DAILY AS NEEDED Patient not taking: Reported on 03/23/2022 11/09/19 11/28/20  Antony Blackbird, MD    Inpatient Medications: Scheduled Meds:  aspirin EC  81 mg Oral Daily   enoxaparin (LOVENOX) injection  40 mg Subcutaneous Q24H   Continuous Infusions:  PRN Meds: acetaminophen, cyclobenzaprine, morphine injection, nitroGLYCERIN, ondansetron (ZOFRAN) IV  Allergies:   No Known Allergies  Social History:   Social History  Socioeconomic History   Marital status: Single    Spouse name: Not on file   Number of children: 5   Years of education: Not on file   Highest education level: Not on file  Occupational History   Not on file  Tobacco Use   Smoking status: Some Days    Types: Cigarettes   Smokeless tobacco: Never  Vaping Use   Vaping Use: Never used  Substance and Sexual Activity   Alcohol use: Yes   Drug use: No   Sexual  activity: Yes    Birth control/protection: Surgical  Other Topics Concern   Not on file  Social History Narrative   Not on file   Social Determinants of Health   Financial Resource Strain: Not on file  Food Insecurity: No Food Insecurity (05/15/2021)   Hunger Vital Sign    Worried About Running Out of Food in the Last Year: Never true    Ran Out of Food in the Last Year: Never true  Transportation Needs: No Transportation Needs (05/15/2021)   PRAPARE - Hydrologist (Medical): No    Lack of Transportation (Non-Medical): No  Physical Activity: Not on file  Stress: Not on file  Social Connections: Not on file  Intimate Partner Violence: Not on file    Family History:    Family History  Problem Relation Age of Onset   Diabetes Father    Kidney disease Brother      ROS:  Please see the history of present illness.   All other ROS reviewed and negative.     Physical Exam/Data:   Vitals:   03/24/22 0800 03/24/22 0822 03/24/22 0827 03/24/22 0837  BP: 131/84 137/82 131/85 133/80  Pulse: 65  74 73  Resp: 18     Temp:      TempSrc:      SpO2: 96%     Weight:      Height:       No intake or output data in the 24 hours ending 03/24/22 1020    03/23/2022    4:49 PM 03/23/2022    1:53 PM 11/14/2021    3:11 PM  Last 3 Weights  Weight (lbs) 220 lb 0.3 oz 213 lb 10 oz 214 lb 3.2 oz  Weight (kg) 99.8 kg 96.9 kg 97.16 kg     Body mass index is 40.24 kg/m.  General:  Well nourished, well developed, in no acute distress HEENT: normal Neck: no JVD Vascular: No carotid bruits; Distal pulses 2+ bilaterally Cardiac:  normal S1, S2; RRR; no murmur  Lungs:  clear to auscultation bilaterally, no wheezing, rhonchi or rales  Abd: soft, nontender, no hepatomegaly  Ext: no edema Musculoskeletal:  No deformities, BUE and BLE strength normal and equal Skin: warm and dry  Neuro:  CNs 2-12 intact, no focal abnormalities noted Psych:  Normal affect   EKG:   The EKG was personally reviewed and demonstrates:  sinus rhythm with HR 73 Telemetry:  Telemetry was personally reviewed and demonstrates:  sinus to sinus bradycardia HR 50-70s  Relevant CV Studies:  Echo 03/23/22:  1. Left ventricular ejection fraction, by estimation, is 60 to 65%. The  left ventricle has normal function. Left ventricular endocardial border  not optimally defined to evaluate regional wall motion. Left ventricular  diastolic parameters are consistent  with Grade II diastolic dysfunction (pseudonormalization). Elevated left  ventricular end-diastolic pressure.   2. Right ventricular systolic function is normal. The right ventricular  size is normal. Tricuspid regurgitation signal is inadequate for assessing  PA pressure.   3. Left atrial size was mildly dilated.   4. The mitral valve is normal in structure. No evidence of mitral valve  regurgitation. No evidence of mitral stenosis.   5. The aortic valve is normal in structure. Aortic valve regurgitation is  not visualized. No aortic stenosis is present.   Laboratory Data:  High Sensitivity Troponin:   Recent Labs  Lab 03/23/22 1013 03/23/22 1208 03/24/22 0204  TROPONINIHS '4 3 4     '$ Chemistry Recent Labs  Lab 03/23/22 1013  NA 135  K 3.7  CL 105  CO2 22  GLUCOSE 104*  BUN 10  CREATININE 0.58  CALCIUM 8.7*  GFRNONAA >60  ANIONGAP 8    No results for input(s): "PROT", "ALBUMIN", "AST", "ALT", "ALKPHOS", "BILITOT" in the last 168 hours. Lipids  Recent Labs  Lab 03/23/22 1013  CHOL 139  TRIG 189*  HDL 44  LDLCALC 57  CHOLHDL 3.2    Hematology Recent Labs  Lab 03/23/22 1013  WBC 10.1  RBC 4.32  HGB 11.4*  HCT 35.5*  MCV 82.2  MCH 26.4  MCHC 32.1  RDW 13.2  PLT 278   Thyroid  Recent Labs  Lab 03/23/22 1013  TSH 2.049    BNPNo results for input(s): "BNP", "PROBNP" in the last 168 hours.  DDimer  Recent Labs  Lab 03/23/22 1013  DDIMER <0.27     Radiology/Studies:   ECHOCARDIOGRAM COMPLETE  Result Date: 03/23/2022    ECHOCARDIOGRAM REPORT   Patient Name:   Mandisa Springett Date of Exam: 03/23/2022 Medical Rec #:  [id number]              Height:       62.0 in Accession #:    [id number]             Weight:       213.6 lb Date of Birth:  02-20-1975              BSA:          1.966 m Patient Age:    29 years               BP:           132/68 mmHg Patient Gender: F                      HR:           63 bpm. Exam Location:  Inpatient Procedure: 2D Echo, Color Doppler and Cardiac Doppler Indications:    Chest Pain R07.9  History:        Patient has no prior history of Echocardiogram examinations.                 Signs/Symptoms:Chest Pain; Risk Factors:Current Smoker.  Sonographer:    Greer Pickerel Referring Phys: Lequita Halt  Sonographer Comments: Image acquisition challenging due to patient body habitus and Image acquisition challenging due to respiratory motion. IMPRESSIONS  1. Left ventricular ejection fraction, by estimation, is 60 to 65%. The left ventricle has normal function. Left ventricular endocardial border not optimally defined to evaluate regional wall motion. Left ventricular diastolic parameters are consistent with Grade II diastolic dysfunction (pseudonormalization). Elevated left ventricular end-diastolic pressure.  2. Right ventricular systolic function is normal. The right ventricular size is normal. Tricuspid regurgitation signal is inadequate for assessing PA pressure.  3. Left atrial size was mildly  dilated.  4. The mitral valve is normal in structure. No evidence of mitral valve regurgitation. No evidence of mitral stenosis.  5. The aortic valve is normal in structure. Aortic valve regurgitation is not visualized. No aortic stenosis is present. FINDINGS  Left Ventricle: Left ventricular ejection fraction, by estimation, is 60 to 65%. The left ventricle has normal function. Left ventricular endocardial border not optimally defined to evaluate regional  wall motion. The left ventricular internal cavity size was normal in size. There is no left ventricular hypertrophy. Left ventricular diastolic parameters are consistent with Grade II diastolic dysfunction (pseudonormalization). Elevated left ventricular end-diastolic pressure. Right Ventricle: The right ventricular size is normal. No increase in right ventricular wall thickness. Right ventricular systolic function is normal. Tricuspid regurgitation signal is inadequate for assessing PA pressure. Left Atrium: Left atrial size was mildly dilated. Right Atrium: Right atrial size was normal in size. Pericardium: There is no evidence of pericardial effusion. Mitral Valve: The mitral valve is normal in structure. No evidence of mitral valve regurgitation. No evidence of mitral valve stenosis. Tricuspid Valve: The tricuspid valve is normal in structure. Tricuspid valve regurgitation is not demonstrated. No evidence of tricuspid stenosis. Aortic Valve: The aortic valve is normal in structure. Aortic valve regurgitation is not visualized. No aortic stenosis is present. Pulmonic Valve: The pulmonic valve was normal in structure. Pulmonic valve regurgitation is not visualized. No evidence of pulmonic stenosis. Aorta: The aortic root is normal in size and structure. Venous: The inferior vena cava was not well visualized. IAS/Shunts: No atrial level shunt detected by color flow Doppler.  LEFT VENTRICLE PLAX 2D LVIDd:         4.60 cm   Diastology LVIDs:         3.00 cm   LV e' medial:    5.55 cm/s LV PW:         1.00 cm   LV E/e' medial:  17.8 LV IVS:        0.90 cm   LV e' lateral:   9.03 cm/s LVOT diam:     1.90 cm   LV E/e' lateral: 11.0 LV SV:         79 LV SV Index:   40 LVOT Area:     2.84 cm  RIGHT VENTRICLE RV S prime:     12.90 cm/s TAPSE (M-mode): 2.2 cm LEFT ATRIUM             Index        RIGHT ATRIUM           Index LA diam:        2.90 cm 1.48 cm/m   RA Area:     18.80 cm LA Vol (A2C):   93.5 ml 47.56 ml/m  RA  Volume:   52.00 ml  26.45 ml/m LA Vol (A4C):   63.6 ml 32.35 ml/m LA Biplane Vol: 77.2 ml 39.27 ml/m  AORTIC VALVE LVOT Vmax:   135.00 cm/s LVOT Vmean:  78.600 cm/s LVOT VTI:    0.278 m  AORTA Ao Root diam: 3.00 cm Ao Asc diam:  3.10 cm MITRAL VALVE MV Area (PHT): 3.31 cm    SHUNTS MV Decel Time: 229 msec    Systemic VTI:  0.28 m MR Peak grad: 6.2 mmHg     Systemic Diam: 1.90 cm MR Vmax:      124.00 cm/s MV E velocity: 99.00 cm/s MV A velocity: 78.10 cm/s MV E/A ratio:  1.27 Fransico Him MD Electronically signed  by Fransico Him MD Signature Date/Time: 03/23/2022/5:02:18 PM    Final    DG Chest 2 View  Result Date: 03/23/2022 CLINICAL DATA:  Chest pain EXAM: CHEST - 2 VIEW COMPARISON:  CXR 07/29/21 FINDINGS: No pleural effusion. No pneumothorax. Unchanged cardiac and mediastinal contours. No radiographically apparent displaced rib fractures. Visualized upper abdomen is unremarkable. Degenerative changes of the bilateral AC joints. Vertebral body heights are maintained. IMPRESSION: No focal airspace opacity. Electronically Signed   By: Marin Roberts M.D.   On: 03/23/2022 10:46     Assessment and Plan:   Chest pain Both typical and atypical features present. Given syncope, I think we should rule out ischemia.  - HS troponin x 3 negative, 4th pending - D-dimer negative - EKG appears nonischemic - given symptoms with syncope, will obtain CT coronary today   Chronic diastolic dysfunction without heart failure Appears euvolemic on exam Question untreated HTN BP generally running in the [id number] systolic  Does not appear volume up on exam   Syncope TSH WNL, A1c 5.7% Possibly vasovagal as she had sudden severe 10/10 chest pain If CT coronary without obstructive disease, will plan for 14 day zio patch to rule out pause, AV block, and arrhythmia   Hypertriglyceridemia  03/23/2022: Cholesterol 139; HDL 44; LDL Cholesterol 57; Triglycerides 189; VLDL 38 Work on diet and exercise as  tolerated   Snoring Obesity Plan for OP sleep study, high likelihood of OSA   Risk Assessment/Risk Scores:     TIMI Risk Score for Unstable Angina or Non-ST Elevation MI:   The patient's TIMI risk score is 1, which indicates a 5% risk of all cause mortality, new or recurrent myocardial infarction or need for urgent revascularization in the next 14 days.          For questions or updates, please contact [place] Please consult [email or web address] for contact info under    Signed, Ledora Bottcher, [place]  03/24/2022 10:20 AM   Patient seen and examined. Agree with assessment and plan.  Katherine [name] is a 47 year old female from [place] who has a history of obesity and has recently experienced chest wall discomfort.  She describes her pain as a pressure sensation, located over the costochondral region.  It does not exacerbate with walking.  She notes it more at rest.  She recently had an apparent syncopal spell number she was down for approximately 15 minutes.  She was admitted to the hospital.  Cardiac enzymes are negative; D-dimer is normal.  Laboratory is notable for mild hypertriglyceridemia with triglycerides at 189.  A 2D echo Doppler study showed normal systolic function with grade 2 diastolic dysfunction.  Her ECG is normal.  Exam she is obese and in no acute distress.  HEENT is notable for Mallampati scale of 4.  She has thick neck.'s are clear.  There was definite chest wall tenderness to palpation over the left costochondral region.  Mimicked her chest pain.  Rhythm was regular without ectopy.  There was a faint 1/6 systolic murmur.  She had central adiposity.  Pulses were 2+.  There is no clubbing cyanosis or edema.  Neurologic exam was grossly nonfocal.  My suspicion is that the patient has musculoskeletal chest pain most likely costochondral in etiology.  Will plan for her to undergo coronary CTA today for evaluation of her coronary anatomy, calcium score  potential for luminal stenosis.  With her recent syncope, we will plan for her to have an outpatient Zio patch monitor.  Physical  exam is highly suggestive of obstructive sleep apnea.  She admits to snoring.  Her sleep is nonrestorative.  Will ultimately plan to schedule for outpatient sleep study following discharge.   Troy Sine, MD, [facility] 03/24/2022 10:55 AM

## 2022-03-25 ENCOUNTER — Telehealth: Payer: Self-pay

## 2022-03-25 DIAGNOSIS — R55 Syncope and collapse: Secondary | ICD-10-CM

## 2022-03-25 NOTE — Transitions of Care (Post Inpatient/ED Visit) (Signed)
   03/25/2022  Name: Katherine [name] MRN: [id number] DOB: 1976-01-16  Today's TOC FU Call Status: Today's TOC FU Call Status:: Unsuccessul Call (1st Attempt) Unsuccessful Call (1st Attempt) Date: 03/25/22  Call placed with assistance of Spanish Interpreter: 34/[employer]. Left message with call back requested  Attempted to reach the patient regarding the most recent Inpatient/ED visit.  Follow Up Plan: Additional outreach attempts will be made to reach the patient to complete the Transitions of Care (Post Inpatient/ED visit) call.   Signature Eden Lathe, RN

## 2022-03-26 ENCOUNTER — Telehealth: Payer: Self-pay

## 2022-03-26 NOTE — Transitions of Care (Post Inpatient/ED Visit) (Signed)
   03/26/2022  Name: Katherine [name] MRN: [id number] DOB: [birth year]  Today's TOC FU Call Status: Today's TOC FU Call Status:: Unsuccessful Call (2nd Attempt) Unsuccessful Call (1st Attempt) Date: 03/25/22 Unsuccessful Call (2nd Attempt) Date: 03/26/22  Attempted to reach the patient regarding the most recent Inpatient/ED visit.  Call placed with assistance of Spanish Interpreter: [employer]. Left message with call back requested   Follow Up Plan: Additional outreach attempts will be made to reach the patient to complete the Transitions of Care (Post Inpatient/ED visit) call.   Signature Eden Lathe, RN

## 2022-03-30 ENCOUNTER — Telehealth: Payer: Self-pay

## 2022-03-30 NOTE — Transitions of Care (Post Inpatient/ED Visit) (Signed)
   03/30/2022  Name: Katherine [name] MRN: [id number] DOB: 03/09/1975  Today's TOC FU Call Status: Today's TOC FU Call Status:: Unsuccessful Call (3rd Attempt) Unsuccessful Call (1st Attempt) Date: 03/25/22 Unsuccessful Call (2nd Attempt) Date: 03/26/22 Unsuccessful Call (3rd Attempt) Date: 03/30/22  Attempted to reach the patient regarding the most recent Inpatient/ED visit.  Call placed with assistance of Spanish interpreter [phone]/[employer]  Follow Up Plan: No further outreach attempts will be made at this time. We have been unable to contact the patient.  Signature Eden Lathe, RN

## 2022-03-30 NOTE — Telephone Encounter (Signed)
-----   Message from Ledora Bottcher, [place] sent at 03/24/2022  2:16 PM EST ----- Ordered a 14 day monitor for syncope. Will need follow up with Dr. Claiborne Billings or APP in 4-5  weeks -can be with me, I saw her in the hospital. [place] interpreter.  Can you help arrange appt?  Thanks Angie

## 2022-04-16 NOTE — Telephone Encounter (Signed)
Appt scheduled 05-26-22

## 2022-05-26 ENCOUNTER — Ambulatory Visit: Payer: Self-pay | Admitting: Physician Assistant

## 2022-06-29 ENCOUNTER — Telehealth: Payer: Self-pay

## 2022-06-29 NOTE — Telephone Encounter (Signed)
Spoke with an interpretor. The interpretor called and left a VM  to discuss monitor results with pt. Will call pt back with interpretor.

## 2022-07-09 NOTE — Telephone Encounter (Signed)
Spoke with pt using an interpretor. Pt was notified of monitor results and will discuss further at pts next office visit.  Interpretor number [phone]

## 2022-07-11 NOTE — Progress Notes (Unsigned)
Cardiology Office Note:    Date:  07/14/2022   ID:  Katherine [name], DOB January 20, 1976, MRN [id number]  PCP:  Marcine Matar, MD   [facility] Providers Cardiologist:  Nicki Guadalajara, MD Cardiology APP:  Marcelino Duster, [place]     Referring MD: Marcine Matar, MD   Chief Complaint  Patient presents with   Follow-up    syncope    History of Present Illness:    Katherine [name] is a 47 y.o. female with a hx of HFpEF and obesity.  She was initially seen by cardiology during that admission in February 2024 for chest pain concerning for angina.  EKG was nonischemic and cardiac enzymes remain negative.  Echocardiogram showed preserved EF and grade 2 DD, wall motion could not be adequately evaluated.  She intermittently smokes cigarettes.  She was previously told her chest pain was MSK in nature, partially relieved by Tylenol.  The day before admission she was eating soup and had sudden 10 out of 10 chest pain with shortness of breath and syncope with LOC for about 15 minutes, per witness.  CT coronary was obtained and showed minimal CAD of less than 25% stenosis and a coronary calcium score of 0.  Given syncope, a 14-day heart monitor was obtained and was largely unrevealing.   She presents today for follow-up.  She continues to have MSK back pain. She is sedentary.  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  She does have issues with swelling after eating a high sodium meal.  She denies shortness of breath and orthopnea.  She appears euvolemic on exam today.  Past Medical History:  Diagnosis Date   Abdominal pain    Anemia    Depression    postpartum depression   Morbid obesity (HCC)    Splenomegaly     Past Surgical History:  Procedure Laterality Date   nexplanon     NO PAST SURGERIES     ROBOTIC ASSISTED LAPAROSCOPIC VENTRAL/INCISIONAL HERNIA REPAIR N/A 01/04/2018   Procedure: ROBOTIC ASSISTED LAPAROSCOPIC VENTRAL/INCISIONAL  HERNIA REPAIR;  Surgeon: Leafy Ro, MD;  Location: [facility] ORS;  Service: General;  Laterality: N/A;   ROBOTIC ASSISTED SALPINGO OOPHERECTOMY Bilateral 01/04/2018   Procedure: ROBOTIC ASSISTED SALPINGECTOMY;  Surgeon: Vena Austria, MD;  Location: [facility] ORS;  Service: Gynecology;  Laterality: Bilateral;   TUBAL LIGATION     UPPER GI ENDOSCOPY  2015    Current Medications: No outpatient medications have been marked as taking for the 07/14/22 encounter (Office Visit) with Marcelino Duster, PA.     Allergies:   Patient has no known allergies.   Social History   Socioeconomic History   Marital status: Single    Spouse name: Not on file   Number of children: 5   Years of education: Not on file   Highest education level: Not on file  Occupational History   Not on file  Tobacco Use   Smoking status: Former    Types: Cigarettes    Quit date: 01/2015    Years since quitting: 7.4   Smokeless tobacco: Never  Vaping Use   Vaping Use: Never used  Substance and Sexual Activity   Alcohol use: Yes   Drug use: No   Sexual activity: Yes    Birth control/protection: Surgical  Other Topics Concern   Not on file  Social History Narrative   Not on file   Social Determinants of Health   Financial Resource Strain: Not on file  Food Insecurity: No Food Insecurity (05/15/2021)   Hunger Vital Sign    Worried About Running Out of Food in the Last Year: Never true    Ran Out of Food in the Last Year: Never true  Transportation Needs: No Transportation Needs (05/15/2021)   PRAPARE - Administrator, Civil Service (Medical): No    Lack of Transportation (Non-Medical): No  Physical Activity: Not on file  Stress: Not on file  Social Connections: Not on file     Family History: The patient's family history includes Diabetes in her father; Kidney disease in her brother.  ROS:   Please see the history of present illness.     All other systems reviewed and are  negative.  EKGs/Labs/Other Studies Reviewed:    The following studies were reviewed today:  Heart monitor for syncope 04/15/22: Patient had a min HR of 45 bpm, max HR of 157 bpm, and avg HR of 76 bpm. Predominant underlying rhythm was Sinus Rhythm. Isolated SVEs were rare (<1.0%), and no SVE Couplets or SVE Triplets were present. Isolated VEs were rare (<1.0%), VE Couplets were  rare (<1.0%), and no VE Triplets were present. Ventricular Bigeminy was present.    Patient maintained sinus rhythm throughout the monitor with an average rate at 77 bpm.  Slowest sinus rhythm was 45 bpm which occurred at 8:02 AM on 3/10 and the fastest sinus rhythm was sinus tachycardia at 157 beats a minute which occurred at 12:26 AM on 3/03.  PACs and PVCs were rare.  There were no episodes of PAT/SVT, atrial fibrillation, or pauses   CT coronary 03/24/22: IMPRESSION: 1. Minimal CAD, <25% stenosis, CADRADS 1. 2. Coronary calcium score of 0. 3. Normal coronary origins with right dominance.   Echo 03/23/22: 1. Left ventricular ejection fraction, by estimation, is 60 to 65%. The  left ventricle has normal function. Left ventricular endocardial border  not optimally defined to evaluate regional wall motion. Left ventricular  diastolic parameters are consistent  with Grade II diastolic dysfunction (pseudonormalization). Elevated left  ventricular end-diastolic pressure.   2. Right ventricular systolic function is normal. The right ventricular  size is normal. Tricuspid regurgitation signal is inadequate for assessing  PA pressure.   3. Left atrial size was mildly dilated.   4. The mitral valve is normal in structure. No evidence of mitral valve  regurgitation. No evidence of mitral stenosis.   5. The aortic valve is normal in structure. Aortic valve regurgitation is  not visualized. No aortic stenosis is present.   EKG:  EKG is not ordered today.    Recent Labs: 03/23/2022: BUN 10; Creatinine, Ser 0.58;  Hemoglobin 11.4; Platelets 278; Potassium 3.7; Sodium 135; TSH 2.049  Recent Lipid Panel    Component Value Date/Time   CHOL 139 03/23/2022 1013   TRIG 189 (H) 03/23/2022 1013   HDL 44 03/23/2022 1013   CHOLHDL 3.2 03/23/2022 1013   VLDL 38 03/23/2022 1013   LDLCALC 57 03/23/2022 1013     Risk Assessment/Calculations:                Physical Exam:    VS:  BP 118/70 (BP Location: Right Arm, Patient Position: Sitting, Cuff Size: Large)   Pulse 71   Ht 5\' 2"  (1.575 m)   Wt 232 lb 3.2 oz (105.3 kg)   SpO2 96%   BMI 42.47 kg/m     Wt Readings from Last 3 Encounters:  07/14/22 232 lb 3.2 oz (105.3 kg)  03/23/22 220 lb 0.3 oz (99.8 kg)  11/14/21 214 lb 3.2 oz (97.2 kg)     GEN:  Well nourished, well developed in no acute distress HEENT: Normal NECK: No JVD; No carotid bruits LYMPHATICS: No lymphadenopathy CARDIAC: RRR, no murmurs, rubs, gallops RESPIRATORY:  Clear to auscultation without rales, wheezing or rhonchi  ABDOMEN: Soft, non-tender, non-distended MUSCULOSKELETAL:  No edema; No deformity  SKIN: Warm and dry NEUROLOGIC:  Alert and oriented x 3 PSYCHIATRIC:  Normal affect   ASSESSMENT:    1. Chronic heart failure with preserved ejection fraction (HCC)   2. Prediabetes   3. Smoking   4. Syncope and collapse   5. Coronary artery disease involving native coronary artery of native heart without angina pectoris    PLAN:    In order of problems listed above:  HFpEF Grade 2 diastolic dysfunction on recent echocardiogram.  We had a long discussion regarding sodium restriction of 2000 mg daily, how to read food labels, and when to call our office for symptoms of hypervolemia.   Prediabetes A1c 5.7% We discussed increasing her activity to begin with walking 10 minutes 3 times daily after meals.   Intermittent smoking Encouraged complete cessation   Syncope Question if this was vasovagal in nature since it was immediately preceded by 10 out of 10 chest  pain Unrevealing heart monitor No driving for 6 months from event (Feb 2024) No further episodes   Nonobstructive CAD By CT coronary 02/2022 Risk factor modification 03/23/2022: Cholesterol 139; HDL 44; LDL Cholesterol 57; Triglycerides 189; VLDL 38   Follow up in 1 year.    Medication Adjustments/Labs and Tests Ordered: Current medicines are reviewed at length with the patient today.  Concerns regarding medicines are outlined above.  No orders of the defined types were placed in this encounter.  No orders of the defined types were placed in this encounter.   Patient Instructions  Medication Instructions:   Your physician recommends that you continue on your current medications as directed. Please refer to the Current Medication list given to you today.  *If you need a refill on your cardiac medications before your next appointment, please call your pharmacy*  Lab Work: NONE ordered at this time of appointment   If you have labs (blood work) drawn today and your tests are completely normal, you will receive your results only by: MyChart Message (if you have MyChart) OR A paper copy in the mail If you have any lab test that is abnormal or we need to change your treatment, we will call you to review the results.  Testing/Procedures: NONE ordered at this time of appointment   Follow-Up: At [facility], you and your health needs are our priority.  As part of our continuing mission to provide you with exceptional heart care, we have created designated Provider Care Teams.  These Care Teams include your primary Cardiologist (physician) and Advanced Practice Providers (APPs -  Physician Assistants and Nurse Practitioners) who all work together to provide you with the care you need, when you need it.  Your next appointment:   1 year(s)  Provider:   Nicki Guadalajara, MD  or Micah Flesher, PA-C        Other Instructions Walk for 10 minutes after meals 3 times a day You may  start back driving in August         Cmo aumentar la cantidad de actividad fsica que realiza  How to Increase Your Level of Physical Activity Realizar [employer]  fsica de forma regular es importante para la salud general y Counsellor. La Harley[employer]Davidson de las personas no realizan suficiente actividad fsica. Hay muchas formas sencillas de aumentar la cantidad de actividad fsica que realiza, incluso si no ha estado muy activo en el pasado o si recin est comenzando. Cules son los beneficios de la actividad fsica? La actividad fsica tiene muchos beneficios tanto a corto como a Air cabin crew. Practicar actividad fsica de [place] regular puede mejorar la salud mental y fsica, as como proporcionar otros beneficios. Beneficios para la salud fsica Ayudar a Geophysical data processor o a Technical brewer peso saludable. Fortalecer los [employer] y los huesos. Disminuir el riesgo de padecer ciertas enfermedades a largo plazo (crnicas), como enfermedades cardacas, cncer y diabetes. Ser capaz de moverse con mayor facilidad y por perodos ms prolongados sin cansarse (mayor resistencia o estamina). Mejorar la capacidad para combatir enfermedades (aumento de la inmunidad). Dormir mejor. Ayudarlo a mantenerse sano a medida que envejece, lo que incluye: Ayudarlo a Photographer movilidad y la capacidad de caminar y Programmer, systems. Prevenir accidentes, como las cadas. Aumentar la expectativa de vida. Beneficios para la salud mental Mejorar el estado de nimo y [place]. Disminuir la posibilidad de experimentar problemas de salud mental, como ansiedad o depresin. Ayudar a que se sienta bien con su cuerpo. Otros beneficios Encontrar nuevas fuentes de diversin y entretenimiento. Conocer a Economist que comparten un inters comn. Antes de comenzar Si tiene una enfermedad crnica o no ha [place] durante un [place], consulte con un mdico antes de Games developer. Pregntele al mdico qu actividades son seguras  para usted. Comience de a poco. Paramedic ejercicios simples con sillas es un buen modo de Corporate investment banker; en especial, si no ha estado activo antes o durante un largo perodo. Establezca objetivos para los cuales trabaje. Consulte al mdico qu cantidad de ejercicio es mejor para usted. En general, la mayora de los adultos deben: Hacer ejercicios de intensidad moderada durante al menos 150 minutos por semana (30 minutos en la DIRECTV de la semana) o ejercicios vigorosos durante al menos 75 minutos por semana o una combinacin de [place]. El ejercicio de intensidad moderada puede incluir caminar a un ritmo rpido, Lobbyist, Education administrator yoga, hacer gimnasia acutica o hacer jardinera. El ejercicio vigoroso implica realizar actividades que requieren ms esfuerzo, como trotar o correr, Microbiologist, nadar o saltar la soga. Realizar ejercicios de fuerza al menos 2 das por [address] Esto puede incluir levantar pesas, hacer ejercicios de Runner, broadcasting/film/video y ejercicios con bandas de resistencia. Cmo ser ms activo fsicamente Disee un plan  Intente encontrar actividades que disfrute. Es ms probable que se comprometa con una rutina de ejercicios si no la siente como una obligacin. Si tiene problemas seos o articulares, elija ejercicios de bajo impacto, como caminar o nadar. Tenga en cuenta estos consejos para un buen resultado con su plan de ejercicios: Busque una pareja de entrenamiento para ayudar con el cumplimiento. nase a un grupo o clase, como una clase de [place], [place] clase de bicicleta fija o un equipo deportivo. Practique actividad en familia. Salga a caminar, andar en bicicleta o nadar. Incluya una variedad de ejercicios cada semana. Considere usar un monitor de [place], como una aplicacin de telfono mvil o un dispositivo como un reloj, que contar la cantidad de pasos que Teacher, adult education. Muchas personas buscan llegar a 10 000 pasos por da. Encuentre  maneras de estar activo en sus rutinas diarias Adems de  sus planes formales de ejercicio, puede encontrar maneras de hacer actividad fsica durante sus hbitos diarios, por ejemplo: Camine o ande en bicicleta al [place] o a la tienda. Utilice las Microbiologist del ascensor. Estacione lejos de la puerta del [place] o de la tienda. Planifique reuniones para caminar. Camine mientras est hablando por telfono. Dnde obtener ms informacin Centers for Disease Control and Prevention (Centros para Air traffic controller y la Prevencin de Event organiser): CampusCasting.com.pt [employer] Us on [employer], Sports & Nutrition (Consejo Presidencial sobre [place], Deportes y Nutricin): www.fitness.gov ChooseMyPlate: http://www.harvey.com/ Comunquese con un mdico si: Tiene dolor de cabeza, dolores musculares o dolor articular que le preocupan. Se siente mareado o siente que va a desvanecerse cuando realiza actividad fsica. Se desmaya. Siente que el corazn se saltea latidos, se acelera o los latidos son rpidos como un aleteo. Siente dolor en el pecho mientras realiza actividad fsica. Resumen La actividad fsica es buena para la mente y el cuerpo a cualquier edad, incluso si recin comienza a IT trainer. Si tiene una enfermedad crnica o no ha estado activo durante un [place], consulte con un mdico antes de aumentar la cantidad de [place] fsica. Elija actividades que sean seguras y placenteras para usted. Pregntele al mdico qu actividades son seguras para usted. Comience lentamente. Informe al mdico si tiene problemas cuando comienza a aumentar la cantidad de actividad fsica que Biomedical engineer. Esta informacin no tiene Theme park manager el consejo del mdico. Asegrese de hacerle al mdico cualquier pregunta que tenga. Document Revised: 05/23/2020 Document Reviewed: 05/23/2020 Elsevier Patient Education  [address]     Signed, Roe Rutherford [place], [place]  07/14/2022 1:08 PM     [facility]

## 2022-07-14 ENCOUNTER — Ambulatory Visit: Payer: Self-pay | Attending: Physician Assistant | Admitting: Physician Assistant

## 2022-07-14 ENCOUNTER — Encounter: Payer: Self-pay | Admitting: Physician Assistant

## 2022-07-14 VITALS — BP 118/70 | HR 71 | Ht 62.0 in | Wt 232.2 lb

## 2022-07-14 DIAGNOSIS — R55 Syncope and collapse: Secondary | ICD-10-CM

## 2022-07-14 DIAGNOSIS — F17200 Nicotine dependence, unspecified, uncomplicated: Secondary | ICD-10-CM

## 2022-07-14 DIAGNOSIS — IMO0001 Reserved for inherently not codable concepts without codable children: Secondary | ICD-10-CM

## 2022-07-14 DIAGNOSIS — I5032 Chronic diastolic (congestive) heart failure: Secondary | ICD-10-CM

## 2022-07-14 DIAGNOSIS — I2510 Atherosclerotic heart disease of native coronary artery without angina pectoris: Secondary | ICD-10-CM

## 2022-07-14 DIAGNOSIS — R7303 Prediabetes: Secondary | ICD-10-CM

## 2022-07-14 IMAGING — MG DIGITAL DIAGNOSTIC BILAT W/ TOMO W/ CAD
8 series · 8 of 24 positions shown · non-contrast
Comparison: None available.

CLINICAL DATA: Patient presents for diffuse intermittent left
breast pain.

EXAM:
DIGITAL DIAGNOSTIC BILATERAL MAMMOGRAM WITH TOMOSYNTHESIS AND CAD
TECHNIQUE: Bilateral digital diagnostic mammography and breast tomosynthesis
was performed. The images were evaluated with computer-aided
detection.

[R CC synth-2D]
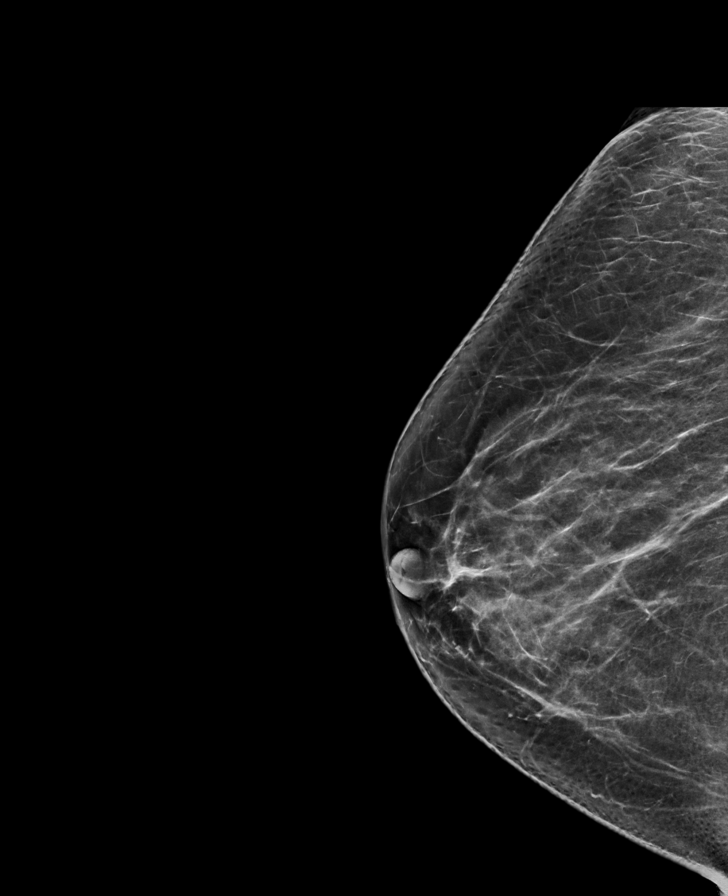

[R MLO synth-2D]
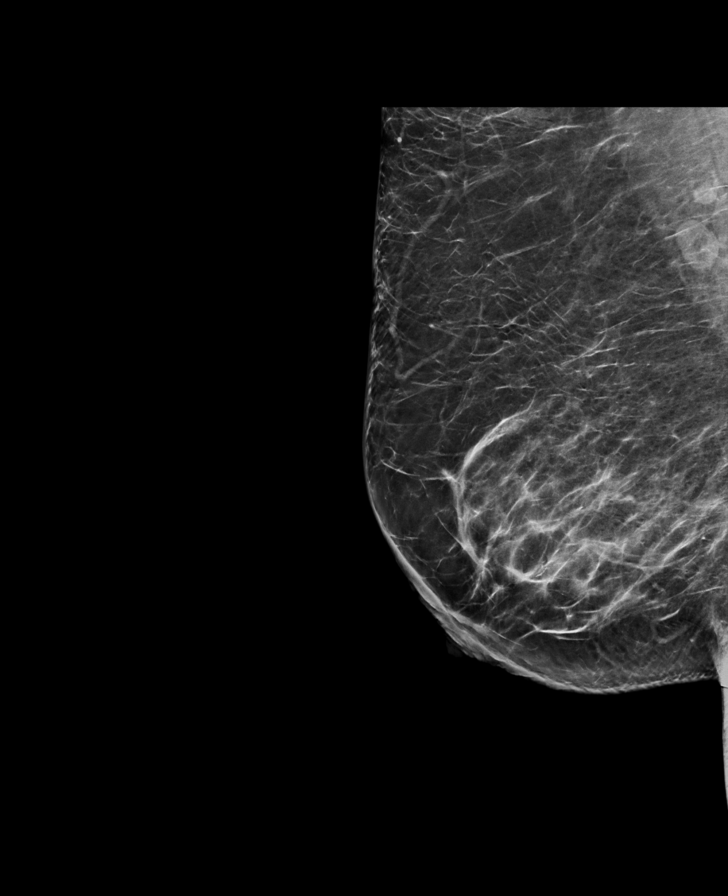

[L MLO synth-2D]
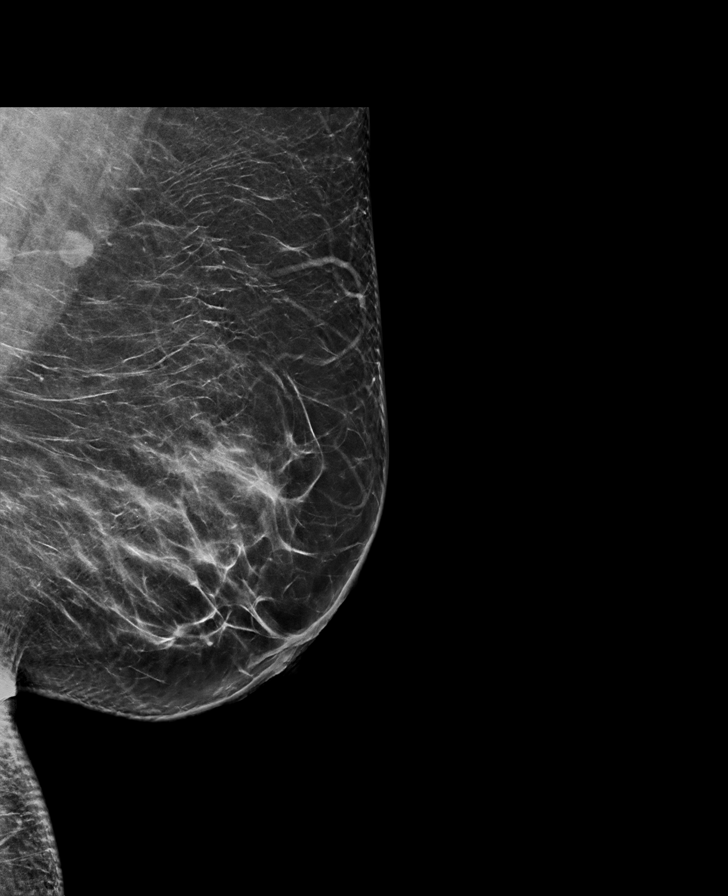

[L CC synth-2D]
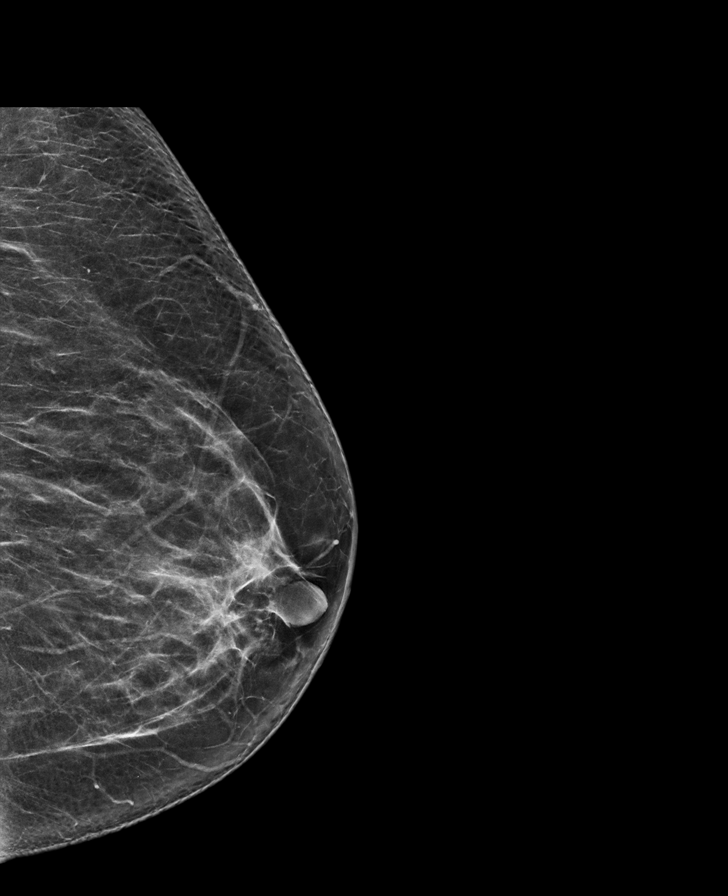

[R CC tomo · tomo slice 38/75.0]
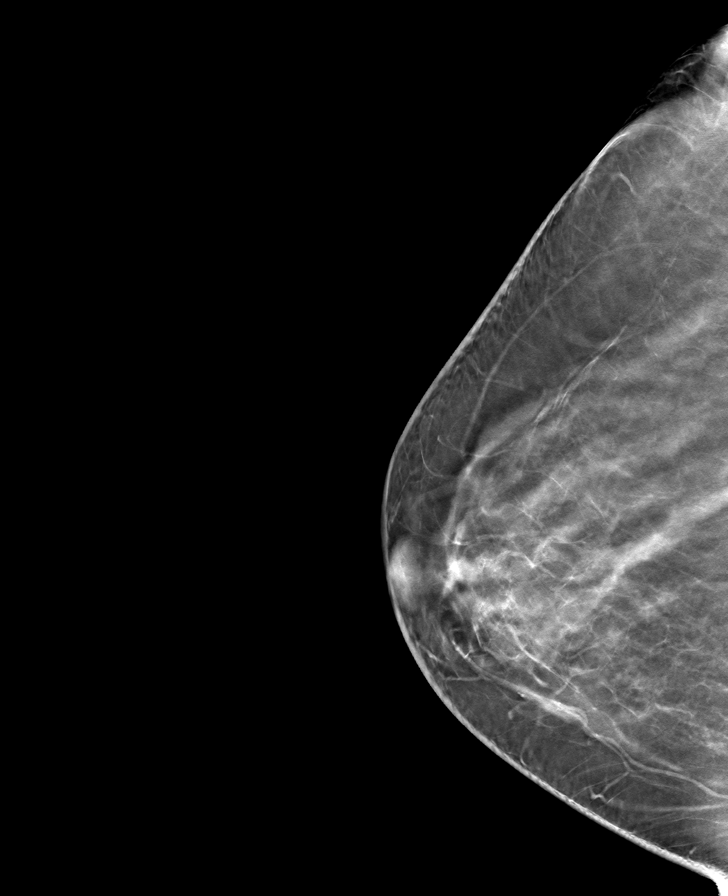

[L CC tomo · tomo slice 38/75.0]
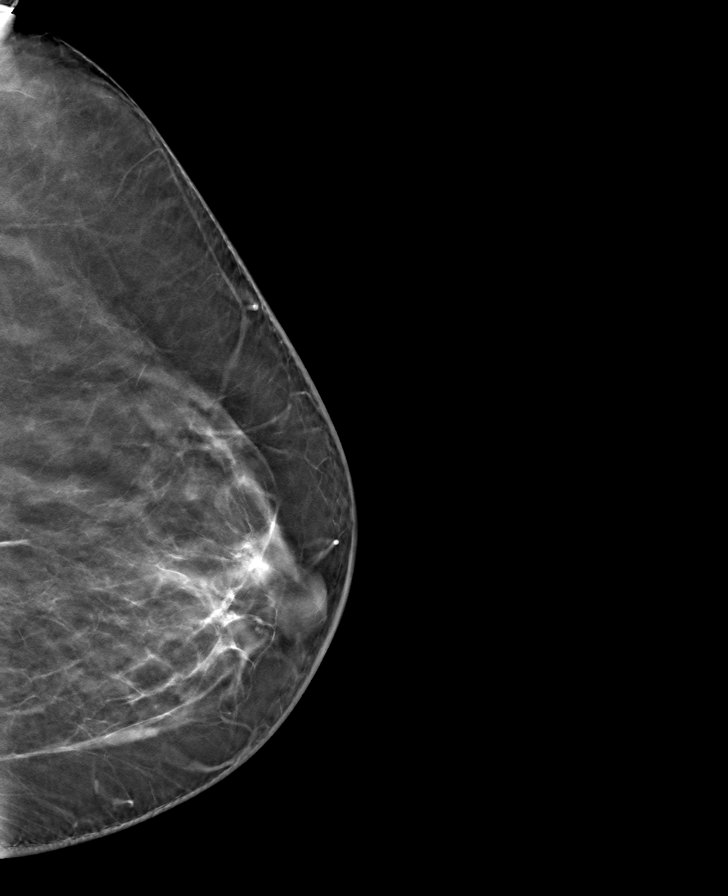

[L MLO tomo · tomo slice 42/83.0]
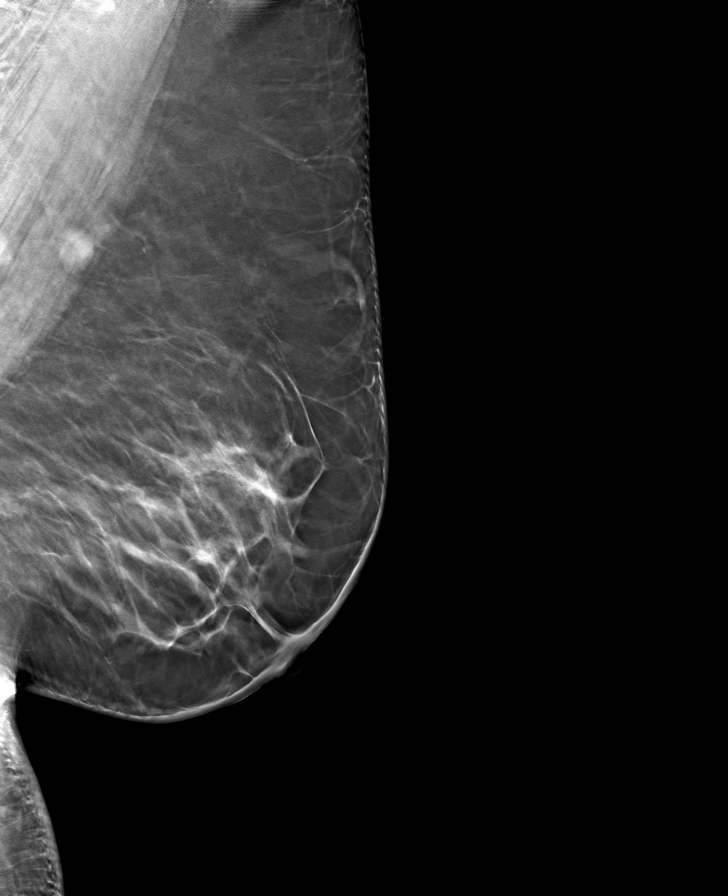

[R MLO tomo · tomo slice 40/79.0]
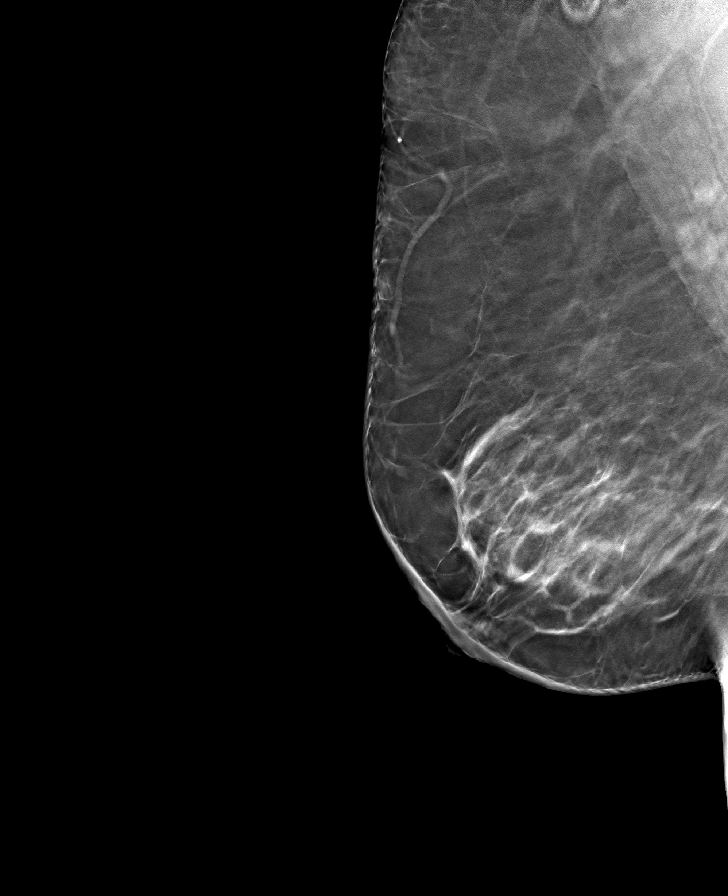

[8 of 24 positions shown; findings below may reference images not displayed]

ACR Breast Density Category c: The breast tissue is heterogeneously
dense, which may obscure small masses.
FINDINGS: No concerning masses, calcifications or distortion identified within
either breast.
IMPRESSION: No mammographic evidence for malignancy.

RECOMMENDATION:
Screening mammogram in one year.(Code:[id number])

I have discussed the findings and recommendations with the patient.
If applicable, a reminder letter will be sent to the patient
regarding the next appointment.

BI-RADS CATEGORY  1: Negative.

## 2022-07-14 NOTE — Patient Instructions (Signed)
Medication Instructions:   Your physician recommends that you continue on your current medications as directed. Please refer to the Current Medication list given to you today.  *If you need a refill on your cardiac medications before your next appointment, please call your pharmacy*  Lab Work: NONE ordered at this time of appointment   If you have labs (blood work) drawn today and your tests are completely normal, you will receive your results only by: MyChart Message (if you have MyChart) OR A paper copy in the mail If you have any lab test that is abnormal or we need to change your treatment, we will call you to review the results.  Testing/Procedures: NONE ordered at this time of appointment   Follow-Up: At [facility], you and your health needs are our priority.  As part of our continuing mission to provide you with exceptional heart care, we have created designated Provider Care Teams.  These Care Teams include your primary Cardiologist (physician) and Advanced Practice Providers (APPs -  Physician Assistants and Nurse Practitioners) who all work together to provide you with the care you need, when you need it.  Your next appointment:   1 year(s)  Provider:   Nicki Guadalajara, MD  or Micah Flesher, PA-C        Other Instructions Walk for 10 minutes after meals 3 times a day You may start back driving in August         Cmo aumentar la cantidad de actividad fsica que realiza  How to Increase Your Level of Physical Activity Realizar actividad fsica de forma regular es importante para la salud general y Counsellor. La Harley-Davidson de las personas no realizan suficiente actividad fsica. Hay muchas formas sencillas de aumentar la cantidad de actividad fsica que realiza, incluso si no ha estado muy activo en el pasado o si recin est comenzando. Cules son los beneficios de la actividad fsica? La actividad fsica tiene muchos beneficios tanto a corto como a Air cabin crew.  Practicar actividad fsica de [place] regular puede mejorar la salud mental y fsica, as como proporcionar otros beneficios. Beneficios para la salud fsica Ayudar a Geophysical data processor o a Technical brewer peso saludable. Fortalecer los [employer]elon [employer] y los huesos. Disminuir el riesgo de padecer ciertas enfermedades a largo plazo (crnicas), como enfermedades cardacas, cncer y diabetes. Ser capaz de moverse con mayor facilidad y por perodos ms prolongados sin cansarse (mayor resistencia o estamina). Mejorar la capacidad para combatir enfermedades (aumento de la inmunidad). Dormir mejor. Ayudarlo a mantenerse sano a medida que envejece, lo que incluye: Ayudarlo a Photographer movilidad y la capacidad de caminar y Programmer, systems. Prevenir accidentes, como las cadas. Aumentar la expectativa de vida. Beneficios para la salud mental Mejorar el estado de nimo y [place]. Disminuir la posibilidad de experimentar problemas de salud mental, como ansiedad o depresin. Ayudar a que se sienta bien con su cuerpo. Otros beneficios Encontrar nuevas fuentes de diversin y entretenimiento. Conocer a Economist que comparten un inters comn. Antes de comenzar Si tiene una enfermedad crnica o no ha [place] durante un [place], consulte con un mdico antes de Games developer. Pregntele al mdico qu actividades son seguras para usted. Comience de a poco. Paramedic ejercicios simples con sillas es un buen modo de Corporate investment banker; en especial, si no ha estado activo antes o durante un largo perodo. Establezca objetivos para los cuales trabaje. Consulte al mdico qu cantidad de ejercicio es mejor para usted. En general, la mayora de  los adultos deben: Hacer ejercicios de intensidad moderada durante al menos 150 minutos por semana (30 minutos en la DIRECTV de la semana) o ejercicios vigorosos durante al menos 75 minutos por semana o una combinacin de [place]. El ejercicio de intensidad moderada puede  incluir caminar a un ritmo rpido, Lobbyist, Education administrator yoga, hacer gimnasia acutica o hacer jardinera. El ejercicio vigoroso implica realizar actividades que requieren ms esfuerzo, como trotar o correr, Microbiologist, nadar o saltar la soga. Realizar ejercicios de fuerza al menos 2 das por [address] Esto puede incluir levantar pesas, hacer ejercicios de Runner, broadcasting/film/video y ejercicios con bandas de resistencia. Cmo ser ms activo fsicamente Disee un plan  Intente encontrar actividades que disfrute. Es ms probable que se comprometa con una rutina de ejercicios si no la siente como una obligacin. Si tiene problemas seos o articulares, elija ejercicios de bajo impacto, como caminar o nadar. Tenga en cuenta estos consejos para un buen resultado con su plan de ejercicios: Busque una pareja de entrenamiento para ayudar con el cumplimiento. nase a un grupo o clase, como una clase de [place], [place] clase de bicicleta fija o un equipo deportivo. Practique actividad en familia. Salga a caminar, andar en bicicleta o nadar. Incluya una variedad de ejercicios cada semana. Considere usar un monitor de [place], como una aplicacin de telfono mvil o un dispositivo como un reloj, que contar la cantidad de pasos que Teacher, adult education. Muchas personas buscan llegar a 10 000 pasos por da. Encuentre maneras de estar activo en sus rutinas diarias Adems de sus planes formales de ejercicio, puede encontrar maneras de hacer actividad fsica durante sus hbitos diarios, por ejemplo: Camine o ande en bicicleta al [place] o a la tienda. Utilice las Microbiologist del ascensor. Estacione lejos de la puerta del [place] o de la tienda. Planifique reuniones para caminar. Camine mientras est hablando por telfono. Dnde obtener ms informacin Centers for Disease Control and Prevention (Centros para Air traffic controller y la Prevencin de Event organiser): CampusCasting.com.pt Toys 'R' Us on  The [employer], Sports & Nutrition (Consejo Presidencial sobre [place], Deportes y Nutricin): www.fitness.gov ChooseMyPlate: http://www.harvey.com/ Comunquese con un mdico si: Tiene dolor de cabeza, dolores musculares o dolor articular que le preocupan. Se siente mareado o siente que va a desvanecerse cuando realiza actividad fsica. Se desmaya. Siente que el corazn se saltea latidos, se acelera o los latidos son rpidos como un aleteo. Siente dolor en el pecho mientras realiza actividad fsica. Resumen La actividad fsica es buena para la mente y el cuerpo a cualquier edad, incluso si recin comienza a IT trainer. Si tiene una enfermedad crnica o no ha estado activo durante un [place], consulte con un mdico antes de aumentar la cantidad de [place] fsica. Elija actividades que sean seguras y placenteras para usted. Pregntele al mdico qu actividades son seguras para usted. Comience lentamente. Informe al mdico si tiene problemas cuando comienza a aumentar la cantidad de actividad fsica que Biomedical engineer. Esta informacin no tiene Theme park manager el consejo del mdico. Asegrese de hacerle al mdico cualquier pregunta que tenga. Document Revised: 05/23/2020 Document Reviewed: 05/23/2020 Elsevier Patient Education  2024 [employer].

## 2022-07-15 ENCOUNTER — Telehealth: Payer: Self-pay | Admitting: Licensed Clinical Social Worker

## 2022-07-15 NOTE — Progress Notes (Unsigned)
Heart and Vascular Care Navigation  07/15/2022  Charrissa Gettings January 29, 1975 [id number]  Reason for Referral: uninsured, assistance applications have lapsed Patient is participating in a Managed Medicaid Plan: No, self pay only  Engaged with patient by telephone for initial visit for Heart and Vascular Care Coordination.                                                                                                   Assessment:                                     LCSW spoke with pt via telephone today at [phone] with assistance of Spanish language interpreter [place], [phone].  HRT/VAS Care Coordination     Patients Home Cardiology Office [facility]   Outpatient Care Team Social Worker   Social Worker Name: Octavio Graves, [place], [phone]   Living arrangements for the past 2 months Single Family Home   Lives with: Adult Children; Minor Children   Patient Current Insurance Coverage Self-Pay   Patient Has Concern With Paying Medical Bills Yes   Patient Concerns With Medical Bills no insurance, not medicaid eligible   Medical Bill Referrals: CAFA, Erskine Emery Card   Does Patient Have Prescription Coverage? No   Home Assistive Devices/Equipment None       Social History:                                                                             SDOH Screenings   Food Insecurity: No Food Insecurity (07/15/2022)  Housing: Low Risk  (07/15/2022)  Transportation Needs: No Transportation Needs (07/15/2022)  Utilities: Not At Risk (07/15/2022)  Depression (PHQ2-9): Medium Risk (11/14/2021)  Financial Resource Strain: Medium Risk (07/15/2022)  Tobacco Use: Medium Risk (07/14/2022)    SDOH Interventions: Financial Resources:  Financial Strain Interventions: Other (Comment) (mailed information about CAFA/OC, pt has filled these out multiple times before. pt family supporting housing costs and transportation, she recieves [employer]) Editor, commissioning for [employer]  Program  Food Insecurity:  Food Insecurity Interventions: Intervention Not Indicated (recieves [employer])  Housing Insecurity:  Housing Interventions: Intervention Not Indicated  Transportation:   Transportation Interventions: Patient Resources Dietitian)    Other Care Navigation Interventions:     Provided Pharmacy assistance resources  Pt currently not taking any medications, all previously managed by PCP, encouraged her to call them if any need refills or assistance   Follow-up plan:   LCSW has mailed pt a new Haematologist and [employer]. Included my card and instructions in Spanish on scheduling appt with financial counselor JT when completed as pt is active with [employer] [facility]. I will f/u to ensure applications received and answer any additional questions/concerns  at that time.

## 2022-07-24 ENCOUNTER — Telehealth: Payer: Self-pay | Admitting: Licensed Clinical Social Worker

## 2022-07-24 NOTE — Telephone Encounter (Signed)
H&V Care Navigation CSW Progress Note  Clinical Social Worker contacted patient by phone to f/u on assistance applications. Was able to reach her at [phone], with assistance of Spanish language interpreter [place] [phone]. She confirms that she has received applications, working on them. She is currently out at the store so unsure if she has financial counselors number. Encouraged her to let me know if she cannot find it when she gets home. Will need to submit through JT as is a community clinic established pt. LCSW will f/u to check in again.   Patient is participating in a Managed Medicaid Plan:  No, self pay only, working on CAFA/OC  SDOH Screenings   Food Insecurity: No Food Insecurity (07/15/2022)  Housing: Low Risk  (07/15/2022)  Transportation Needs: No Transportation Needs (07/15/2022)  Utilities: Not At Risk (07/15/2022)  Depression (PHQ2-9): Medium Risk (11/14/2021)  Financial Resource Strain: Medium Risk (07/15/2022)  Tobacco Use: Medium Risk (07/14/2022)   Octavio Graves, MSW, LCSW Clinical Social Worker II [facility] Heart/Vascular Care Navigation  [phone]- work cell phone (preferred) [phone]- desk phone

## 2022-08-04 ENCOUNTER — Telehealth: Payer: Self-pay | Admitting: Licensed Clinical Social Worker

## 2022-08-04 NOTE — Telephone Encounter (Signed)
H&V Care Navigation CSW Progress Note  Clinical Social Worker contacted patient by phone to f/u on assistance applications. Was unable to reach pt this morning at [phone], with assistance of Spanish language interpreter [place], [phone], I left voicemail requesting call back. Will re-attempt as able.  Patient is participating in a Managed Medicaid Plan:  No, self pay only  SDOH Screenings   Food Insecurity: No Food Insecurity (07/15/2022)  Housing: Low Risk  (07/15/2022)  Transportation Needs: No Transportation Needs (07/15/2022)  Utilities: Not At Risk (07/15/2022)  Depression (PHQ2-9): Medium Risk (11/14/2021)  Financial Resource Strain: Medium Risk (07/15/2022)  Tobacco Use: Medium Risk (07/14/2022)    Octavio Graves, MSW, LCSW Clinical Social Worker II [facility] Heart/Vascular Care Navigation  [phone]- work cell phone (preferred) [phone]- desk phone

## 2022-08-05 ENCOUNTER — Telehealth: Payer: Self-pay | Admitting: Licensed Clinical Social Worker

## 2022-08-05 NOTE — Telephone Encounter (Signed)
H&V Care Navigation CSW Progress Note  Clinical Social Worker contacted patient by phone to f/u on assistance applications. Was unable to reach pt this morning at [phone], with assistance of Spanish language interpreter Sue Lush, [phone], I left voicemail requesting call back. Will re-attempt one more time as able.   Patient is participating in a Managed Medicaid Plan:  No, self pay only  SDOH Screenings   Food Insecurity: No Food Insecurity (07/15/2022)  Housing: Low Risk  (07/15/2022)  Transportation Needs: No Transportation Needs (07/15/2022)  Utilities: Not At Risk (07/15/2022)  Depression (PHQ2-9): Medium Risk (11/14/2021)  Financial Resource Strain: Medium Risk (07/15/2022)  Tobacco Use: Medium Risk (07/14/2022)   Octavio Graves, MSW, LCSW Clinical Social Worker II [facility] Heart/Vascular Care Navigation  [phone]- work cell phone (preferred) [phone]- desk phone

## 2022-08-13 ENCOUNTER — Ambulatory Visit: Payer: Self-pay | Attending: Physician Assistant | Admitting: Physician Assistant

## 2022-08-13 ENCOUNTER — Telehealth (HOSPITAL_BASED_OUTPATIENT_CLINIC_OR_DEPARTMENT_OTHER): Payer: Self-pay | Admitting: Licensed Clinical Social Worker

## 2022-08-13 ENCOUNTER — Other Ambulatory Visit: Payer: Self-pay

## 2022-08-13 ENCOUNTER — Encounter: Payer: Self-pay | Admitting: Physician Assistant

## 2022-08-13 VITALS — BP 130/83 | HR 66 | Wt 231.4 lb

## 2022-08-13 DIAGNOSIS — R232 Flushing: Secondary | ICD-10-CM

## 2022-08-13 DIAGNOSIS — N920 Excessive and frequent menstruation with regular cycle: Secondary | ICD-10-CM

## 2022-08-13 DIAGNOSIS — R635 Abnormal weight gain: Secondary | ICD-10-CM

## 2022-08-13 DIAGNOSIS — M546 Pain in thoracic spine: Secondary | ICD-10-CM

## 2022-08-13 MED ORDER — IBUPROFEN 600 MG PO TABS
600.0000 mg | ORAL_TABLET | Freq: Four times a day (QID) | ORAL | 0 refills | Status: DC | PRN
  Filled 2022-08-13: qty 60, 15d supply, fill #0

## 2022-08-13 MED ORDER — CYCLOBENZAPRINE HCL 5 MG PO TABS
5.0000 mg | ORAL_TABLET | Freq: Three times a day (TID) | ORAL | 0 refills | Status: DC | PRN
  Filled 2022-08-13: qty 60, 20d supply, fill #0

## 2022-08-13 NOTE — Telephone Encounter (Signed)
H&V Care Navigation CSW Progress Note  Clinical Social Worker noted pt application for [employer] has been mailed in and completed. Approval letter mailed to pt.  PATIENT APPROVED FOR 100% FINANCIAL ASSISTANCE PER CAFA APPLICATION AND SUPPORTING DOCUMENTATION. CAFA APP SCANNED TO ACCOUNT [id number] APPROVAL DATES OF [employer] ---- 07/25/22 - 01/24/23 APPROVAL LETTER ON ACCOUNT [id number]  .  Patient is participating in a Managed Medicaid Plan:  No, self pay, 100% CAFA  SDOH Screenings   Food Insecurity: Patient Declined (08/12/2022)  Housing: Low Risk  (08/12/2022)  Transportation Needs: Unmet Transportation Needs (08/12/2022)  Utilities: Not At Risk (07/15/2022)  Alcohol Screen: Low Risk  (08/12/2022)  Depression (PHQ2-9): Medium Risk (11/14/2021)  Financial Resource Strain: High Risk (08/12/2022)  Physical Activity: Unknown (08/12/2022)  Social Connections: Socially Isolated (08/12/2022)  Stress: No Stress Concern Present (08/12/2022)  Tobacco Use: Medium Risk (08/13/2022)    Octavio Graves, MSW, LCSW Clinical Social Worker II [facility] Heart/Vascular Care Navigation  [phone]- work cell phone (preferred) [phone]- desk phone

## 2022-08-13 NOTE — Progress Notes (Signed)
Patient ID: Katherine [name], female   DOB: [birth year], 47 y.o.   MRN: [id number]   Katherine [name], is a 47 y.o. female  VQQ:[id number]  [id number]  DOB - 09-26-1975  Chief Complaint  Patient presents with   Back Pain   Menorrhagia    Patient states period is coming twice a month        Subjective:   Katherine [name] is a 47 y.o. female here today for continued thoracic back pain.  She has not taking OTC meds but prescription ibu and flexeril have been helpful.  No CP/SOB.  Pain is middle thoracic and muscles just on either side of spine.    Periods are occurring twice a month and very heavy.  Occurring for about 3 months. Some hot flashes.    Also c/o neck feels tight in the mornings.  She is unsure if it is muscle spasms or lymph nodes.  No fevers.  No difficulty swallowing.    She is also having trouble losing weight.  Cardiologist told her she needs to lose weight and she has been trying but has not had success  No problems updated.  ALLERGIES: No Known Allergies  PAST MEDICAL HISTORY: Past Medical History:  Diagnosis Date   Abdominal pain    Anemia    Depression    postpartum depression   Morbid obesity (HCC)    Splenomegaly     MEDICATIONS AT HOME: Prior to Admission medications   Medication Sig Start Date End Date Taking? Authorizing Provider  cyclobenzaprine (FLEXERIL) 5 MG tablet Take 1 tablet (5 mg total) by mouth 3 (three) times daily as needed for muscle spasms. 08/13/22   Anders Simmonds, PA-C  fluticasone (FLONASE) 50 MCG/ACT nasal spray PLACE 2 SPRAYS INTO BOTH NOSTRILS DAILY AS NEEDED Patient not taking: Reported on 03/23/2022 11/09/19 11/28/20  Cain Saupe, MD  ibuprofen (ADVIL) 600 MG tablet Take 1 tablet (600 mg total) by mouth every 6 (six) hours as needed (Take with food) as needed for pain 08/13/22   Georgian Co M, PA-C    ROS: Neg HEENT Neg resp Neg cardiac Neg GI Neg GU Neg MS Neg psych Neg neuro  Objective:    Vitals:   08/13/22 1104  BP: 130/83  Pulse: 66  SpO2: 95%  Weight: 231 lb 6.4 oz (105 kg)   Exam General appearance : Awake, alert, not in any distress. Speech Clear. Not toxic looking HEENT: Atraumatic and Normocephalic Neck: Supple, no JVD. No cervical lymphadenopathy.  Chest: Good air entry bilaterally, CTAB.  No rales/rhonchi/wheezing CVS: S1 S2 regular, no murmurs.  No spiny tenderness.  There is some spasm in the thoracic region. BUE DTR=intact B.   Extremities: B/L Lower Ext shows no edema, both legs are warm to touch Neurology: Awake alert, and oriented X 3, CN II-XII intact, Non focal Skin: No Rash  Data Review Lab Results  Component Value Date   HGBA1C 5.7 (H) 03/23/2022   HGBA1C 5.4 04/25/2020   HGBA1C 5.4 11/09/2019    Assessment & Plan   1. Hot flashes Gyn referral - TSH - CBC with Differential/Platelet  2. Menorrhagia with regular cycle -gyn referral - CBC with Differential/Platelet - FSH/LH  3. Bilateral thoracic back pain, unspecified chronicity No red flags - cyclobenzaprine (FLEXERIL) 5 MG tablet; Take 1 tablet (5 mg total) by mouth 3 (three) times daily as needed for muscle spasms.  Dispense: 60 tablet; Refill: 0 - ibuprofen (ADVIL) 600 MG tablet; Take 1 tablet (600 mg total) by mouth  every 6 (six) hours as needed (Take with food) as needed for pain  Dispense: 60 tablet; Refill: 0  4. Weight gain work at a goal of eliminating sugary drinks, candy, desserts, sweets, refined sugars, processed foods, and white carbohydrates.  - TSH  Clarissa Alarcon CMA interpreted   Return in about 6 months (around 02/13/2023) for PCP/Johnson for chronic conditions.  The patient was given clear instructions to go to ER or return to medical center if symptoms don't improve, worsen or new problems develop. The patient verbalized understanding. The patient was told to call to get lab results if they haven't heard anything in the next week.      Georgian Co,  PA-C [facility] [place], [place] [phone]   08/13/2022, 12:43 PM

## 2022-08-14 ENCOUNTER — Other Ambulatory Visit: Payer: Self-pay

## 2022-08-14 ENCOUNTER — Other Ambulatory Visit: Payer: Self-pay | Admitting: Physician Assistant

## 2022-08-14 LAB — CBC WITH DIFFERENTIAL/PLATELET
Basophils Absolute: 0.1 10*3/uL (ref 0.0–0.2)
Basos: 1 %
EOS (ABSOLUTE): 0.3 10*3/uL (ref 0.0–0.4)
Eos: 3 %
Hematocrit: 34 % (ref 34.0–46.6)
Hemoglobin: 10.9 g/dL — ABNORMAL LOW (ref 11.1–15.9)
Immature Grans (Abs): 0.1 10*3/uL (ref 0.0–0.1)
Immature Granulocytes: 1 %
Lymphocytes Absolute: 2.4 10*3/uL (ref 0.7–3.1)
Lymphs: 25 %
MCH: 26.2 pg — ABNORMAL LOW (ref 26.6–33.0)
MCHC: 32.1 g/dL (ref 31.5–35.7)
MCV: 82 fL (ref 79–97)
Monocytes Absolute: 0.5 10*3/uL (ref 0.1–0.9)
Monocytes: 6 %
Neutrophils Absolute: 6.2 10*3/uL (ref 1.4–7.0)
Neutrophils: 64 %
Platelets: 336 10*3/uL (ref 150–450)
RBC: 4.16 x10E6/uL (ref 3.77–5.28)
RDW: 12.6 % (ref 11.7–15.4)
WBC: 9.6 10*3/uL (ref 3.4–10.8)

## 2022-08-14 LAB — FSH/LH
FSH: 3.4 m[IU]/mL
LH: 9.8 m[IU]/mL

## 2022-08-14 LAB — TSH: TSH: 1.83 u[IU]/mL (ref 0.450–4.500)

## 2022-08-14 MED ORDER — IRON (FERROUS SULFATE) 325 (65 FE) MG PO TABS
325.0000 mg | ORAL_TABLET | Freq: Every day | ORAL | 1 refills | Status: AC
  Filled 2022-08-14 (×2): qty 90, 90d supply, fill #0
  Filled 2023-06-18: qty 90, 90d supply, fill #1

## 2022-08-17 ENCOUNTER — Telehealth: Payer: Self-pay

## 2022-08-17 NOTE — Telephone Encounter (Signed)
Pt was called and is aware of results, DOB was confirmed.  Interpreter id #[id number]

## 2022-08-17 NOTE — Telephone Encounter (Signed)
-----   Message from Georgian Co sent at 08/14/2022 10:15 AM EDT ----- Your labs show anemia.  I sent you a prescription of irone tablets to take once daily and referred you to a gynecologist because of the heavy periods.  Your labs do not yet look menopausal.  Thyroid is normal.  Thanks, Georgian Co, PA-C

## 2022-08-22 ENCOUNTER — Ambulatory Visit (HOSPITAL_BASED_OUTPATIENT_CLINIC_OR_DEPARTMENT_OTHER)
Admission: RE | Admit: 2022-08-22 | Discharge: 2022-08-22 | Disposition: A | Discharge status: Home / Self Care | Payer: Self-pay | Source: Ambulatory Visit | Attending: Physician Assistant | Admitting: Physician Assistant

## 2022-08-22 ENCOUNTER — Other Ambulatory Visit (HOSPITAL_BASED_OUTPATIENT_CLINIC_OR_DEPARTMENT_OTHER): Payer: Self-pay

## 2022-08-22 DIAGNOSIS — Z1231 Encounter for screening mammogram for malignant neoplasm of breast: Secondary | ICD-10-CM

## 2022-09-24 ENCOUNTER — Encounter: Payer: Self-pay | Admitting: Obstetrics and Gynecology

## 2022-09-29 ENCOUNTER — Encounter: Payer: Self-pay | Admitting: Obstetrics and Gynecology

## 2022-09-29 ENCOUNTER — Ambulatory Visit (INDEPENDENT_AMBULATORY_CARE_PROVIDER_SITE_OTHER): Payer: Self-pay | Admitting: Obstetrics and Gynecology

## 2022-09-29 ENCOUNTER — Other Ambulatory Visit (HOSPITAL_COMMUNITY)
Admission: RE | Admit: 2022-09-29 | Discharge: 2022-09-29 | Disposition: A | Discharge status: Home / Self Care | Payer: Self-pay | Source: Ambulatory Visit | Attending: Family Medicine | Admitting: Family Medicine

## 2022-09-29 VITALS — BP 149/100 | HR 61 | Ht 63.0 in | Wt 226.6 lb

## 2022-09-29 DIAGNOSIS — N924 Excessive bleeding in the premenopausal period: Secondary | ICD-10-CM

## 2022-09-29 DIAGNOSIS — N898 Other specified noninflammatory disorders of vagina: Secondary | ICD-10-CM

## 2022-09-29 DIAGNOSIS — Z01411 Encounter for gynecological examination (general) (routine) with abnormal findings: Secondary | ICD-10-CM

## 2022-09-29 DIAGNOSIS — I5030 Unspecified diastolic (congestive) heart failure: Secondary | ICD-10-CM | POA: Insufficient documentation

## 2022-09-29 NOTE — Progress Notes (Signed)
Ms Katherine [name] presents for evaluation of her [facility] Pt reports having 2 cycles every month now, for the last 4 months Prior to this monthly cycles Cycles last 3-4 days with first monthly cycle and 5-6 days with second monthly cycle Heavy with cramps and clots Needs pap smear UTD on mammogram  Also reports a vaginal discharge for the last few days.   H/O chronic HFpEF, followed by cardiology  TSVD x 6, largest 5-6 pounds H/O BTL  Denies any SOB, CP bowel or bladder dysfunction  PE AF VSS Chaperone present   Lungs clear Heart RRR Abd soft + BS GU Nl EGBUS, vaginal swab collected, uterus small, mobile, non tender, no masses. Bimanual exam limited by pt habitus  A/P Vaginal discharge        Menorrhagia with irregular  F/U with vaginal swab results Referred to [facility] for pap smear Recommend GYN U/S for further evaluation of pt's cycles. Suspect hormonal related. Information to discuss self pay with radiology provided to pt. GYN U/S ordered placed. Pt to call and let us know when OK to schedule. F/U per GYN U/S results.   Live interrupter used during today's visit

## 2022-10-01 LAB — CERVICOVAGINAL ANCILLARY ONLY
Bacterial Vaginitis (gardnerella): NEGATIVE
Candida Glabrata: NEGATIVE
Candida Vaginitis: NEGATIVE
Chlamydia: NEGATIVE
Comment: NEGATIVE
Comment: NEGATIVE
Comment: NEGATIVE
Comment: NEGATIVE
Comment: NEGATIVE
Comment: NORMAL
Neisseria Gonorrhea: NEGATIVE
Trichomonas: NEGATIVE

## 2022-10-06 ENCOUNTER — Emergency Department (HOSPITAL_BASED_OUTPATIENT_CLINIC_OR_DEPARTMENT_OTHER): Payer: No Typology Code available for payment source

## 2022-10-06 ENCOUNTER — Emergency Department (HOSPITAL_BASED_OUTPATIENT_CLINIC_OR_DEPARTMENT_OTHER)
Admission: EM | Admit: 2022-10-06 | Discharge: 2022-10-06 | Disposition: A | Discharge status: Home / Self Care | Payer: No Typology Code available for payment source | Attending: Emergency Medicine | Admitting: Emergency Medicine

## 2022-10-06 ENCOUNTER — Other Ambulatory Visit (HOSPITAL_BASED_OUTPATIENT_CLINIC_OR_DEPARTMENT_OTHER): Payer: Self-pay

## 2022-10-06 ENCOUNTER — Encounter (HOSPITAL_BASED_OUTPATIENT_CLINIC_OR_DEPARTMENT_OTHER): Payer: Self-pay | Admitting: Emergency Medicine

## 2022-10-06 ENCOUNTER — Other Ambulatory Visit: Payer: Self-pay

## 2022-10-06 DIAGNOSIS — D649 Anemia, unspecified: Secondary | ICD-10-CM | POA: Insufficient documentation

## 2022-10-06 DIAGNOSIS — N12 Tubulo-interstitial nephritis, not specified as acute or chronic: Secondary | ICD-10-CM | POA: Insufficient documentation

## 2022-10-06 DIAGNOSIS — D72829 Elevated white blood cell count, unspecified: Secondary | ICD-10-CM | POA: Insufficient documentation

## 2022-10-06 LAB — COMPREHENSIVE METABOLIC PANEL
ALT: 18 U/L (ref 0–44)
AST: 16 U/L (ref 15–41)
Albumin: 4.2 g/dL (ref 3.5–5.0)
Alkaline Phosphatase: 80 U/L (ref 38–126)
Anion gap: 9 (ref 5–15)
BUN: 10 mg/dL (ref 6–20)
CO2: 25 mmol/L (ref 22–32)
Calcium: 8.9 mg/dL (ref 8.9–10.3)
Chloride: 102 mmol/L (ref 98–111)
Creatinine, Ser: 0.65 mg/dL (ref 0.44–1.00)
GFR, Estimated: >60 mL/min (ref >60)
Glucose, Bld: 112 mg/dL — ABNORMAL HIGH (ref 70–99)
Potassium: 3.6 mmol/L (ref 3.5–5.1)
Sodium: 136 mmol/L (ref 135–145)
Total Bilirubin: 0.5 mg/dL (ref 0.3–1.2)
Total Protein: 7.1 g/dL (ref 6.5–8.1)

## 2022-10-06 LAB — LIPASE, BLOOD: Lipase: <10 U/L — ABNORMAL LOW (ref 11–51)

## 2022-10-06 LAB — CBC
HCT: 33.2 % — ABNORMAL LOW (ref 36.0–46.0)
Hemoglobin: 11.2 g/dL — ABNORMAL LOW (ref 12.0–15.0)
MCH: 27.1 pg (ref 26.0–34.0)
MCHC: 33.7 g/dL (ref 30.0–36.0)
MCV: 80.2 fL (ref 80.0–100.0)
Platelets: 311 10*3/uL (ref 150–400)
RBC: 4.14 MIL/uL (ref 3.87–5.11)
RDW: 13.5 % (ref 11.5–15.5)
WBC: 16.5 10*3/uL — ABNORMAL HIGH (ref 4.0–10.5)
nRBC: 0 % (ref 0.0–0.2)

## 2022-10-06 LAB — URINALYSIS, ROUTINE W REFLEX MICROSCOPIC
Bilirubin Urine: NEGATIVE
Glucose, UA: NEGATIVE mg/dL
Ketones, ur: NEGATIVE mg/dL
Nitrite: POSITIVE — AB
Protein, ur: 30 mg/dL — AB
Specific Gravity, Urine: 1.01 (ref 1.005–1.030)
WBC, UA: >50 WBC/hpf (ref 0–5)
pH: 6 (ref 5.0–8.0)

## 2022-10-06 LAB — PREGNANCY, URINE: Preg Test, Ur: NEGATIVE

## 2022-10-06 MED ORDER — KETOROLAC TROMETHAMINE 30 MG/ML IJ SOLN
30.0000 mg | Freq: Once | INTRAMUSCULAR | Status: AC
  Administered 2022-10-06: 30 mg via INTRAVENOUS
  Filled 2022-10-06: qty 1

## 2022-10-06 MED ORDER — SULFAMETHOXAZOLE-TRIMETHOPRIM 800-160 MG PO TABS
1.0000 | ORAL_TABLET | Freq: Two times a day (BID) | ORAL | 0 refills | Status: AC
  Filled 2022-10-06: qty 28, 14d supply, fill #0

## 2022-10-06 MED ORDER — ACETAMINOPHEN 500 MG PO TABS
1000.0000 mg | ORAL_TABLET | Freq: Once | ORAL | Status: AC
  Administered 2022-10-06: 1000 mg via ORAL
  Filled 2022-10-06: qty 2

## 2022-10-06 MED ORDER — SODIUM CHLORIDE 0.9 % IV SOLN
1.0000 g | Freq: Once | INTRAVENOUS | Status: AC
  Administered 2022-10-06: 1 g via INTRAVENOUS
  Filled 2022-10-06: qty 10

## 2022-10-06 MED ORDER — ONDANSETRON 4 MG PO TBDP
4.0000 mg | ORAL_TABLET | Freq: Three times a day (TID) | ORAL | 0 refills | Status: DC | PRN
  Filled 2022-10-06: qty 20, 7d supply, fill #0

## 2022-10-06 MED ORDER — MORPHINE SULFATE (PF) 4 MG/ML IV SOLN
4.0000 mg | Freq: Once | INTRAVENOUS | Status: AC
  Administered 2022-10-06: 4 mg via INTRAVENOUS
  Filled 2022-10-06: qty 1

## 2022-10-06 MED ORDER — IBUPROFEN 600 MG PO TABS
600.0000 mg | ORAL_TABLET | Freq: Four times a day (QID) | ORAL | 0 refills | Status: DC | PRN
  Filled 2022-10-06: qty 30, 8d supply, fill #0

## 2022-10-06 MED ORDER — LACTATED RINGERS IV BOLUS
1000.0000 mL | Freq: Once | INTRAVENOUS | Status: AC
  Administered 2022-10-06: 1000 mL via INTRAVENOUS

## 2022-10-06 NOTE — ED Triage Notes (Signed)
Pt arrives to ED with c/o sudden onset of suprapubic abdominal pain that started this morning. Pt notes urinary frequency.

## 2022-10-06 NOTE — ED Provider Notes (Signed)
[place] EMERGENCY DEPARTMENT AT [facility] Provider Note   CSN: [id number] Arrival date & time: 10/06/22  1003     History  Chief Complaint  Patient presents with   Abdominal Pain    Katherine [name] is a 47 y.o. female.   Abdominal Pain  Patient is a 47 year old female with a past medical history significant for depression, splenomegaly, obesity, anemia, abdominal pain, history of tubal ligation but no other abdominal surgeries  She describes lower abdominal/suprapubic pain that began last night overnight woke her up from her sleep she states she is been urinating frequently since then has some discomfort with urination and some pressure just above her pubis.  She denies any fevers, she states it does feel somewhat similar to prior urinary tract infection she has had.  She denies any vaginal discharge.  Denies any nausea or vomiting or diarrhea or constipation.      Home Medications Prior to Admission medications   Medication Sig Start Date End Date Taking? Authorizing Provider  ibuprofen (ADVIL) 600 MG tablet Take 1 tablet (600 mg total) by mouth every 6 (six) hours as needed. 10/06/22  Yes Kristy Catoe S, PA  ondansetron (ZOFRAN-ODT) 4 MG disintegrating tablet Take 1 tablet (4 mg total) by mouth every 8 (eight) hours as needed for nausea or vomiting. 10/06/22  Yes Nellie Chevalier S, PA  sulfamethoxazole-trimethoprim (BACTRIM DS) 800-160 MG tablet Take 1 tablet by mouth 2 (two) times daily for 14 days. 10/06/22 10/20/22 Yes Jamillah Camilo S, PA  cyclobenzaprine (FLEXERIL) 5 MG tablet Take 1 tablet (5 mg total) by mouth 3 (three) times daily as needed for muscle spasms. 08/13/22   Anders Simmonds, PA-C  Iron, Ferrous Sulfate, 325 (65 Fe) MG TABS Take 1 tablet by mouth daily. 08/14/22   Anders Simmonds, PA-C      Allergies    Patient has no known allergies.    Review of Systems   Review of Systems  Gastrointestinal:  Positive for abdominal pain.     Physical Exam Updated Vital Signs BP (!) 157/93   Pulse 71   Temp 99.3 F (37.4 C) (Oral)   Resp 18   LMP 09/15/2022 (Exact Date)   SpO2 97%  Physical Exam Vitals and nursing note reviewed.  Constitutional:      General: She is in acute distress.     Appearance: She is not ill-appearing, toxic-appearing or diaphoretic.     Comments: Patient appears uncomfortable.  HENT:     Head: Normocephalic and atraumatic.     Nose: Nose normal.     Mouth/Throat:     Mouth: Mucous membranes are dry.  Eyes:     General: No scleral icterus. Cardiovascular:     Rate and Rhythm: Normal rate and regular rhythm.     Pulses: Normal pulses.     Heart sounds: Normal heart sounds.  Pulmonary:     Effort: Pulmonary effort is normal. No respiratory distress.     Breath sounds: No wheezing.  Abdominal:     Palpations: Abdomen is soft.     Tenderness: There is no abdominal tenderness.     Comments: Abdomen is obese, nontender.  No guarding or rebound. On reexamination perhaps some mild tenderness suprapubically with deep palpation.  Musculoskeletal:     Cervical back: Normal range of motion.     Right lower leg: No edema.     Left lower leg: No edema.  Skin:    General: Skin is warm and dry.  Capillary Refill: Capillary refill takes less than 2 seconds.  Neurological:     Mental Status: She is alert. Mental status is at baseline.  Psychiatric:        Mood and Affect: Mood normal.        Behavior: Behavior normal.     ED Results / Procedures / Treatments   Labs (all labs ordered are listed, but only abnormal results are displayed) Labs Reviewed  LIPASE, BLOOD - Abnormal; Notable for the following components:      Result Value   Lipase <10 (*)    All other components within normal limits  COMPREHENSIVE METABOLIC PANEL - Abnormal; Notable for the following components:   Glucose, Bld 112 (*)    All other components within normal limits  CBC - Abnormal; Notable for the following  components:   WBC 16.5 (*)    Hemoglobin 11.2 (*)    HCT 33.2 (*)    All other components within normal limits  URINALYSIS, ROUTINE W REFLEX MICROSCOPIC - Abnormal; Notable for the following components:   APPearance HAZY (*)    Hgb urine dipstick MODERATE (*)    Protein, ur 30 (*)    Nitrite POSITIVE (*)    Leukocytes,Ua LARGE (*)    Bacteria, UA FEW (*)    Crystals PRESENT (*)    All other components within normal limits  PREGNANCY, URINE    EKG None  Radiology US PELVIC COMPLETE W TRANSVAGINAL AND TORSION R/O  Result Date: 10/06/2022 CLINICAL DATA:  Suprapubic pain EXAM: TRANSABDOMINAL AND TRANSVAGINAL ULTRASOUND OF PELVIS DOPPLER ULTRASOUND OF OVARIES TECHNIQUE: Both transabdominal and transvaginal ultrasound examinations of the pelvis were performed. Transabdominal technique was performed for global imaging of the pelvis including uterus, ovaries, adnexal regions, and pelvic cul-de-sac. It was necessary to proceed with endovaginal exam following the transabdominal exam to visualize the adnexal structures. Color and duplex Doppler ultrasound was utilized to evaluate blood flow to the ovaries. COMPARISON:  CT abdomen pelvis 10/06/2022 FINDINGS: Uterus Measurements: 8.5 x 4.2 x 5.7 cm = volume: 104.8 mL. No fibroids or other mass visualized. Endometrium Thickness: 6.6 mm.  No focal abnormality visualized. Right ovary Measurements: 2.6 x 1.7 x 2.2 cm = volume: 4.9 mL. Normal appearance/no adnexal mass. Left ovary Measurements: 3.5 x 2.9 x 3.2 cm = volume: 17.3 mL. There is a 2.8 cm simple cyst in the left ovary. Pulsed Doppler evaluation of both ovaries demonstrates normal low-resistance arterial and venous waveforms. Other findings No abnormal free fluid. IMPRESSION: 1. Left ovarian follicle measuring 2.8 cm, normal finding for premenopausal patient (benign simple cyst for postmenopausal patient). No follow-up imaging is recommended. Reference: Radiology 2019 Nov;293(2):359-371 2. No evidence  for ovarian torsion. Electronically Signed   By: Annia Belt M.D.   On: 10/06/2022 15:56   CT Renal Stone Study  Result Date: 10/06/2022 CLINICAL DATA:  Acute onset suprapubic abdominal pain EXAM: CT ABDOMEN AND PELVIS WITHOUT CONTRAST TECHNIQUE: Multidetector CT imaging of the abdomen and pelvis was performed following the standard protocol without IV contrast. RADIATION DOSE REDUCTION: This exam was performed according to the departmental dose-optimization program which includes automated exposure control, adjustment of the mA and/or kV according to patient size and/or use of iterative reconstruction technique. COMPARISON:  None Available. FINDINGS: Lower chest: Mild linear opacity along bases likely scar or atelectasis. No pleural effusion. Hepatobiliary: No focal liver abnormality is seen. No gallstones, gallbladder wall thickening, or biliary dilatation. Pancreas: Unremarkable. No pancreatic ductal dilatation or surrounding inflammatory changes. Spleen: Normal  in size without focal abnormality. Adrenals/Urinary Tract: The adrenal glands are preserved. No renal or ureteral stones are identified. Slight periureteral stranding on the left of uncertain etiology and significance. Preserved contours of the urinary bladder. Stomach/Bowel: On this non oral contrast exam, the large bowel has a normal course and caliber. Few left-sided colonic diverticula. Moderate stool. Normal appendix. Stomach and small bowel is nondilated. Vascular/Lymphatic: No significant vascular findings are present. No enlarged abdominal or pelvic lymph nodes. Reproductive: Uterus is present. Small cystic structure in the left adnexa measuring 2.7 cm. Provided history reports a history of having both ovaries removed. Would recommend further evaluation with a pelvic ultrasound when clinically appropriate. Other: Midline anterior abdominal wall fat containing hernias including umbilical. No free intra-air or free fluid. Musculoskeletal: Mild  degenerative changes of the spine and pelvis. IMPRESSION: Scattered colonic stool and diverticula. No bowel obstruction, free air or free fluid. Normal appendix. No obstructing renal or ureteral stones. There is slight left periureteral stranding of uncertain etiology and significance. Please correlate for infectious or inflammatory process or any evidence of prior stone. Postcontrast study could be considered as clinically appropriate. Cystic structure in the left adnexa measuring 2.7 cm. There is provided history of prior oophorectomy. Recommend additional workup with pelvic ultrasound when clinically appropriate Electronically Signed   By: Karen Kays M.D.   On: 10/06/2022 14:04    Procedures Procedures    Medications Ordered in ED Medications  morphine (PF) 4 MG/ML injection 4 mg (4 mg Intravenous Given 10/06/22 1117)  lactated ringers bolus 1,000 mL (0 mLs Intravenous Stopped 10/06/22 1253)  cefTRIAXone (ROCEPHIN) 1 g in sodium chloride 0.9 % 100 mL IVPB (0 g Intravenous Stopped 10/06/22 1429)  ketorolac (TORADOL) 30 MG/ML injection 30 mg (30 mg Intravenous Given 10/06/22 1351)  morphine (PF) 4 MG/ML injection 4 mg (4 mg Intravenous Given 10/06/22 1352)  acetaminophen (TYLENOL) tablet 1,000 mg (1,000 mg Oral Given 10/06/22 1533)    ED Course/ Medical Decision Making/ A&P                                 Medical Decision Making Amount and/or Complexity of Data Reviewed Labs: ordered. Radiology: ordered.  Risk OTC drugs. Prescription drug management.   This patient presents to the ED for concern of abd pain, this involves a number of treatment options, and is a complaint that carries with it a high risk of complications and morbidity. A differential diagnosis was considered for the patient's symptoms which is discussed below:   The causes of generalized abdominal pain include but are not limited to AAA, mesenteric ischemia, appendicitis, diverticulitis, DKA, gastritis, gastroenteritis,  AMI, nephrolithiasis, pancreatitis, peritonitis, adrenal insufficiency,lead poisoning, iron toxicity, intestinal ischemia, constipation, UTI,SBO/LBO, splenic rupture, biliary disease, IBD, IBS, PUD, or hepatitis. Ectopic pregnancy, ovarian torsion, PID.    Co morbidities: Discussed in HPI   Brief History:  Patient is a 47 year old female with a past medical history significant for depression, splenomegaly, obesity, anemia, abdominal pain, history of tubal ligation but no other abdominal surgeries  She describes lower abdominal/suprapubic pain that began last night overnight woke her up from her sleep she states she is been urinating frequently since then has some discomfort with urination and some pressure just above her pubis.  She denies any fevers, she states it does feel somewhat similar to prior urinary tract infection she has had.  She denies any vaginal discharge.  Denies any nausea or  vomiting or diarrhea or constipation.    EMR reviewed including pt PMHx, past surgical history and past visits to ER.   See HPI for more details   Lab Tests:  Leukocytosis of 16.5, mild anemia 11.2, CMP unremarkable, lipase undetectable, pregnancy urinalysis negative.  Urinalysis shows crystals, few bacteria greater than 50 WBCs positive for leukocytes and nitrites.  This is consistent with hemorrhagic cystitis.  She has some left flank discomfort which makes me concerned that this represents pyelonephritis.  Will treat as such.   Imaging Studies:  Abnormal findings. I personally reviewed all imaging studies. Imaging notable for Left ureteral inflammation seen on CT.  Left ovary with corpus luteum cyst. No ureteral stones  Cardiac Monitoring:  The patient was maintained on a cardiac monitor.  I personally viewed and interpreted the cardiac monitored which showed an underlying rhythm of: NSR    Medicines ordered:  I ordered medication including Tylenol, Rocephin, morphine, Toradol,  lactated Ringer's, morphine for pain Reevaluation of the patient after these medicines showed that the patient improved I have reviewed the patients home medicines and have made adjustments as needed   Critical Interventions:     Consults/Attending Physician   I discussed this case with my attending physician who cosigned this note including patient's presenting symptoms, physical exam, and planned diagnostics and interventions. Attending physician stated agreement with plan or made changes to plan which were implemented.   Reevaluation:  After the interventions noted above I re-evaluated patient and found that they have :improved   Social Determinants of Health:      Problem List / ED Course:  Pyelonephritis given patient's left flank pain and UTI symptoms and leukocytosis.  She feels significantly improved is tolerating p.o. ambulatory and does not have any evidence of kidney stone as nidus for her infection.  Will treat with Bactrim for extended period for pyelonephritis, Zofran for nausea ibuprofen provided.  She will follow-up with primary care.  Return precautions emergency room provided.  No evidence of torsion on ultrasound. This presentation is consistent with hemorrhagic cystitis.  She has some left flank discomfort which makes me concerned that this represents pyelonephritis.  Will treat as such.   Dispostion:  After consideration of the diagnostic results and the patients response to treatment, I feel that the patent would benefit from outpatient follow-up.   Final Clinical Impression(s) / ED Diagnoses Final diagnoses:  Pyelonephritis    Rx / DC Orders ED Discharge Orders          Ordered    sulfamethoxazole-trimethoprim (BACTRIM DS) 800-160 MG tablet  2 times daily        10/06/22 1655    ondansetron (ZOFRAN-ODT) 4 MG disintegrating tablet  Every 8 hours PRN        10/06/22 1655    ibuprofen (ADVIL) 600 MG tablet  Every 6 hours PRN        10/06/22 1655               Solon Augusta Redmond, [place] 10/06/22 1736    Gwyneth Sprout, MD 10/07/22 1336

## 2022-10-06 NOTE — Discharge Instructions (Signed)
Follow-up with your OB/GYN, you have a cyst on your ovary.  Follow-up with your primary care provider for reevaluation and to make sure that you are improving from the urinary tract infection which is causing her significant symptoms.  I am treating you for a condition called pyelonephritis.  Take the antibiotics for the full course even if your symptoms improved.  Make sure you are drinking plenty of water, return to the emergency room for worsening symptoms or any passing out.  Zofran as needed for any nausea.  Please use Tylenol or ibuprofen for pain.  You may use 600 mg ibuprofen every 6 hours or 1000 mg of Tylenol every 6 hours.  You may choose to alternate between the 2.  This would be most effective.  Not to exceed 4 g of Tylenol within 24 hours.  Not to exceed 3200 mg ibuprofen 24 hours.       En el seguimiento con su obstetra/gineclogo, tiene un quiste en el ovario.  Haga un seguimiento con su proveedor de atencin primaria para una reevaluacin y para asegurarse de que est mejorando de la infeccin del tracto urinario que le est causando sntomas significativos.  Estoy tratando a usted por una afeccin llamada pielonefritis.  [employer] antibiticos durante todo el tratamiento, incluso si sus sntomas mejoraron.  Asegrese de beber [employer], regrese a la sala de emergencias si los sntomas empeoran o si se desmaya.  Zofran segn sea necesario para cualquier nusea.  Use Tylenol o ibuprofeno para el dolor.  Puede usar 600 mg de ibuprofeno cada 6 horas o 1000 mg de Tylenol cada 6 horas.  Puede optar por [employer] 2.  Esto sera lo ms efectivo.  No exceder los 4 g de Tylenol en 24 horas.  No exceder los 3200 mg de ibuprofeno durante 24 horas.

## 2022-10-07 ENCOUNTER — Emergency Department (HOSPITAL_COMMUNITY)
Admission: RE | Admit: 2022-10-07 | Discharge: 2022-10-07 | Disposition: A | Discharge status: Home / Self Care | Payer: No Typology Code available for payment source | Source: Ambulatory Visit | Attending: Obstetrics and Gynecology | Admitting: Obstetrics and Gynecology

## 2022-10-07 ENCOUNTER — Encounter (HOSPITAL_COMMUNITY): Payer: Self-pay

## 2022-10-07 DIAGNOSIS — N924 Excessive bleeding in the premenopausal period: Secondary | ICD-10-CM

## 2022-11-04 ENCOUNTER — Encounter: Payer: Self-pay | Admitting: Medical

## 2022-11-04 ENCOUNTER — Ambulatory Visit: Payer: No Typology Code available for payment source | Admitting: Medical

## 2022-11-04 VITALS — BP 136/68 | HR 56 | Wt 225.0 lb

## 2022-11-04 DIAGNOSIS — N83201 Unspecified ovarian cyst, right side: Secondary | ICD-10-CM

## 2022-11-04 NOTE — Progress Notes (Signed)
  History:  Ms. Katherine [name] is a 47 y.o. [phone] who presents to clinic today for follow-up after [place]. Patient had [place] scheduled due to increased frequency of periods, however presented to the ED prior to scheduled [place] for abdominal pain and had a CT scan. CT scan revealed mass near ovary. [place] was performed in the ED and a 2.8 cm simple ovarian cyst was noted. The patient was also treated for possible pyelonephritis vs UTI and since completing antibiotics symptoms have resolved. She denies fever. She has still noted periods lasting 4-6 days about 21-24 days apart.   The following portions of the patient's history were reviewed and updated as appropriate: allergies, current medications, family history, past medical history, social history, past surgical history and problem list.  Review of Systems:  Review of Systems  Constitutional:  Negative for fever and malaise/fatigue.  Gastrointestinal:  Negative for abdominal pain, constipation, diarrhea, nausea and vomiting.  Genitourinary:  Negative for dysuria, frequency and urgency.       Neg - vaginal bleeding, discharge, pelvic pain      Objective:  Physical Exam BP 136/68   Pulse (!) 56   Wt 225 lb (102.1 kg)   LMP 10/18/2022 (Exact Date)   BMI 39.86 kg/m  Physical Exam Constitutional:      General: She is not in acute distress.    Appearance: Normal appearance. She is obese.  Cardiovascular:     Rate and Rhythm: Normal rate.  Pulmonary:     Effort: Pulmonary effort is normal.  Abdominal:     General: Abdomen is flat. There is no distension.     Palpations: Abdomen is soft.  Skin:    General: Skin is warm and dry.     Findings: No erythema.  Neurological:     Mental Status: She is alert and oriented to person, place, and time.  Psychiatric:        Mood and Affect: Mood normal.     Health Maintenance Due  Topic Date Due   COVID-19 Vaccine (1) Never done   Hepatitis C Screening  Never done   Colonoscopy  Never done    INFLUENZA VACCINE  08/27/2022   DTaP/Tdap/Td (2 - Td or Tdap) 11/02/2022    Labs, imaging and previous visits in Epic reviewed  Assessment & Plan:  1. Cyst of right ovary - Simple cyst explained likely to resolve without intervention, warning signs for worsening condition and ovarian torsion reviewed with interpreter present.  - Periods remain more frequent but regular, no further testing recommended at this time. Patient advised to let us know if this changes.  - Referred to [facility] for pap smear   Approximately 20 minutes of total time was spent with this patient on chart review, history taking, patient education, coordination of care and documentation with interpreter present  Return if symptoms worsen or fail to improve.  Marny Lowenstein, PA-C 11/04/2022 10:05 AM

## 2022-11-04 NOTE — Progress Notes (Signed)
CAP interpreter Earle Gell.

## 2022-11-11 ENCOUNTER — Telehealth: Payer: Self-pay

## 2022-11-11 NOTE — Telephone Encounter (Signed)
Copied from CRM [id number]. Topic: Appointment Scheduling - Scheduling Inquiry for Clinic >> Nov 04, 2022 11:54 AM [place] B wrote: Reason for CRM: pt says needs pap smear, pt has already seen OBGYN and they want her to fu right away with a papsmear  from when she had bleeding 3 months ago, she is not bleeding now, but needs fu to be sure everything is ok.

## 2022-11-11 NOTE — Telephone Encounter (Signed)
Based on last Pap smear result that was done in 2021.  She will be due again in 2026.

## 2022-11-11 NOTE — Telephone Encounter (Signed)
Called & spoke to the patient to try to obtain further information. Patient stated that her OBGYN instructed patient to come to [facility] to set up an appointment for a pap smear. Patient clarified that it was not the ER that she is unsure why she was directed back but would like to be seen.  Please advise.

## 2022-11-13 NOTE — Telephone Encounter (Signed)
Called but no answer. LVM to call back. Inform patient that last pap was done in 2021 & HPV is negative. Currently not due until 2026. Per Dr.Johnson we can schedule a pap if patient prefers.

## 2022-11-16 NOTE — Telephone Encounter (Signed)
Called & spoke to the patient. Verified name & DOB. Informed that last pap smear done was in 2021 and based on findings she will not be due until 2026. Patient expressed verbal understanding & stated that she agrees to wait until that time. No further questions or concerns at this time.

## 2022-11-24 ENCOUNTER — Ambulatory Visit: Payer: No Typology Code available for payment source

## 2023-01-05 ENCOUNTER — Ambulatory Visit (INDEPENDENT_AMBULATORY_CARE_PROVIDER_SITE_OTHER): Payer: No Typology Code available for payment source

## 2023-01-05 ENCOUNTER — Other Ambulatory Visit (HOSPITAL_COMMUNITY)
Admission: RE | Admit: 2023-01-05 | Discharge: 2023-01-05 | Disposition: A | Discharge status: Home / Self Care | Payer: No Typology Code available for payment source | Source: Ambulatory Visit

## 2023-01-05 VITALS — BP 137/70 | HR 64 | Wt 225.0 lb

## 2023-01-05 DIAGNOSIS — Z124 Encounter for screening for malignant neoplasm of cervix: Secondary | ICD-10-CM | POA: Insufficient documentation

## 2023-01-05 DIAGNOSIS — Z603 Acculturation difficulty: Secondary | ICD-10-CM

## 2023-01-05 DIAGNOSIS — Z758 Other problems related to medical facilities and other health care: Secondary | ICD-10-CM

## 2023-01-05 DIAGNOSIS — N841 Polyp of cervix uteri: Secondary | ICD-10-CM | POA: Insufficient documentation

## 2023-01-05 DIAGNOSIS — Z1151 Encounter for screening for human papillomavirus (HPV): Secondary | ICD-10-CM

## 2023-01-05 DIAGNOSIS — Z01419 Encounter for gynecological examination (general) (routine) without abnormal findings: Secondary | ICD-10-CM

## 2023-01-05 DIAGNOSIS — Z1239 Encounter for other screening for malignant neoplasm of breast: Secondary | ICD-10-CM

## 2023-01-05 NOTE — Progress Notes (Signed)
CAP interpreter Angelique.

## 2023-01-05 NOTE — Progress Notes (Signed)
GYNECOLOGY OFFICE VISIT NOTE-WELL WOMAN EXAM  History:   Katherine [name] is a 47 year old here today for annual exam. She reports her LMP was 12/25/2022.  She reports 4-5 day cycle. She reports heavy flow and cramping, but denies clots. She reports drinking tea with relief of cramping.   Birth Control:  None  Reproductive Concerns: Sexually Active: Yes-No pain or discomfort now that cyst has resolved.  Partners Type: Men Number of partners in last year: Two STD Testing: Limited  Obstetrical History: [id number] -SVD Gynecological History: Simple cyst-resolved. No h/o abnormal paps.  Vaginal/GU Concerns: No concerns.  Breast Concerns/Exams: Mammogram Family history of breast, uterine, cervical, or ovarian cancer  Medical and Nutrition PCP: Unsure of name; Last appt last year. Reports it is on [employer].  Significant PMx and Surgical Hx: Obesity, Umbilical Hernia Repair-4 years ago Exercise: None Tobacco/Drugs/Alcohol/Vaping: Nutrition: Reports she is eating healthier.   Social Safety at home: Lives with kids and endorses safety.  DV/A: Denies Social Support: Endorses Employment: None  Past Medical History:  Diagnosis Date   Abdominal pain    Anemia    Depression    postpartum depression   Morbid obesity (HCC)    Splenomegaly     Past Surgical History:  Procedure Laterality Date   nexplanon     NO PAST SURGERIES     ROBOTIC ASSISTED LAPAROSCOPIC VENTRAL/INCISIONAL HERNIA REPAIR N/A 01/04/2018   Procedure: ROBOTIC ASSISTED LAPAROSCOPIC VENTRAL/INCISIONAL HERNIA REPAIR;  Surgeon: Leafy Ro, MD;  Location: [facility] ORS;  Service: General;  Laterality: N/A;   ROBOTIC ASSISTED SALPINGO OOPHERECTOMY Bilateral 01/04/2018   Procedure: ROBOTIC ASSISTED SALPINGECTOMY;  Surgeon: Vena Austria, MD;  Location: [facility] ORS;  Service: Gynecology;  Laterality: Bilateral;   TUBAL LIGATION     UPPER GI ENDOSCOPY  2015    The following portions of the patient's history were  reviewed and updated as appropriate: allergies, current medications, past family history, past medical history, past social history, past surgical history and problem list.   Health Maintenance: Pap: 07/10/2019 Date, Negative Results.  Mammogram: August 25, 2022-[facility]-Negative.  Colonoscopy: None-Referral Placed Review of Systems:  Pertinent items noted in HPI and remainder of comprehensive ROS otherwise negative.    Objective:    Physical Exam BP 137/70   Pulse 64   Wt 225 lb (102.1 kg)   LMP 12/25/2022 (Exact Date)   BMI 39.86 kg/m  Physical Exam Vitals reviewed. Exam conducted with a chaperone present [place], [place]).  Constitutional:      General: She is not in acute distress.    Appearance: Normal appearance. She is obese.  HENT:     Head: Normocephalic and atraumatic.  Eyes:     Conjunctiva/sclera: Conjunctivae normal.  Cardiovascular:     Rate and Rhythm: Normal rate and regular rhythm.  Pulmonary:     Effort: Pulmonary effort is normal. No respiratory distress.     Breath sounds: Normal breath sounds.  Chest:  Breasts:    Right: No mass, nipple discharge, skin change or tenderness.     Left: No mass, nipple discharge, skin change or tenderness.     Comments: CBE completed and normal Abdominal:     General: Bowel sounds are normal.     Palpations: Abdomen is soft.     Tenderness: There is no abdominal tenderness.  Genitourinary:    General: Normal vulva.     Comments: NEFG Vault: Pink with scant amt clear mucoid discharge. Cervix with polyp noted at 12 o'clock. Pap  collected with broom and spatula. Bleeding from polyp noted and hemostatic with pressure via faux swab.   Musculoskeletal:        General: Normal range of motion.     Cervical back: Normal range of motion.  Skin:    General: Skin is warm and dry.  Neurological:     Mental Status: She is alert and oriented to person, place, and time.  Psychiatric:        Mood and Affect: Mood normal.         Behavior: Behavior normal.      Labs and Imaging No results found for this or any previous visit (from the past 168 hour(s)). No results found.   Assessment & Plan:  47 year old Female Well Woman Exam with Pap Breast Exam Desires STD Testing Language Barrier  1. Well woman exam with routine gynecological exam -Exam performed and findings discussed. -Encouraged to activate and utilize Mychart for reviewing of results, communication with office, and scheduling of appts. -Educated on AHA exercise recommendations of 30 minutes of moderate to vigorous activity at least 5x/week. - Cytology - PAP( [place])  2. Pap smear for cervical cancer screening -Educated on ASCCP guidelines regarding pap smear evaluation and frequency. -Informed of turnover time and provider/clinic policy on releasing results. -STD Testing added to pap - Cytology - PAP( [place])  3. Screening breast examination -CBE normal -Mammogram normal -Educated and encouraged to continue SBE with increased breast awareness including examination of breast for skin changes, moles, tenderness, etc.   4. Language barrier -Interpretations completed with assistance of in-person CAP interpreter: Angelique.  5. Cervical polyp -Informed that cervical polyp incidental finding that is usually benign in nature.   -Informed that removal not indicated as size is small.  -Cautioned that bleeding after sex may be d/t polyp.   Routine preventative health maintenance measures emphasized. Please refer to After Visit Summary for other counseling recommendations.   No follow-ups on file.      Cherre Robins, CNM 01/05/2023

## 2023-01-08 ENCOUNTER — Ambulatory Visit (INDEPENDENT_AMBULATORY_CARE_PROVIDER_SITE_OTHER): Payer: Self-pay | Admitting: Family Medicine

## 2023-01-08 ENCOUNTER — Other Ambulatory Visit: Payer: Self-pay

## 2023-01-08 ENCOUNTER — Encounter: Payer: Self-pay | Admitting: Family Medicine

## 2023-01-08 VITALS — BP 120/73 | HR 60 | Temp 98.1°F | Ht 63.0 in | Wt 225.0 lb

## 2023-01-08 DIAGNOSIS — M62838 Other muscle spasm: Secondary | ICD-10-CM

## 2023-01-08 DIAGNOSIS — E781 Pure hyperglyceridemia: Secondary | ICD-10-CM

## 2023-01-08 DIAGNOSIS — I5032 Chronic diastolic (congestive) heart failure: Secondary | ICD-10-CM

## 2023-01-08 DIAGNOSIS — M546 Pain in thoracic spine: Secondary | ICD-10-CM

## 2023-01-08 DIAGNOSIS — Z7689 Persons encountering health services in other specified circumstances: Secondary | ICD-10-CM

## 2023-01-08 DIAGNOSIS — Z23 Encounter for immunization: Secondary | ICD-10-CM

## 2023-01-08 DIAGNOSIS — Z1211 Encounter for screening for malignant neoplasm of colon: Secondary | ICD-10-CM

## 2023-01-08 MED ORDER — IBUPROFEN 600 MG PO TABS
600.0000 mg | ORAL_TABLET | Freq: Four times a day (QID) | ORAL | 0 refills | Status: AC | PRN
Start: 2023-01-08 — End: ?
  Filled 2023-01-08 – 2023-01-22 (×2): qty 30, 8d supply, fill #0

## 2023-01-08 MED ORDER — CYCLOBENZAPRINE HCL 5 MG PO TABS
5.0000 mg | ORAL_TABLET | Freq: Three times a day (TID) | ORAL | 1 refills | Status: AC | PRN
  Filled 2023-01-08 – 2023-01-22 (×2): qty 30, 10d supply, fill #0

## 2023-01-08 NOTE — Progress Notes (Signed)
I,Katherine [name], CMA,acting as a Neurosurgeon for [employer], NP.,have documented all relevant documentation on the behalf of Katherine Hose, NP,as directed by  Katherine Hose, NP while in the presence of Katherine Hose, NP.  Subjective:  Patient ID: Katherine [name] , female    DOB: 1975/02/18 , 47 y.o.   MRN: [id number]  Chief Complaint  Patient presents with   Establish Care   Back Pain   chest discomfort    HPI  Patient is a 47 year old female who presents today to establish primary care. Patient is here with her daughter today. She reports she last seen a PCP about 6 months. Patient states  today that she still has the pain/discomfort in her chest 3 days ago that she was previously seen in the hospital for. In February 2024 she had an EKG that showed no ischemic changes and cardiac enzymes were negative. Referred to cardiology at [facility] and she had an echocardiogram showed a preserved EF. CT coronary was obtained and showed CAD of less than 25 % stenosis and coronary calcium score of zero.Patient also wore a 14 day Ziopatch and the results were quoted as largely unrevealing, so patient's pain was classified as musculoskeletal in nature at that time and she states she was asked by Cardiology to return for evaluation in 1 year. Patient states she was prescribed Ibuprofen 600 mg Q hrs PRN and Cyclobenzaprine for muscle spasms. Patient states they were helping but she is currently out of it. Advised patient  that she will get a refill on her medications today, but if chest pain persists after taking her medications or becomes unbearable, crushing, she must call 911 or get someone to take her to the ER immediately. Patient and her daughter voiced understanding.  Patient speaks Spanish and we had an interpreter in the room, who help to interpret. Ms Sue Lush from [facility], [place], [place]     Past Medical History:  Diagnosis Date   Abdominal pain    Anemia     Depression    postpartum depression   Morbid obesity (HCC)    Splenomegaly      Family History  Problem Relation Age of Onset   Diabetes Mother    Kidney disease Brother      Current Outpatient Medications:    Iron, Ferrous Sulfate, 325 (65 Fe) MG TABS, Take 1 tablet by mouth daily., Disp: 90 tablet, Rfl: 1   cyclobenzaprine (FLEXERIL) 5 MG tablet, Take 1 tablet (5 mg total) by mouth 3 (three) times daily as needed for up to 20 days for muscle spasms., Disp: 30 tablet, Rfl: 1   ibuprofen (ADVIL) 600 MG tablet, Take 1 tablet (600 mg total) by mouth every 6 (six) hours as needed., Disp: 30 tablet, Rfl: 0   No Known Allergies   Review of Systems  Constitutional: Negative.   Eyes: Negative.   Respiratory: Negative.  Negative for chest tightness and shortness of breath.   Cardiovascular: Negative.   Gastrointestinal: Negative.   Genitourinary: Negative.   Musculoskeletal:  Positive for back pain.  Skin: Negative.   Psychiatric/Behavioral: Negative.       Today's Vitals   01/08/23 1109  BP: 120/73  Pulse: 60  Temp: 98.1 F (36.7 C)  TempSrc: Oral  Weight: 225 lb (102.1 kg)  Height: 5\' 3"  (1.6 m)  PainSc: 0-No pain   Body mass index is 39.86 kg/m.  Wt Readings from Last 3 Encounters:  01/08/23 225 lb (102.1 kg)  01/05/23 225 lb (102.1 kg)  11/04/22 225 lb (102.1 kg)    The 10-year ASCVD risk score (Arnett DK, et al., 2019) is: 1.1%   Values used to calculate the score:     Age: 60 years     Sex: Female     Is Non-Hispanic African American: No     Diabetic: No     Tobacco smoker: No     Systolic Blood Pressure: 120 mmHg     Is BP treated: No     HDL Cholesterol: 34 mg/dL     Total Cholesterol: 159 mg/dL  Objective:  Physical Exam HENT:     Head: Normocephalic.  Cardiovascular:     Rate and Rhythm: Normal rate.  Pulmonary:     Effort: Pulmonary effort is normal.     Breath sounds: Normal breath sounds.  Abdominal:     General: Bowel sounds are normal.   Musculoskeletal:        General: Tenderness present.  Skin:    General: Skin is warm and dry.  Neurological:     Mental Status: She is alert. Mental status is at baseline.         Assessment And Plan:  Bilateral thoracic back pain, unspecified chronicity Assessment & Plan: Take meds as prescribed with food  Orders: -     Ibuprofen; Take 1 tablet (600 mg total) by mouth every 6 (six) hours as needed.  Dispense: 30 tablet; Refill: 0  Establishing care with new doctor, encounter for  Chronic heart failure with preserved ejection fraction (HCC) Assessment & Plan: Followed by Cardiology  Orders: -     CMP14+EGFR -     CBC with Differential/Platelet  Hypertriglyceridemia Assessment & Plan: Low carb diet and exercise advised  Orders: -     Lipid panel  Muscle spasm -     Cyclobenzaprine HCl; Take 1 tablet (5 mg total) by mouth 3 (three) times daily as needed for up to 20 days for muscle spasms.  Dispense: 30 tablet; Refill: 1  Immunization due -     Flu vaccine trivalent PF, 6mos and older(Flulaval,Afluria,Fluarix,Fluzone)  Screening for colon cancer -     Ambulatory referral to Gastroenterology    Return in about 4 months (around 05/09/2023) for physical.  Patient was given opportunity to ask questions. Patient verbalized understanding of the plan and was able to repeat key elements of the plan. All questions were answered to their satisfaction.    I, Katherine Hose, NP, have reviewed all documentation for this visit. The documentation on 01/23/23 for the exam, diagnosis, procedures, and orders are all accurate and complete.   IF YOU HAVE BEEN REFERRED TO A SPECIALIST, IT MAY TAKE 1-2 WEEKS TO SCHEDULE/PROCESS THE REFERRAL. IF YOU HAVE NOT HEARD FROM US/SPECIALIST IN TWO WEEKS, PLEASE GIVE [place] A CALL AT [phone].

## 2023-01-09 LAB — CBC WITH DIFFERENTIAL/PLATELET
Basophils Absolute: 0.1 10*3/uL (ref 0.0–0.2)
Basos: 1 %
EOS (ABSOLUTE): 0.3 10*3/uL (ref 0.0–0.4)
Eos: 3 %
Hematocrit: 35.6 % (ref 34.0–46.6)
Hemoglobin: 11.6 g/dL (ref 11.1–15.9)
Immature Grans (Abs): 0.1 10*3/uL (ref 0.0–0.1)
Immature Granulocytes: 1 %
Lymphocytes Absolute: 3 10*3/uL (ref 0.7–3.1)
Lymphs: 30 %
MCH: 26.4 pg — ABNORMAL LOW (ref 26.6–33.0)
MCHC: 32.6 g/dL (ref 31.5–35.7)
MCV: 81 fL (ref 79–97)
Monocytes Absolute: 0.6 10*3/uL (ref 0.1–0.9)
Monocytes: 6 %
Neutrophils Absolute: 6.1 10*3/uL (ref 1.4–7.0)
Neutrophils: 59 %
Platelets: 319 10*3/uL (ref 150–450)
RBC: 4.39 x10E6/uL (ref 3.77–5.28)
RDW: 12.8 % (ref 11.7–15.4)
WBC: 10.2 10*3/uL (ref 3.4–10.8)

## 2023-01-09 LAB — LIPID PANEL
Chol/HDL Ratio: 4.7 {ratio} — ABNORMAL HIGH (ref 0.0–4.4)
Cholesterol, Total: 159 mg/dL (ref 100–199)
HDL: 34 mg/dL — ABNORMAL LOW (ref >39)
LDL Chol Calc (NIH): 93 mg/dL (ref 0–99)
Triglycerides: 182 mg/dL — ABNORMAL HIGH (ref 0–149)
VLDL Cholesterol Cal: 32 mg/dL (ref 5–40)

## 2023-01-09 LAB — CMP14+EGFR
ALT: 17 [IU]/L (ref 0–32)
AST: 16 [IU]/L (ref 0–40)
Albumin: 4 g/dL (ref 3.9–4.9)
Alkaline Phosphatase: 118 [IU]/L (ref 44–121)
BUN/Creatinine Ratio: 18 (ref 9–23)
BUN: 11 mg/dL (ref 6–24)
Bilirubin Total: <0.2 mg/dL (ref 0.0–1.2)
CO2: 18 mmol/L — ABNORMAL LOW (ref 20–29)
Calcium: 8.8 mg/dL (ref 8.7–10.2)
Chloride: 105 mmol/L (ref 96–106)
Creatinine, Ser: 0.6 mg/dL (ref 0.57–1.00)
Globulin, Total: 2.9 g/dL (ref 1.5–4.5)
Glucose: 108 mg/dL — ABNORMAL HIGH (ref 70–99)
Potassium: 4.2 mmol/L (ref 3.5–5.2)
Sodium: 139 mmol/L (ref 134–144)
Total Protein: 6.9 g/dL (ref 6.0–8.5)
eGFR: 112 mL/min/{1.73_m2} (ref >59)

## 2023-01-11 LAB — CYTOLOGY - PAP
Chlamydia: NEGATIVE
Comment: NEGATIVE
Comment: NEGATIVE
Comment: NORMAL
Diagnosis: NEGATIVE
High risk HPV: NEGATIVE
Neisseria Gonorrhea: NEGATIVE

## 2023-01-19 ENCOUNTER — Other Ambulatory Visit: Payer: Self-pay

## 2023-01-22 ENCOUNTER — Other Ambulatory Visit: Payer: Self-pay

## 2023-01-23 DIAGNOSIS — E781 Pure hyperglyceridemia: Secondary | ICD-10-CM | POA: Insufficient documentation

## 2023-01-23 DIAGNOSIS — Z7689 Persons encountering health services in other specified circumstances: Secondary | ICD-10-CM | POA: Insufficient documentation

## 2023-01-23 DIAGNOSIS — M546 Pain in thoracic spine: Secondary | ICD-10-CM | POA: Insufficient documentation

## 2023-01-23 DIAGNOSIS — Z23 Encounter for immunization: Secondary | ICD-10-CM | POA: Insufficient documentation

## 2023-01-23 DIAGNOSIS — Z1211 Encounter for screening for malignant neoplasm of colon: Secondary | ICD-10-CM | POA: Insufficient documentation

## 2023-01-23 DIAGNOSIS — M62838 Other muscle spasm: Secondary | ICD-10-CM | POA: Insufficient documentation

## 2023-01-23 NOTE — Assessment & Plan Note (Signed)
Low carb diet and exercise advised

## 2023-01-23 NOTE — Assessment & Plan Note (Signed)
Followed by Cardiology 

## 2023-01-23 NOTE — Assessment & Plan Note (Signed)
Take meds as prescribed with food

## 2023-01-29 ENCOUNTER — Other Ambulatory Visit: Payer: Self-pay

## 2023-02-05 ENCOUNTER — Ambulatory Visit: Payer: No Typology Code available for payment source | Admitting: Family

## 2023-02-15 ENCOUNTER — Ambulatory Visit: Payer: Self-pay | Admitting: Internal Medicine

## 2023-03-12 ENCOUNTER — Ambulatory Visit: Payer: Self-pay

## 2023-03-12 VITALS — Ht 63.0 in | Wt 224.0 lb

## 2023-03-12 DIAGNOSIS — Z1211 Encounter for screening for malignant neoplasm of colon: Secondary | ICD-10-CM

## 2023-03-12 NOTE — Progress Notes (Signed)
No egg or soy allergy known to patient  No issues known to pt with past sedation with any surgeries or procedures Patient denies ever being told they had issues or difficulty with intubation  No FH of Malignant Hyperthermia Pt is not on diet pills Pt is not on  home 02  Pt is not on blood thinners  Pt reports constipation on occasion  No A fib or A flutter Have any cardiac testing pending-- no  LOA: independent  Prep: plenvu sample given at [facility] apt   Patient's chart reviewed by Cathlyn Parsons CNRA prior to previsit and patient appropriate for the [facility].  Previsit completed and red dot placed by patient's name on their procedure day (on provider's schedule).     PV completed with patient. Prep instructions and plenvu sample given at [facility] apt

## 2023-04-09 ENCOUNTER — Encounter: Payer: No Typology Code available for payment source | Admitting: Internal Medicine

## 2023-05-06 ENCOUNTER — Ambulatory Visit (AMBULATORY_SURGERY_CENTER): Payer: Self-pay | Admitting: Internal Medicine

## 2023-05-06 ENCOUNTER — Encounter: Payer: Self-pay | Admitting: Internal Medicine

## 2023-05-06 VITALS — BP 142/82 | HR 69 | Temp 97.7°F | Resp 12 | Ht 63.0 in | Wt 224.0 lb

## 2023-05-06 DIAGNOSIS — K5730 Diverticulosis of large intestine without perforation or abscess without bleeding: Secondary | ICD-10-CM

## 2023-05-06 DIAGNOSIS — Z1211 Encounter for screening for malignant neoplasm of colon: Secondary | ICD-10-CM

## 2023-05-06 MED ORDER — SODIUM CHLORIDE 0.9 % IV SOLN: 500.0000 mL | Freq: Once | INTRAVENOUS | Status: DC

## 2023-05-06 NOTE — Patient Instructions (Signed)
-   Resume previous diet - Continue present medications.  USTED TUVO UN PROCEDIMIENTO ENDOSCPICO HOY EN EL [facility]:   Lea el informe del procedimiento que se le entreg para cualquier pregunta especfica sobre lo que se Dentist.  Si el informe del examen no responde a sus preguntas, por favor llame a su gastroenterlogo para aclararlo.  Si usted solicit que no se le den [employer] de lo que se Clinical cytogeneticist en su procedimiento al [employer] va a cuidar, entonces el informe del procedimiento se ha incluido en un sobre sellado para que usted lo revise despus cuando le sea ms conveniente.   LO QUE PUEDE ESPERAR: Algunas sensaciones de hinchazn en el abdomen.  Puede tener ms gases de lo normal.  El caminar puede ayudarle a eliminar el aire que se le puso en el tracto gastrointestinal durante el procedimiento y reducir la hinchazn.  Si le hicieron una endoscopia inferior (como una colonoscopia o una sigmoidoscopia flexible), podra notar manchas de sangre en las heces fecales o en el papel higinico.  Si se someti a una preparacin intestinal para su procedimiento, es posible que no tenga una evacuacin intestinal normal durante [employer].   Tenga en cuenta:  Es posible que note un poco de irritacin y congestin en la nariz o algn drenaje.  Esto es debido al oxgeno [employer] durante su procedimiento.  No hay que preocuparse y esto debe desaparecer ms o Regulatory affairs officer.   SNTOMAS PARA REPORTAR INMEDIATAMENTE:  Despus de una endoscopia inferior (colonoscopia o sigmoidoscopia flexible):  Cantidades excesivas de sangre en las heces fecales  Sensibilidad significativa o empeoramiento de los dolores abdominales   Hinchazn aguda del abdomen que antes no tena   Fiebre de 100F o ms   Para asuntos urgentes o de Associate Professor, puede comunicarse con un gastroenterlogo a cualquier hora llamando al [phone].  DIETA:  Recomendamos una comida pequea al  principio, pero luego puede continuar con su dieta normal.  Tome muchos lquidos, Tax adviser las bebidas alcohlicas durante 24 horas.    ACTIVIDAD:  Debe planear tomarse las cosas con calma por el resto del da y no debe CONDUCIR ni usar maquinaria pesada Patent examiner (debido a los medicamentos de sedacin utilizados durante el examen).     SEGUIMIENTO: Nuestro personal llamar al nmero que aparece en su historial al siguiente da hbil de su procedimiento para ver cmo se siente y para responder cualquier pregunta o inquietud que pueda tener con respecto a la informacin que se le dio despus del procedimiento. Si no podemos contactarle, le dejaremos un mensaje.  Sin embargo, si se siente bien y no tiene English as a second language teacher, no es necesario que nos devuelva la llamada.  Asumiremos que ha regresado a sus actividades diarias normales sin incidentes. Si se le tomaron algunas biopsias, le contactaremos por telfono o por carta en las prximas 3 semanas.  Si no ha sabido [employer] biopsias en el transcurso de 3 semanas, por favor llmenos al [phone].   FIRMAS/CONFIDENCIALIDAD: Usted y/o el acompaante que le cuide han firmado documentos que se ingresarn en su historial mdico electrnico.  Estas firmas atestiguan el hecho de que la informacin anterior

## 2023-05-06 NOTE — Op Note (Signed)
 [facility] Patient Name: Katherine [name] Procedure Date: 05/06/2023 9:17 AM MRN: [id number] Endoscopist: Wilhemina Bonito. Marina Goodell , MD, [id number] Age: 48 Referring MD:  Date of Birth: [birth year] Gender: Female Account #: [id number] Procedure:                Colonoscopy Indications:              Screening for colorectal malignant neoplasm Medicines:                Monitored Anesthesia Care Procedure:                Pre-Anesthesia Assessment:                           - Prior to the procedure, a History and Physical                            was performed, and patient medications and                            allergies were reviewed. The patient's tolerance of                            previous anesthesia was also reviewed. The risks                            and benefits of the procedure and the sedation                            options and risks were discussed with the patient.                            All questions were answered, and informed consent                            was obtained. Prior Anticoagulants: The patient has                            taken no anticoagulant or antiplatelet agents. ASA                            Grade Assessment: II - A patient with mild systemic                            disease. After reviewing the risks and benefits,                            the patient was deemed in satisfactory condition to                            undergo the procedure.                           After obtaining informed consent, the colonoscope  was passed under direct vision. Throughout the                            procedure, the patient's blood pressure, pulse, and                            oxygen saturations were monitored continuously. The                            Olympus Scope [id number] was introduced through the                            anus and advanced to the the cecum, identified by                             appendiceal orifice and ileocecal valve. The                            ileocecal valve, appendiceal orifice, and rectum                            were photographed. The quality of the bowel                            preparation was excellent. The colonoscopy was                            performed without difficulty. The patient tolerated                            the procedure well. The bowel preparation used was                            SUPREP via split dose instruction. Scope In: 9:45:52 AM Scope Out: 9:56:44 AM Scope Withdrawal Time: 0 hours 9 minutes 18 seconds  Total Procedure Duration: 0 hours 10 minutes 52 seconds  Findings:                 Multiple diverticula were found in the cecum and                            left colon.                           The exam was otherwise without abnormality on                            direct and retroflexion views. Complications:            No immediate complications. Estimated blood loss:                            None. Estimated Blood Loss:     Estimated blood loss: none. Impression:               - Diverticulosis in the  cecum and in the left colon.                           - The examination was otherwise normal on direct                            and retroflexion views.                           - No specimens collected. Recommendation:           - Repeat colonoscopy in 10 years for screening                            purposes.                           - Patient has a contact number available for                            emergencies. The signs and symptoms of potential                            delayed complications were discussed with the                            patient. Return to normal activities tomorrow.                            Written discharge instructions were provided to the                            patient.                           - Resume previous diet.                           - Continue present  medications. Wilhemina Bonito. Marina Goodell, MD 05/06/2023 10:00:04 AM This report has been signed electronically.

## 2023-05-06 NOTE — Progress Notes (Signed)
 HISTORY OF PRESENT ILLNESS:  Katherine [name] is a 48 y.o. female sent for routine screening colonoscopy.  No complaints  REVIEW OF SYSTEMS:  All non-GI ROS negative except for  Past Medical History:  Diagnosis Date   Abdominal pain    Anemia    Depression    postpartum depression   Morbid obesity (HCC)    Splenomegaly     Past Surgical History:  Procedure Laterality Date   nexplanon     ROBOTIC ASSISTED LAPAROSCOPIC VENTRAL/INCISIONAL HERNIA REPAIR N/A 01/04/2018   Procedure: ROBOTIC ASSISTED LAPAROSCOPIC VENTRAL/INCISIONAL HERNIA REPAIR;  Surgeon: Leafy Ro, MD;  Location: [facility] ORS;  Service: General;  Laterality: N/A;   ROBOTIC ASSISTED SALPINGO OOPHERECTOMY Bilateral 01/04/2018   Procedure: ROBOTIC ASSISTED SALPINGECTOMY;  Surgeon: Vena Austria, MD;  Location: [facility] ORS;  Service: Gynecology;  Laterality: Bilateral;   TUBAL LIGATION     UPPER GASTROINTESTINAL ENDOSCOPY     UPPER GI ENDOSCOPY  2015    Social History Katherine [name]  reports that she has never smoked. She has never been exposed to tobacco smoke. She has never used smokeless tobacco. She reports current alcohol use. She reports that she does not use drugs.  family history includes Diabetes in her mother; Kidney disease in her brother.  No Known Allergies     PHYSICAL EXAMINATION: Vital signs: BP 134/76   Pulse (!) 58   Temp 97.7 F (36.5 C) (Temporal)   Ht 5\' 3"  (1.6 m)   Wt 224 lb (101.6 kg)   LMP 04/29/2023 (Exact Date) Comment: Tubal ligation  SpO2 98%   BMI 39.68 kg/m  General: Well-developed, well-nourished, no acute distress HEENT: Sclerae are anicteric, conjunctiva pink. Oral mucosa intact Lungs: Clear Heart: Regular Abdomen: soft, nontender, nondistended, no obvious ascites, no peritoneal signs, normal bowel sounds. No organomegaly. Extremities: No edema Psychiatric: alert and oriented x3. Cooperative     ASSESSMENT:  Colon cancer  screening   PLAN:   Screening colonoscopy

## 2023-05-06 NOTE — Progress Notes (Signed)
 Patient experienced abdominal pain upon waking up. Levsin administered, patient able to pass gas. Patient stated feels like she bloated. Nurse educated patient on importance of relieving gas from colon. Patient verbalizes understanding. No new concerns at this time.

## 2023-05-06 NOTE — Progress Notes (Signed)
 Pt's states no medical or surgical changes since previsit or office visit.

## 2023-05-06 NOTE — Progress Notes (Signed)
 Sedate, gd SR, tolerated procedure well, VSS, report to RN

## 2023-05-07 ENCOUNTER — Telehealth: Payer: Self-pay

## 2023-05-07 NOTE — Telephone Encounter (Signed)
  Follow up Call-     05/06/2023    8:30 AM  Call back number  Post procedure Call Back phone  # Lafonda Mosses daughter speaks [place] [phone]  Permission to leave phone message Yes     Patient questions:  Do you have a fever, pain , or abdominal swelling? No. Pain Score  0 *  Have you tolerated food without any problems? Yes.    Have you been able to return to your normal activities? Yes.    Do you have any questions about your discharge instructions: Diet   No. Medications  No. Follow up visit  No.  Do you have questions or concerns about your Care? No.  Actions: * If pain score is 4 or above: No action needed, pain <4.

## 2023-05-14 ENCOUNTER — Encounter: Payer: No Typology Code available for payment source | Admitting: Family Medicine

## 2023-05-20 ENCOUNTER — Encounter: Payer: Self-pay | Admitting: Family Medicine

## 2023-05-20 ENCOUNTER — Ambulatory Visit (INDEPENDENT_AMBULATORY_CARE_PROVIDER_SITE_OTHER): Admitting: Family Medicine

## 2023-05-20 VITALS — BP 120/80 | HR 65 | Temp 98.1°F | Ht 63.0 in | Wt 220.0 lb

## 2023-05-20 DIAGNOSIS — E66812 Obesity, class 2: Secondary | ICD-10-CM

## 2023-05-20 DIAGNOSIS — H5711 Ocular pain, right eye: Secondary | ICD-10-CM

## 2023-05-20 DIAGNOSIS — R519 Headache, unspecified: Secondary | ICD-10-CM

## 2023-05-20 DIAGNOSIS — Z6838 Body mass index (BMI) 38.0-38.9, adult: Secondary | ICD-10-CM

## 2023-05-20 DIAGNOSIS — E781 Pure hyperglyceridemia: Secondary | ICD-10-CM

## 2023-05-20 NOTE — Progress Notes (Signed)
 I,Jameka J Llittleton, CMA,acting as a Neurosurgeon for [employer], NP.,have documented all relevant documentation on the behalf of Melodie Spry, NP,as directed by  Melodie Spry, NP while in the presence of Melodie Spry, NP.  Subjective:  Patient ID: Katherine [name] , female    DOB: 26-Aug-1975 , 48 y.o.   MRN: [id number]  Chief Complaint  Patient presents with   Hyperlipidemia   Headache    HPI  Patient is a 48 year old female who presents today for eye pain and headaches that started on Monday. 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  She reports the pain is not as severe today. She denies any vision loss, fever or body aches.Patient speaks spanish and the interpreter today is Ms Veva Gower from [facility].     Past Medical History:  Diagnosis Date   Abdominal pain    Anemia    Depression    postpartum depression   Morbid obesity (HCC)    Splenomegaly      Family History  Problem Relation Age of Onset   Diabetes Mother    Kidney disease Brother    Colon cancer Neg Hx    Rectal cancer Neg Hx    Stomach cancer Neg Hx      Current Outpatient Medications:    Rimegepant Sulfate (NURTEC) 75 MG TBDP, Take 1 tablet (75 mg total) by mouth as needed., Disp: 1 tablet, Rfl: 0   ibuprofen  (ADVIL ) 600 MG tablet, Take 1 tablet (600 mg total) by mouth every 6 (six) hours as needed. (Patient not taking: Reported on 05/20/2023), Disp: 30 tablet, Rfl: 0   Iron , Ferrous Sulfate , 325 (65 Fe) MG TABS, Take 1 tablet by mouth daily. (Patient not taking: Reported on 05/20/2023), Disp: 90 tablet, Rfl: 1   No Known Allergies   Review of Systems  Constitutional: Negative.   HENT: Negative.  Negative for sinus pressure, sinus pain, sneezing and sore throat.   Eyes:  Positive for pain. Negative for photophobia, discharge, redness, itching and visual disturbance.   Respiratory: Negative.    Cardiovascular: Negative.   Gastrointestinal: Negative.   Musculoskeletal: Negative.   Neurological:  Positive for headaches.  Psychiatric/Behavioral: Negative.       Today's Vitals   05/20/23 1119  BP: 120/80  Pulse: 65  Temp: 98.1 F (36.7 C)  TempSrc: Oral  Weight: 220 lb (99.8 kg)  Height: 5\' 3"  (1.6 m)  PainSc: 2    Body mass index is 38.97 kg/m.  Wt Readings from Last 3 Encounters:  05/20/23 220 lb (99.8 kg)  05/06/23 224 lb (101.6 kg)  03/12/23 224 lb (101.6 kg)    The 10-year ASCVD risk score (Arnett DK, et al., 2019) is: 1.1%   Values used to calculate the score:     Age: 72 years     Sex: Female     Is Non-Hispanic African American: No     Diabetic: No     Tobacco smoker: No     Systolic Blood Pressure: 120 mmHg     Is BP treated: No     HDL Cholesterol: 34 mg/dL     Total Cholesterol: 159 mg/dL  Objective:  Physical Exam HENT:     Head: Normocephalic.  Eyes:     Extraocular Movements:     Right eye: Normal extraocular motion.  Left eye: Normal extraocular motion.  Pulmonary:     Effort: Pulmonary effort is normal.     Breath sounds: Normal breath sounds.  Skin:    General: Skin is warm and dry.  Neurological:     Mental Status: She is alert.         Assessment And Plan:  Eye pain, right -     Ambulatory referral to Ophthalmology  Headache disorder Assessment & Plan: Gave samples of nurtec 75 mg ODT use PRN.  Orders: -     Nurtec; Take 1 tablet (75 mg total) by mouth as needed.  Dispense: 1 tablet; Refill: 0  Hypertriglyceridemia Assessment & Plan: Lipid Panel     Component Value Date/Time   CHOL 159 01/08/2023 1157   TRIG 182 (H) 01/08/2023 1157   HDL 34 (L) 01/08/2023 1157   CHOLHDL 4.7 (H) 01/08/2023 1157   CHOLHDL 3.2 03/23/2022 1013   VLDL 38 03/23/2022 1013   LDLCALC 93 01/08/2023 1157   LABVLDL 32 01/08/2023 1157   Diet and exercise advised.   Class 2 obesity with body mass index (BMI)  of 38.0 to 38.9 in adult, unspecified obesity type, unspecified whether serious comorbidity present Assessment & Plan: She is encouraged to strive for BMI less than 30 to decrease cardiac risk. Advised to aim for at least 150 minutes of exercise per week.      Return in 3 months (on 08/19/2023), or if symptoms worsen or fail to improve, for physical.  Patient was given opportunity to ask questions. Patient verbalized understanding of the plan and was able to repeat key elements of the plan. All questions were answered to their satisfaction.   I, Melodie Spry, NP, have reviewed all documentation for this visit. The documentation on 05/31/2023 for the exam, diagnosis, procedures, and orders are all accurate and complete.   IF YOU HAVE BEEN REFERRED TO A SPECIALIST, IT MAY TAKE 1-2 WEEKS TO SCHEDULE/PROCESS THE REFERRAL. IF YOU HAVE NOT HEARD FROM US /SPECIALIST IN TWO WEEKS, PLEASE GIVE US  A CALL AT [phone].

## 2023-05-31 ENCOUNTER — Other Ambulatory Visit: Payer: Self-pay

## 2023-05-31 DIAGNOSIS — H5711 Ocular pain, right eye: Secondary | ICD-10-CM | POA: Insufficient documentation

## 2023-05-31 DIAGNOSIS — R519 Headache, unspecified: Secondary | ICD-10-CM | POA: Insufficient documentation

## 2023-05-31 DIAGNOSIS — E66812 Obesity, class 2: Secondary | ICD-10-CM | POA: Insufficient documentation

## 2023-05-31 MED ORDER — NURTEC 75 MG PO TBDP
1.0000 | ORAL_TABLET | ORAL | 0 refills | Status: AC | PRN
  Filled 2023-05-31: qty 1, fill #0
  Filled 2024-02-10: qty 1, 1d supply, fill #0

## 2023-05-31 NOTE — Assessment & Plan Note (Signed)
 She is encouraged to strive for BMI less than 30 to decrease cardiac risk. Advised to aim for at least 150 minutes of exercise per week.

## 2023-05-31 NOTE — Assessment & Plan Note (Signed)
 Gave samples of nurtec 75 mg ODT use PRN.

## 2023-05-31 NOTE — Assessment & Plan Note (Signed)
 Lipid Panel     Component Value Date/Time   CHOL 159 01/08/2023 1157   TRIG 182 (H) 01/08/2023 1157   HDL 34 (L) 01/08/2023 1157   CHOLHDL 4.7 (H) 01/08/2023 1157   CHOLHDL 3.2 03/23/2022 1013   VLDL 38 03/23/2022 1013   LDLCALC 93 01/08/2023 1157   LABVLDL 32 01/08/2023 1157   Diet and exercise advised.

## 2023-06-02 ENCOUNTER — Encounter: Payer: Self-pay | Admitting: Podiatry

## 2023-06-04 ENCOUNTER — Ambulatory Visit: Admitting: Podiatry

## 2023-06-18 ENCOUNTER — Other Ambulatory Visit: Payer: Self-pay

## 2023-06-18 ENCOUNTER — Ambulatory Visit (INDEPENDENT_AMBULATORY_CARE_PROVIDER_SITE_OTHER): Admitting: Podiatry

## 2023-06-18 ENCOUNTER — Ambulatory Visit (INDEPENDENT_AMBULATORY_CARE_PROVIDER_SITE_OTHER)

## 2023-06-18 DIAGNOSIS — M79604 Pain in right leg: Secondary | ICD-10-CM

## 2023-06-18 DIAGNOSIS — M7751 Other enthesopathy of right foot: Secondary | ICD-10-CM

## 2023-06-18 MED ORDER — METHYLPREDNISOLONE 4 MG PO TBPK
ORAL_TABLET | ORAL | 0 refills | Status: DC
  Filled 2023-06-18: qty 21, 6d supply, fill #0

## 2023-06-22 NOTE — Progress Notes (Signed)
  Subjective:  Patient ID: Katherine [name], female    DOB: 05/24/1975,  MRN: [id number]  Chief Complaint  Patient presents with   Foot Pain    Patient is here for right foot pain, started two wks ago. Pain and swelling on the lateral side of the right foot and right knee.    Discussed the use of AI scribe software for clinical note transcription with the patient, who gave verbal consent to proceed.  History of Present Illness Jeffery Gammell is a 48 year old female who presents with right foot pain for the last two weeks.  She experiences constant right foot pain that worsens with ambulation. Pain medication and warm compresses have been used for relief. She sometimes feels swelling she gets heaviness from her knee down.  She points more on the knee where she gets discomfort as well.      Objective:    Physical Exam General: AAO x3, NAD  Dermatological: Skin is warm, dry and supple bilateral. There are no open sores, no preulcerative lesions, no rash or signs of infection present.  Vascular: Dorsalis Pedis artery and Posterior Tibial artery pedal pulses are 2/4 bilateral with immedate capillary fill time.  The calf is supple but there is mild discomfort along the calf.  Neruologic: Grossly intact via light touch bilateral.   Musculoskeletal: Unable to appreciate any area pinpoint tenderness but she does get diffuse tenderness and swelling most of the lateral aspect.  There is no erythema or warmth associated with this.  Flexor, extensor tendons to be intact.  There is slight edema going into the leg.  Calf is supple.  Gait: Unassisted, Nonantalgic.    No images are attached to the encounter.    Results RADIOLOGY Right ankle X-ray: No abnormalities detected (06/18/2023)   Assessment:   1. Capsulitis of right ankle   2. Right leg pain      Plan:  Patient was evaluated and treated and all questions answered.  Assessment and Plan Assessment & Plan Right  foot pain with inflammation Pain and swelling in right foot, exacerbated by ambulation. X-rays unremarkable. Differential includes unlikely blood clots. Plan to address inflammation and rule out vascular issues. - Order leg ultrasound to rule out blood clots. - Prescribe Medrol Dose Pack for inflammation. - Provide walking boot to reduce foot pressure. - Advise elevation and ice application to affected area.  Follow-up Plan to assess treatment response and determine further management. Possible referral to orthopedic specialist if knee pain persists. - Schedule follow-up in three weeks. - Advise return to regular shoe wear if symptoms improve.   Return in about 3 weeks (around 07/09/2023) for right ankle pain.   Charity Conch DPM

## 2023-06-23 ENCOUNTER — Ambulatory Visit (HOSPITAL_COMMUNITY)
Admission: RE | Admit: 2023-06-23 | Discharge: 2023-06-23 | Disposition: A | Discharge status: Home / Self Care | Payer: Self-pay | Source: Ambulatory Visit | Attending: Podiatry | Admitting: Podiatry

## 2023-06-23 DIAGNOSIS — M79604 Pain in right leg: Secondary | ICD-10-CM

## 2023-06-25 ENCOUNTER — Ambulatory Visit: Payer: Self-pay | Admitting: Podiatry

## 2023-07-09 ENCOUNTER — Other Ambulatory Visit (INDEPENDENT_AMBULATORY_CARE_PROVIDER_SITE_OTHER): Payer: Self-pay

## 2023-07-09 ENCOUNTER — Ambulatory Visit (INDEPENDENT_AMBULATORY_CARE_PROVIDER_SITE_OTHER): Payer: Self-pay | Admitting: Orthopedic Surgery

## 2023-07-09 ENCOUNTER — Encounter: Payer: Self-pay | Admitting: Orthopedic Surgery

## 2023-07-09 DIAGNOSIS — M65961 Unspecified synovitis and tenosynovitis, right lower leg: Secondary | ICD-10-CM

## 2023-07-09 DIAGNOSIS — M25561 Pain in right knee: Secondary | ICD-10-CM

## 2023-07-09 DIAGNOSIS — G8929 Other chronic pain: Secondary | ICD-10-CM

## 2023-07-09 DIAGNOSIS — M5441 Lumbago with sciatica, right side: Secondary | ICD-10-CM

## 2023-07-09 NOTE — Progress Notes (Unsigned)
 Office Visit Note   Patient: Katherine [name]           Date of Birth: 09-16-1975           MRN: [id number] Visit Date: 07/09/2023 Requested by: Melodie Spry, NP [address] [place],  [place] [address] PCP: Melodie Spry, NP  Subjective: Chief Complaint  Patient presents with   Right Leg - Pain   Right Knee - Pain    HPI: Katherine [name] is a 48 y.o. female who presents to the office reporting right knee pain of 2 months duration and back pain of 2 years duration.  The right knee has been going on for about 2 months with no known history of injury.  Does report some weakness and giving way as well as some locking symptoms.  Does not wake her up from sleep at night.  Her back is much worse than the knee.  Patient also describes radicular pain down that right-hand side along with numbness and tingling.  Does radiate down to the foot.  Tried ibuprofen  without much relief.  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  Hurts for her to pick things up.  Using a back support brace does help.  Does hurt her cough and sneeze.  Walking hurts her more than sitting..                ROS: All systems reviewed are negative as they relate to the chief complaint within the history of present illness.  Patient denies fevers or chills.  Assessment & Plan: Visit Diagnoses:  1. Right knee pain, unspecified chronicity   2. Chronic right-sided low back pain with right-sided sciatica     Plan: Impression is right knee pain with possible meniscal pathology.  Radiographs are underwhelming in terms of any bony findings.  Overall they look good.  Plan is aspiration and injection of that right knee today.  Regarding the back she does not have any red flag symptoms today.  No fevers or chills.  No weakness.  Would like for her to do therapy for several weeks and then return in 4 weeks with decision for or against MRI  scanning and possible ESI of the back at that time.  We can also recheck to see how her knee is doing.  Follow-Up Instructions: Return in about 4 weeks (around 08/06/2023).   Orders:  Orders Placed This Encounter  Procedures   XR KNEE 3 VIEW RIGHT   XR Lumbar Spine 2-3 Views   Ambulatory referral to Physical Therapy   No orders of the defined types were placed in this encounter.     Procedures: Large Joint Inj: R knee on 07/09/2023 6:15 PM Indications: diagnostic evaluation, joint swelling and pain Details: 18 G 1.5 in needle, superolateral approach  Arthrogram: No  Medications: 5 mL lidocaine  1 %; 4 mL bupivacaine  0.25 %; 40 mg triamcinolone  acetonide 40 MG/ML Outcome: tolerated well, no immediate complications Procedure, treatment alternatives, risks and benefits explained, specific risks discussed. Consent was given by the patient. Immediately prior to procedure a time out was called to verify the correct patient, procedure, equipment, support staff and site/side marked as required. Patient was prepped and draped in the usual sterile fashion.       Clinical Data: No additional findings.  Objective: Vital Signs: There were no vitals taken for this visit.  Physical Exam:  Constitutional: Patient appears well-developed HEENT:  Head: Normocephalic Eyes:EOM are normal Neck: Normal range of motion Cardiovascular: Normal rate Pulmonary/chest: Effort normal Neurologic: Patient is alert Skin: Skin is warm Psychiatric: Patient has normal mood and affect  Ortho Exam: Ortho exam demonstrates positive nerve root tension signs on both the right and the left.  Patient is 5 out of 5 ankle dorsiflexion plantarflexion quad hamstring strength along with hip flexor strength.  No groin pain with internal/external Tatian of the leg.  No definite paresthesias L1 S1 bilaterally.  Reflexes 0 to 1+ out of 4 bilateral patella and Achilles.  Right knee demonstrates trace effusion with full range  of motion medial greater than lateral joint line tenderness with positive McMurray compression testing.  Collateral cruciate ligaments are stable.  No patellar apprehension.  Mild but symmetric patellofemoral crepitus present.  Specialty Comments:  No specialty comments available.  Imaging: XR Lumbar Spine 2-3 Views Result Date: 07/09/2023 AP lateral radiographs lumbar spine reviewed.  Normal lordosis is present.  No acute fracture.  No spondylolisthesis.  Mild to moderate facet arthritis is present at L4-5 and L5-S1.  No significant degenerative disc disease is present between the vertebral bodies or around the other facet joints.  XR KNEE 3 VIEW RIGHT Result Date: 07/09/2023 AP lateral merchant radiographs of the right knee are reviewed.  No acute fracture.  No dislocation.  Alignment normal.  No significant degenerative changes in the medial lateral or patellofemoral compartments.     PMFS History: Patient Active Problem List   Diagnosis Date Noted   Eye pain, right 05/31/2023   Headache disorder 05/31/2023   Class 2 obesity with body mass index (BMI) of 38.0 to 38.9 in adult 05/31/2023   Bilateral thoracic back pain 01/23/2023   Screening for colon cancer 01/23/2023   Muscle spasm 01/23/2023   Establishing care with new doctor, encounter for 01/23/2023   Immunization due 01/23/2023   Hypertriglyceridemia 01/23/2023   Cervical polyp 01/05/2023   Menorrhagia, premenopausal 09/29/2022   Heart failure with preserved ejection fraction (HCC) 09/29/2022   Vaginal discharge 09/29/2022   Chest pain 03/23/2022   History of COVID-19 05/03/2019   Herpes zoster without complication 05/03/2019   Chronic bilateral low back pain without sciatica 05/03/2019   Ventral hernia without obstruction or gangrene    Spleen enlarged 07/31/2013   History of anemia 07/31/2013   Dental caries 09/06/2012   Past Medical History:  Diagnosis Date   Abdominal pain    Anemia    Depression    postpartum  depression   Morbid obesity (HCC)    Splenomegaly     Family History  Problem Relation Age of Onset   Diabetes Mother    Kidney disease Brother    Colon cancer Neg Hx    Rectal cancer Neg Hx    Stomach cancer Neg Hx     Past Surgical History:  Procedure Laterality Date   nexplanon     ROBOTIC ASSISTED LAPAROSCOPIC VENTRAL/INCISIONAL HERNIA REPAIR N/A 01/04/2018   Procedure: ROBOTIC ASSISTED LAPAROSCOPIC VENTRAL/INCISIONAL HERNIA REPAIR;  Surgeon: Alben Alma, MD;  Location: [facility];  Service: General;  Laterality: N/A;   ROBOTIC ASSISTED SALPINGO OOPHERECTOMY Bilateral 01/04/2018   Procedure: ROBOTIC ASSISTED SALPINGECTOMY;  Surgeon: Darl Edu, MD;  Location: [facility];  Service: Gynecology;  Laterality: Bilateral;   TUBAL LIGATION     UPPER GASTROINTESTINAL ENDOSCOPY     UPPER GI ENDOSCOPY  2015   Social History   Occupational History   Not on file  Tobacco Use   Smoking  status: Never    Passive exposure: Never   Smokeless tobacco: Never  Vaping Use   Vaping status: Never Used  Substance and Sexual Activity   Alcohol use: Yes    Comment: occ   Drug use: No   Sexual activity: Yes    Birth control/protection: Surgical    Comment: Tubal ligation

## 2023-07-10 MED ORDER — TRIAMCINOLONE ACETONIDE 40 MG/ML IJ SUSP
40.0000 mg | INTRAMUSCULAR | Status: AC | PRN
  Administered 2023-07-09: 40 mg via INTRA_ARTICULAR

## 2023-07-10 MED ORDER — LIDOCAINE HCL 1 % IJ SOLN
5.0000 mL | INTRAMUSCULAR | Status: AC | PRN
  Administered 2023-07-09: 5 mL

## 2023-07-10 MED ORDER — BUPIVACAINE HCL 0.25 % IJ SOLN
4.0000 mL | INTRAMUSCULAR | Status: AC | PRN
  Administered 2023-07-09: 4 mL via INTRA_ARTICULAR

## 2023-07-16 ENCOUNTER — Ambulatory Visit: Admitting: Podiatry

## 2023-07-22 ENCOUNTER — Ambulatory Visit
Admission: RE | Admit: 2023-07-22 | Discharge: 2023-07-22 | Disposition: A | Discharge status: Home / Self Care | Source: Ambulatory Visit | Attending: Family Medicine | Admitting: Family Medicine

## 2023-07-22 VITALS — BP 152/93 | HR 55 | Temp 99.0°F | Resp 18

## 2023-07-22 DIAGNOSIS — H1131 Conjunctival hemorrhage, right eye: Secondary | ICD-10-CM

## 2023-07-22 NOTE — ED Triage Notes (Signed)
 Pt reports pain redness and swelling in eye right eye since this morning.

## 2023-07-22 NOTE — ED Provider Notes (Signed)
 [facility]  Note:  This document was prepared using Conservation officer, historic buildings and may include unintentional dictation errors.  MRN: [id number] DOB: 07/18/1975  Subjective:   Katherine [name] is a 48 y.o. female presenting for acute onset of redness over the right eye this morning.  No fever, pain, swelling, itching, vision changes.  Patient cannot recall any particular trauma to the eye.  Does not wear contact lenses.  No current facility-administered medications for this encounter.  Current Outpatient Medications:    ibuprofen  (ADVIL ) 600 MG tablet, Take 1 tablet (600 mg total) by mouth every 6 (six) hours as needed., Disp: 30 tablet, Rfl: 0   Iron , Ferrous Sulfate , 325 (65 Fe) MG TABS, Take 1 tablet by mouth daily., Disp: 90 tablet, Rfl: 1   methylPREDNISolone  (MEDROL  DOSEPAK) 4 MG TBPK tablet, Take as directed, Disp: 21 tablet, Rfl: 0   Rimegepant Sulfate  (NURTEC) 75 MG TBDP, Take 1 tablet (75 mg total) by mouth as needed., Disp: 1 tablet, Rfl: 0   No Known Allergies  Past Medical History:  Diagnosis Date   Abdominal pain    Anemia    Depression    postpartum depression   Morbid obesity (HCC)    Splenomegaly      Past Surgical History:  Procedure Laterality Date   nexplanon     ROBOTIC ASSISTED LAPAROSCOPIC VENTRAL/INCISIONAL HERNIA REPAIR N/A 01/04/2018   Procedure: ROBOTIC ASSISTED LAPAROSCOPIC VENTRAL/INCISIONAL HERNIA REPAIR;  Surgeon: Jordis Laneta FALCON, MD;  Location: [facility];  Service: General;  Laterality: N/A;   ROBOTIC ASSISTED SALPINGO OOPHERECTOMY Bilateral 01/04/2018   Procedure: ROBOTIC ASSISTED SALPINGECTOMY;  Surgeon: Lake Read, MD;  Location: [facility];  Service: Gynecology;  Laterality: Bilateral;   TUBAL LIGATION     UPPER GASTROINTESTINAL ENDOSCOPY     UPPER GI ENDOSCOPY  2015    Family History  Problem Relation Age of Onset   Diabetes Mother    Kidney disease Brother    Colon cancer Neg Hx    Rectal  cancer Neg Hx    Stomach cancer Neg Hx     Social History   Tobacco Use   Smoking status: Never    Passive exposure: Never   Smokeless tobacco: Never  Vaping Use   Vaping status: Never Used  Substance Use Topics   Alcohol use: Yes    Comment: occ   Drug use: No    ROS   Objective:   Vitals: BP (!) 152/93 (BP Location: Right Arm)   Pulse (!) 55   Temp 99 F (37.2 C) (Oral)   Resp 18   LMP 07/21/2023 (Exact Date)   SpO2 98%   Physical Exam Constitutional:      General: She is not in acute distress.    Appearance: Normal appearance. She is well-developed. She is not ill-appearing, toxic-appearing or diaphoretic.  HENT:     Head: Normocephalic and atraumatic.     Nose: Nose normal.     Mouth/Throat:     Mouth: Mucous membranes are moist.   Eyes:     General: Lids are normal. Lids are everted, no foreign bodies appreciated. Vision grossly intact. No scleral icterus.       Right eye: No foreign body, discharge or hordeolum.        Left eye: No foreign body, discharge or hordeolum.     Extraocular Movements: Extraocular movements intact.     Right eye: Normal extraocular motion.     Left eye: Normal extraocular motion  and no nystagmus.     Conjunctiva/sclera:     Right eye: Right conjunctiva is not injected. Hemorrhage (medial subconjunctival) present. No chemosis or exudate.    Left eye: Left conjunctiva is not injected. No chemosis, exudate or hemorrhage.    Cardiovascular:     Rate and Rhythm: Normal rate.  Pulmonary:     Effort: Pulmonary effort is normal.   Skin:    General: Skin is warm and dry.   Neurological:     General: No focal deficit present.     Mental Status: She is alert and oriented to person, place, and time.   Psychiatric:        Mood and Affect: Mood normal.        Behavior: Behavior normal.     Assessment and Plan :   PDMP not reviewed this encounter.  1. Subconjunctival hemorrhage of right eye    Anticipatory guidance  provided for subconjunctival hemorrhage of the right eye.   Christopher Savannah, [place] 07/22/23 1456

## 2023-07-23 ENCOUNTER — Ambulatory Visit: Payer: Self-pay | Attending: Orthopedic Surgery

## 2023-07-23 DIAGNOSIS — M5441 Lumbago with sciatica, right side: Secondary | ICD-10-CM | POA: Insufficient documentation

## 2023-07-23 DIAGNOSIS — M6281 Muscle weakness (generalized): Secondary | ICD-10-CM | POA: Insufficient documentation

## 2023-07-23 DIAGNOSIS — M5431 Sciatica, right side: Secondary | ICD-10-CM | POA: Insufficient documentation

## 2023-07-23 DIAGNOSIS — M5459 Other low back pain: Secondary | ICD-10-CM | POA: Insufficient documentation

## 2023-07-23 DIAGNOSIS — G8929 Other chronic pain: Secondary | ICD-10-CM | POA: Insufficient documentation

## 2023-07-23 NOTE — Therapy (Signed)
 OUTPATIENT PHYSICAL THERAPY THORACOLUMBAR EVALUATION   Patient Name: Katherine [name] MRN: [id number] DOB:[birth year], 48 y.o., female Today's Date: 07/23/2023  END OF SESSION:  PT End of Session - 07/23/23 1255     Visit Number 1    Number of Visits 4    Date for PT Re-Evaluation 09/22/23    Authorization Type CAFA    Authorization Time Period 100% covered from 05/11/23-11/11/23    PT Start Time 1300    PT Stop Time 1345    PT Time Calculation (min) 45 min    Activity Tolerance Patient tolerated treatment well    Behavior During Therapy WFL for tasks assessed/performed          Past Medical History:  Diagnosis Date   Abdominal pain    Anemia    Depression    postpartum depression   Morbid obesity (HCC)    Splenomegaly    Past Surgical History:  Procedure Laterality Date   nexplanon     ROBOTIC ASSISTED LAPAROSCOPIC VENTRAL/INCISIONAL HERNIA REPAIR N/A 01/04/2018   Procedure: ROBOTIC ASSISTED LAPAROSCOPIC VENTRAL/INCISIONAL HERNIA REPAIR;  Surgeon: Jordis Laneta FALCON, MD;  Location: [facility];  Service: General;  Laterality: N/A;   ROBOTIC ASSISTED SALPINGO OOPHERECTOMY Bilateral 01/04/2018   Procedure: ROBOTIC ASSISTED SALPINGECTOMY;  Surgeon: Lake Read, MD;  Location: [facility];  Service: Gynecology;  Laterality: Bilateral;   TUBAL LIGATION     UPPER GASTROINTESTINAL ENDOSCOPY     UPPER GI ENDOSCOPY  2015   Patient Active Problem List   Diagnosis Date Noted   Eye pain, right 05/31/2023   Headache disorder 05/31/2023   Class 2 obesity with body mass index (BMI) of 38.0 to 38.9 in adult 05/31/2023   Bilateral thoracic back pain 01/23/2023   Screening for colon cancer 01/23/2023   Muscle spasm 01/23/2023   Establishing care with new doctor, encounter for 01/23/2023   Immunization due 01/23/2023   Hypertriglyceridemia 01/23/2023   Cervical polyp 01/05/2023   Menorrhagia, premenopausal 09/29/2022   Heart failure with preserved ejection fraction (HCC)  09/29/2022   Vaginal discharge 09/29/2022   Chest pain 03/23/2022   History of COVID-19 05/03/2019   Herpes zoster without complication 05/03/2019   Chronic bilateral low back pain without sciatica 05/03/2019   Ventral hernia without obstruction or gangrene    Spleen enlarged 07/31/2013   History of anemia 07/31/2013   Dental caries 09/06/2012    PCP: Petrina Pries, NP   REFERRING PROVIDER: Addie Cordella Hamilton, MD  REFERRING DIAG: [phone] (ICD-10-CM) - Chronic right-sided low back pain with right-sided sciatica  Rationale for Evaluation and Treatment: Rehabilitation  THERAPY DIAG:  Other low back pain  Sciatica, right side  Muscle weakness (generalized)  ONSET DATE: 2+ years  SUBJECTIVE:  SUBJECTIVE STATEMENT: HPI: Katherine [name] is a 48 y.o. female who presents to the office reporting right knee pain of 2 months duration and back pain of 2 years duration.  The right knee has been going on for about 2 months with no known history of injury.  Does report some weakness and giving way as well as some locking symptoms.  Does not wake her up from sleep at night.  Her back is much worse than the knee.   Patient also describes radicular pain down that right-hand side along with numbness and tingling.  Does radiate down to the foot.  Tried ibuprofen  without much relief.  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  Hurts for her to pick things up.  Using a back support brace does help.  Does hurt her cough and sneeze.  Walking hurts her more than sitting.[name]    PERTINENT HISTORY:   Plan: Impression is right knee pain with possible meniscal pathology.  Radiographs are underwhelming in terms of any bony findings.  Overall  they look good.  Plan is aspiration and injection of that right knee today.  Regarding the back she does not have any red flag symptoms today.  No fevers or chills.  No weakness.  Would like for her to do therapy for several weeks and then return in 4 weeks with decision for or against MRI scanning and possible ESI of the back at that time.  We can also recheck to see how her knee is doing.   PAIN:  Are you having pain? Yes: NPRS scale: 9/10 at worse Pain location: Low back and RLE Pain description: ache, sharp Aggravating factors: bending, lifting Relieving factors: ibuprofen  and supine  PRECAUTIONS: None  RED FLAGS: None   WEIGHT BEARING RESTRICTIONS: No  FALLS:  Has patient fallen in last 6 months? No  OCCUPATION: housecleaning  PLOF: Independent  PATIENT GOALS: To manage my back pain  NEXT MD VISIT: 4 weeks  OBJECTIVE:  Note: Objective measures were completed at Evaluation unless otherwise noted.  DIAGNOSTIC FINDINGS:  XR Lumbar Spine 2-3 Views Result Date: 07/09/2023 AP lateral radiographs lumbar spine reviewed.  Normal lordosis is present.  No acute fracture.  No spondylolisthesis.  Mild to moderate facet arthritis is present at L4-5 and L5-S1.  No significant degenerative disc disease is present between the vertebral bodies or around the other facet joints.  PATIENT SURVEYS:  Modified Oswestry: 12/50 24% perceived disability  Interpretation of scores: Score Category Description  0-20% Minimal Disability The patient can cope with most living activities. Usually no treatment is indicated apart from advice on lifting, sitting and exercise  21-40% Moderate Disability The patient experiences more pain and difficulty with sitting, lifting and standing. Travel and social life are more difficult and they may be disabled from work. Personal care, sexual activity and sleeping are not grossly affected, and the patient can usually be managed by conservative means  41-60% Severe  Disability Pain remains the main problem in this group, but activities of daily living are affected. These patients require a detailed investigation  61-80% Crippled Back pain impinges on all aspects of the patient's life. Positive intervention is required  81-100% Bed-bound  These patients are either bed-bound or exaggerating their symptoms  [name] FORBES Zoe DELENA Karon DELENA, et al. Surgery versus conservative management of stable thoracolumbar fracture: the PRESTO feasibility RCT. [place] [employer]; 2021 Nov. [facility] Technology Assessment,  No. 25.62.) Appendix 3, Oswestry Disability Index category descriptors. Available from: FindJewelers.cz  Minimally Clinically Important Difference (MCID) = 12.8%  MUSCLE LENGTH: Hamstrings: Good Thomas test: No tightness with PKB  POSTURE: No Significant postural limitations  PALPATION: deferred  LUMBAR ROM:   AROM eval  Flexion 90%  Extension 90%  Right lateral flexion 75%P!  Left lateral flexion 75%  Right rotation   Left rotation    (Blank rows = not tested)  LOWER EXTREMITY ROM:     Active  Right eval Left eval  Hip flexion    Hip extension    Hip abduction    Hip adduction    Hip internal rotation    Hip external rotation    Knee flexion    Knee extension    Ankle dorsiflexion    Ankle plantarflexion    Ankle inversion    Ankle eversion     (Blank rows = not tested)  LOWER EXTREMITY MMT:    MMT Right eval Left eval  Hip flexion    Hip extension    Hip abduction    Hip adduction    Hip internal rotation    Hip external rotation    Knee flexion    Knee extension    Ankle dorsiflexion    Ankle plantarflexion    Ankle inversion    Ankle eversion     (Blank rows = not tested)  LUMBAR SPECIAL TESTS:  Straight leg raise test: Negative, Slump test: Pain on R, and FABER test: restricted R  FUNCTIONAL TESTS:  30 seconds chair stand test  GAIT: Distance walked:  93ftx2 Assistive device utilized: None Level of assistance: Complete Independence  TREATMENT:      [facility] Adult PT Treatment:                                                DATE: 07/23/23 Eval and HEP Self Care: Additional minutes spent for educating on updated Therapeutic Home Exercise Program as well as comparing current status to condition at start of symptoms. This included exercises focusing on stretching, strengthening, with focus on eccentric aspects. Long term goals include an improvement in range of motion, strength, endurance as well as avoiding reinjury. Patient's frequency would include in 1-2 times a day, 3-5 times a week for a duration of 6-12 weeks. Proper technique shown and discussed handout in great detail. All questions were discussed and addressed.                                                                                                                           PATIENT EDUCATION:  Education details: Discussed eval findings, rehab rationale and POC and patient is in agreement  Person educated: Patient Education method: Explanation and Handouts Education comprehension: verbalized understanding and needs further education  HOME EXERCISE PROGRAM: Access Code: [email or web address]  URL: [email or web address] Date: 07/23/2023 Prepared by: Reyes Kohut  Exercises - Supine 90/90 Abdominal Bracing  - 2 x daily - 5 x weekly - 1 sets - 2 reps - 60s hold - Sit to Stand with Arms Crossed  - 2 x daily - 5 x weekly - 1 sets - 5 reps - Seated Abdominal Press into Swiss Ball  - 2 x daily - 5 x weekly - 1 sets - 10 reps - 3s hold  ASSESSMENT:  CLINICAL IMPRESSION: Patient is a 48 y.o. female who was seen today for physical therapy evaluation and treatment for chronic low back pain. No neuro tension signs elicited but core and LE weakness noted.  Trunk and LE ROM WFL.  Patient would benefit from a HEP to improve core strength and function.  OBJECTIVE IMPAIRMENTS: decreased  activity tolerance, decreased endurance, decreased knowledge of condition, decreased mobility, increased fascial restrictions, impaired perceived functional ability, increased muscle spasms, improper body mechanics, postural dysfunction, obesity, and pain.   ACTIVITY LIMITATIONS: carrying, lifting, bending, standing, and bed mobility  PERSONAL FACTORS: Age, Fitness, Past/current experiences, and Time since onset of injury/illness/exacerbation are also affecting patient's functional outcome.   REHAB POTENTIAL: Fair based on chronicity  CLINICAL DECISION MAKING: Stable/uncomplicated  EVALUATION COMPLEXITY: Moderate   GOALS: Goals reviewed with patient? No    RT TERM GOALS=LONG TERM GOALS: Target date: 09/21/23  Patient will increase 30s chair stand reps from 7 to 9 without arms to demonstrate and improved functional ability with less pain/difficulty as well as reduce fall risk.  Baseline: 7 Goal status: INITIAL  2.  Patient will acknowledge 6/10 pain at least once during episode of care   Baseline: 9/10 Goal status: INITIAL  3.  Patient will score at least 8/50 on ODI to signify clinically meaningful improvement in functional abilities.   Baseline: 12/50 Goal status: INITIAL  4.  Patient to demonstrate independence in HEP  Baseline: [id number] Goal status: INITIAL    PLAN:  PT FREQUENCY: 1x/week  PT DURATION: 4 weeks  PLANNED INTERVENTIONS: 97110-Therapeutic exercises, 97530- Therapeutic activity, [id number]- Neuromuscular re-education, 97535- Self Care, [id number]- Manual therapy, and Patient/Family education.  PLAN FOR NEXT SESSION: HEP review and update, manual techniques as appropriate, aerobic tasks, ROM and flexibility activities, strengthening and PREs, TPDN, gait and balance training as needed    For all possible CPT codes, reference the Planned Interventions line above.     Check all conditions that are expected to impact treatment: {Conditions expected to impact  treatment:Morbid obesity   If treatment provided at initial evaluation, no treatment charged due to lack of authorization.        Charity Tessier M Orli Degrave, PT 07/23/2023, 1:28 PM

## 2023-08-03 NOTE — Therapy (Signed)
 OUTPATIENT PHYSICAL THERAPY THORACOLUMBAR EVALUATION   Patient Name: Katherine [name] MRN: [id number] DOB:1975/04/03, 48 y.o., female Today's Date: 08/06/2023  END OF SESSION:  PT End of Session - 08/06/23 1305     Visit Number 2    Number of Visits 4    Date for PT Re-Evaluation 09/22/23    Authorization Type CAFA    Authorization Time Period 100% covered from 05/11/23-11/11/23    PT Start Time 1305    PT Stop Time 1345    PT Time Calculation (min) 40 min    Activity Tolerance Patient tolerated treatment well    Behavior During Therapy WFL for tasks assessed/performed           Past Medical History:  Diagnosis Date   Abdominal pain    Anemia    Depression    postpartum depression   Morbid obesity (HCC)    Splenomegaly    Past Surgical History:  Procedure Laterality Date   nexplanon     ROBOTIC ASSISTED LAPAROSCOPIC VENTRAL/INCISIONAL HERNIA REPAIR N/A 01/04/2018   Procedure: ROBOTIC ASSISTED LAPAROSCOPIC VENTRAL/INCISIONAL HERNIA REPAIR;  Surgeon: Jordis Laneta FALCON, MD;  Location: [facility];  Service: General;  Laterality: N/A;   ROBOTIC ASSISTED SALPINGO OOPHERECTOMY Bilateral 01/04/2018   Procedure: ROBOTIC ASSISTED SALPINGECTOMY;  Surgeon: Lake Read, MD;  Location: [facility];  Service: Gynecology;  Laterality: Bilateral;   TUBAL LIGATION     UPPER GASTROINTESTINAL ENDOSCOPY     UPPER GI ENDOSCOPY  2015   Patient Active Problem List   Diagnosis Date Noted   Eye pain, right 05/31/2023   Headache disorder 05/31/2023   Class 2 obesity with body mass index (BMI) of 38.0 to 38.9 in adult 05/31/2023   Bilateral thoracic back pain 01/23/2023   Screening for colon cancer 01/23/2023   Muscle spasm 01/23/2023   Establishing care with new doctor, encounter for 01/23/2023   Immunization due 01/23/2023   Hypertriglyceridemia 01/23/2023   Cervical polyp 01/05/2023   Menorrhagia, premenopausal 09/29/2022   Heart failure with preserved ejection fraction (HCC)  09/29/2022   Vaginal discharge 09/29/2022   Chest pain 03/23/2022   History of COVID-19 05/03/2019   Herpes zoster without complication 05/03/2019   Chronic bilateral low back pain without sciatica 05/03/2019   Ventral hernia without obstruction or gangrene    Spleen enlarged 07/31/2013   History of anemia 07/31/2013   Dental caries 09/06/2012    PCP: Petrina Pries, NP   REFERRING PROVIDER: Addie Cordella Hamilton, MD  REFERRING DIAG: [phone] (ICD-10-CM) - Chronic right-sided low back pain with right-sided sciatica  Rationale for Evaluation and Treatment: Rehabilitation  THERAPY DIAG:  Other low back pain  Sciatica, right side  Muscle weakness (generalized)  ONSET DATE: 2+ years  SUBJECTIVE:  SUBJECTIVE STATEMENT: Back pain less.  Pain intensity 6/10 today and has been compliant with HEP.     HPI: Katherine [name] is a 48 y.o. female who presents to the office reporting right knee pain of 2 months duration and back pain of 2 years duration.  The right knee has been going on for about 2 months with no known history of injury.  Does report some weakness and giving way as well as some locking symptoms.  Does not wake her up from sleep at night.  Her back is much worse than the knee.   Patient also describes radicular pain down that right-hand side along with numbness and tingling.  Does radiate down to the foot.  Tried ibuprofen  without much relief.  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  Hurts for her to pick things up.  Using a back support brace does help.  Does hurt her cough and sneeze.  Walking hurts her more than sitting.[name]    PERTINENT HISTORY:   Plan: Impression is right knee pain with possible meniscal  pathology.  Radiographs are underwhelming in terms of any bony findings.  Overall they look good.  Plan is aspiration and injection of that right knee today.  Regarding the back she does not have any red flag symptoms today.  No fevers or chills.  No weakness.  Would like for her to do therapy for several weeks and then return in 4 weeks with decision for or against MRI scanning and possible ESI of the back at that time.  We can also recheck to see how her knee is doing.   PAIN:  Are you having pain? Yes: NPRS scale: 9/10 at worse Pain location: Low back and RLE Pain description: ache, sharp Aggravating factors: bending, lifting Relieving factors: ibuprofen  and supine  PRECAUTIONS: None  RED FLAGS: None   WEIGHT BEARING RESTRICTIONS: No  FALLS:  Has patient fallen in last 6 months? No  OCCUPATION: housecleaning  PLOF: Independent  PATIENT GOALS: To manage my back pain  NEXT MD VISIT: 4 weeks  OBJECTIVE:  Note: Objective measures were completed at Evaluation unless otherwise noted.  DIAGNOSTIC FINDINGS:  XR Lumbar Spine 2-3 Views Result Date: 07/09/2023 AP lateral radiographs lumbar spine reviewed.  Normal lordosis is present.  No acute fracture.  No spondylolisthesis.  Mild to moderate facet arthritis is present at L4-5 and L5-S1.  No significant degenerative disc disease is present between the vertebral bodies or around the other facet joints.  PATIENT SURVEYS:  Modified Oswestry: 12/50 24% perceived disability  Interpretation of scores: Score Category Description  0-20% Minimal Disability The patient can cope with most living activities. Usually no treatment is indicated apart from advice on lifting, sitting and exercise  21-40% Moderate Disability The patient experiences more pain and difficulty with sitting, lifting and standing. Travel and social life are more difficult and they may be disabled from work. Personal care, sexual activity and sleeping are not grossly  affected, and the patient can usually be managed by conservative means  41-60% Severe Disability Pain remains the main problem in this group, but activities of daily living are affected. These patients require a detailed investigation  61-80% Crippled Back pain impinges on all aspects of the patient's life. Positive intervention is required  81-100% Bed-bound  These patients are either bed-bound or exaggerating their symptoms  [name] BRAVO, Katherine DELENA Karon DELENA, et al. Surgery versus conservative management  of stable thoracolumbar fracture: the PRESTO feasibility RCT. [place] ([place]): [employer]; 2021 Nov. [facility] Technology Assessment, No. 25.62.) Appendix 3, Oswestry Disability Index category descriptors. Available from: FindJewelers.cz  Minimally Clinically Important Difference (MCID) = 12.8%  MUSCLE LENGTH: Hamstrings: Good Thomas test: No tightness with PKB  POSTURE: No Significant postural limitations  PALPATION: deferred  LUMBAR ROM:   AROM eval  Flexion 90%  Extension 90%  Right lateral flexion 75%P!  Left lateral flexion 75%  Right rotation   Left rotation    (Blank rows = not tested)  LOWER EXTREMITY ROM:     Active  Right eval Left eval  Hip flexion    Hip extension    Hip abduction    Hip adduction    Hip internal rotation    Hip external rotation    Knee flexion    Knee extension    Ankle dorsiflexion    Ankle plantarflexion    Ankle inversion    Ankle eversion     (Blank rows = not tested)  LOWER EXTREMITY MMT:    MMT Right eval Left eval  Hip flexion    Hip extension    Hip abduction    Hip adduction    Hip internal rotation    Hip external rotation    Knee flexion    Knee extension    Ankle dorsiflexion    Ankle plantarflexion    Ankle inversion    Ankle eversion     (Blank rows = not tested)  LUMBAR SPECIAL TESTS:  Straight leg raise test: Negative, Slump test: Pain on R, and FABER test:  restricted R  FUNCTIONAL TESTS:  30 seconds chair stand test 7 reps  GAIT: Distance walked: 90ftx2 Assistive device utilized: None Level of assistance: Complete Independence  TREATMENT:   [facility] Adult PT Treatment:                                                DATE: 08/06/23 Therapeutic Exercise: Nustep L4 8 min Neuromuscular re-ed: Supine hip fallouts GTB 15xB, 15/15 unilaterally Bridge against GTB 15x S/L clams GTB 15/15 Bridge with ball squeeze 15x Therapeutic Activity: Seated hamstring stretch 30s x2 B 90/90 60s x2 Supine QL stretch  30s x2 B    [facility] Adult PT Treatment:                                                DATE: 07/23/23 Eval and HEP Self Care: Additional minutes spent for educating on updated Therapeutic Home Exercise Program as well as comparing current status to condition at start of symptoms. This included exercises focusing on stretching, strengthening, with focus on eccentric aspects. Long term goals include an improvement in range of motion, strength, endurance as well as avoiding reinjury. Patient's frequency would include in 1-2 times a day, 3-5 times a week for a duration of 6-12 weeks. Proper technique shown and discussed handout in great detail. All questions were discussed and addressed.  PATIENT EDUCATION:  Education details: Discussed eval findings, rehab rationale and POC and patient is in agreement  Person educated: Patient Education method: Explanation and Handouts Education comprehension: verbalized understanding and needs further education  HOME EXERCISE PROGRAM: Access Code: [email or web address] URL: [email or web address] Date: 07/23/2023 Prepared by: Reyes Kohut  Exercises - Supine 90/90 Abdominal Bracing  - 2 x daily - 5 x weekly - 1 sets - 2 reps - 60s hold - Sit to Stand with Arms Crossed  - 2 x daily - 5 x  weekly - 1 sets - 5 reps - Seated Abdominal Press into Swiss Ball  - 2 x daily - 5 x weekly - 1 sets - 10 reps - 3s hold  ASSESSMENT:  CLINICAL IMPRESSION: patient presents for first f/u session reporting compliance with HEP and less pain.  Treatment focus was aerobic warm up leading to stretching, LE and trunk flexibility exercises.  Emphasis placed on hip strengthening and core activation.  Increased time on 90/90 hold to challenge core.     Patient is a 48 y.o. female who was seen today for physical therapy evaluation and treatment for chronic low back pain. No neuro tension signs elicited but core and LE weakness noted.  Trunk and LE ROM WFL.  Patient would benefit from a HEP to improve core strength and function.  OBJECTIVE IMPAIRMENTS: decreased activity tolerance, decreased endurance, decreased knowledge of condition, decreased mobility, increased fascial restrictions, impaired perceived functional ability, increased muscle spasms, improper body mechanics, postural dysfunction, obesity, and pain.   ACTIVITY LIMITATIONS: carrying, lifting, bending, standing, and bed mobility  PERSONAL FACTORS: Age, Fitness, Past/current experiences, and Time since onset of injury/illness/exacerbation are also affecting patient's functional outcome.   REHAB POTENTIAL: Fair based on chronicity  CLINICAL DECISION MAKING: Stable/uncomplicated  EVALUATION COMPLEXITY: Moderate   GOALS: Goals reviewed with patient? No    RT TERM GOALS=LONG TERM GOALS: Target date: 09/21/23  Patient will increase 30s chair stand reps from 7 to 9 without arms to demonstrate and improved functional ability with less pain/difficulty as well as reduce fall risk.  Baseline: 7 Goal status: INITIAL  2.  Patient will acknowledge 6/10 pain at least once during episode of care   Baseline: 9/10 Goal status: INITIAL  3.  Patient will score at least 8/50 on ODI to signify clinically meaningful improvement in functional  abilities.   Baseline: 12/50 Goal status: INITIAL  4.  Patient to demonstrate independence in HEP  Baseline: [id number] Goal status: INITIAL    PLAN:  PT FREQUENCY: 1x/week  PT DURATION: 4 weeks  PLANNED INTERVENTIONS: 97110-Therapeutic exercises, 97530- Therapeutic activity, [id number]- Neuromuscular re-education, 97535- Self Care, [id number]- Manual therapy, and Patient/Family education.  PLAN FOR NEXT SESSION: HEP review and update, manual techniques as appropriate, aerobic tasks, ROM and flexibility activities, strengthening and PREs, TPDN, gait and balance training as needed    For all possible CPT codes, reference the Planned Interventions line above.     Check all conditions that are expected to impact treatment: {Conditions expected to impact treatment:Morbid obesity   If treatment provided at initial evaluation, no treatment charged due to lack of authorization.        Deshayla Empson M Verenis Nicosia, PT 08/06/2023, 1:43 PM

## 2023-08-06 ENCOUNTER — Ambulatory Visit: Attending: Orthopedic Surgery

## 2023-08-06 DIAGNOSIS — M6281 Muscle weakness (generalized): Secondary | ICD-10-CM | POA: Insufficient documentation

## 2023-08-06 DIAGNOSIS — M5431 Sciatica, right side: Secondary | ICD-10-CM | POA: Insufficient documentation

## 2023-08-06 DIAGNOSIS — M5459 Other low back pain: Secondary | ICD-10-CM | POA: Insufficient documentation

## 2023-08-12 ENCOUNTER — Ambulatory Visit

## 2023-08-12 DIAGNOSIS — M5459 Other low back pain: Secondary | ICD-10-CM

## 2023-08-12 DIAGNOSIS — M6281 Muscle weakness (generalized): Secondary | ICD-10-CM

## 2023-08-12 DIAGNOSIS — M5431 Sciatica, right side: Secondary | ICD-10-CM

## 2023-08-12 NOTE — Therapy (Signed)
 OUTPATIENT PHYSICAL THERAPY NOTE   Patient Name: Katherine [name] MRN: [id number] DOB:12-02-1975, 48 y.o., female Today's Date: 08/12/2023  END OF SESSION:  PT End of Session - 08/12/23 1407     Visit Number 3    Number of Visits 4    Date for PT Re-Evaluation 09/22/23    Authorization Type CAFA    Authorization Time Period 100% covered from 05/11/23-11/11/23    PT Start Time 1401    PT Stop Time 1440    PT Time Calculation (min) 39 min    Activity Tolerance Patient tolerated treatment well    Behavior During Therapy WFL for tasks assessed/performed            Past Medical History:  Diagnosis Date   Abdominal pain    Anemia    Depression    postpartum depression   Morbid obesity (HCC)    Splenomegaly    Past Surgical History:  Procedure Laterality Date   nexplanon     ROBOTIC ASSISTED LAPAROSCOPIC VENTRAL/INCISIONAL HERNIA REPAIR N/A 01/04/2018   Procedure: ROBOTIC ASSISTED LAPAROSCOPIC VENTRAL/INCISIONAL HERNIA REPAIR;  Surgeon: Jordis Laneta FALCON, MD;  Location: [facility];  Service: General;  Laterality: N/A;   ROBOTIC ASSISTED SALPINGO OOPHERECTOMY Bilateral 01/04/2018   Procedure: ROBOTIC ASSISTED SALPINGECTOMY;  Surgeon: Lake Read, MD;  Location: [facility];  Service: Gynecology;  Laterality: Bilateral;   TUBAL LIGATION     UPPER GASTROINTESTINAL ENDOSCOPY     UPPER GI ENDOSCOPY  2015   Patient Active Problem List   Diagnosis Date Noted   Eye pain, right 05/31/2023   Headache disorder 05/31/2023   Class 2 obesity with body mass index (BMI) of 38.0 to 38.9 in adult 05/31/2023   Bilateral thoracic back pain 01/23/2023   Screening for colon cancer 01/23/2023   Muscle spasm 01/23/2023   Establishing care with new doctor, encounter for 01/23/2023   Immunization due 01/23/2023   Hypertriglyceridemia 01/23/2023   Cervical polyp 01/05/2023   Menorrhagia, premenopausal 09/29/2022   Heart failure with preserved ejection fraction (HCC) 09/29/2022   Vaginal  discharge 09/29/2022   Chest pain 03/23/2022   History of COVID-19 05/03/2019   Herpes zoster without complication 05/03/2019   Chronic bilateral low back pain without sciatica 05/03/2019   Ventral hernia without obstruction or gangrene    Spleen enlarged 07/31/2013   History of anemia 07/31/2013   Dental caries 09/06/2012    PCP: Petrina Pries, NP   REFERRING PROVIDER: Addie Cordella Hamilton, MD  REFERRING DIAG: [phone] (ICD-10-CM) - Chronic right-sided low back pain with right-sided sciatica  Rationale for Evaluation and Treatment: Rehabilitation  THERAPY DIAG:  Other low back pain  Sciatica, right side  Muscle weakness (generalized)  ONSET DATE: 2+ years  SUBJECTIVE:  SUBJECTIVE STATEMENT: Patient reports 6-7/10 today. She feels that she has noticed some improvement since starting PT. She is complaint   HPI: Katherine [name] is a 48 y.o. female who presents to the office reporting right knee pain of 2 months duration and back pain of 2 years duration.  The right knee has been going on for about 2 months with no known history of injury.  Does report some weakness and giving way as well as some locking symptoms.  Does not wake her up from sleep at night.  Her back is much worse than the knee.   Patient also describes radicular pain down that right-hand side along with numbness and tingling.  Does radiate down to the foot.  Tried ibuprofen  without much relief.  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  Hurts for her to pick things up.  Using a back support brace does help.  Does hurt her cough and sneeze.  Walking hurts her more than sitting.[name]    PERTINENT HISTORY:   Plan: Impression is right knee pain with  possible meniscal pathology.  Radiographs are underwhelming in terms of any bony findings.  Overall they look good.  Plan is aspiration and injection of that right knee today.  Regarding the back she does not have any red flag symptoms today.  No fevers or chills.  No weakness.  Would like for her to do therapy for several weeks and then return in 4 weeks with decision for or against MRI scanning and possible ESI of the back at that time.  We can also recheck to see how her knee is doing.   PAIN:  Are you having pain? Yes: NPRS scale: 9/10 at worse Pain location: Low back and RLE Pain description: ache, sharp Aggravating factors: bending, lifting Relieving factors: ibuprofen  and supine  PRECAUTIONS: None  RED FLAGS: None   WEIGHT BEARING RESTRICTIONS: No  FALLS:  Has patient fallen in last 6 months? No  OCCUPATION: housecleaning  PLOF: Independent  PATIENT GOALS: To manage my back pain  NEXT MD VISIT: 4 weeks  OBJECTIVE:  Note: Objective measures were completed at Evaluation unless otherwise noted.  DIAGNOSTIC FINDINGS:  XR Lumbar Spine 2-3 Views Result Date: 07/09/2023 AP lateral radiographs lumbar spine reviewed.  Normal lordosis is present.  No acute fracture.  No spondylolisthesis.  Mild to moderate facet arthritis is present at L4-5 and L5-S1.  No significant degenerative disc disease is present between the vertebral bodies or around the other facet joints.  PATIENT SURVEYS:  Modified Oswestry: 12/50 24% perceived disability  Interpretation of scores: Score Category Description  0-20% Minimal Disability The patient can cope with most living activities. Usually no treatment is indicated apart from advice on lifting, sitting and exercise  21-40% Moderate Disability The patient experiences more pain and difficulty with sitting, lifting and standing. Travel and social life are more difficult and they may be disabled from work. Personal care, sexual activity and sleeping are  not grossly affected, and the patient can usually be managed by conservative means  41-60% Severe Disability Pain remains the main problem in this group, but activities of daily living are affected. These patients require a detailed investigation  61-80% Crippled Back pain impinges on all aspects of the patient's life. Positive intervention is required  81-100% Bed-bound  These patients are either bed-bound or exaggerating their symptoms  [name] BRAVO, Zoe DELENA Karon DELENA, et al. Surgery versus  conservative management of stable thoracolumbar fracture: the PRESTO feasibility RCT. [place]): [employer]; 2021 Nov. [employer] Assessment, No. 25.62.) Appendix 3, Oswestry Disability Index category descriptors. Available from: FindJewel[employer].cz  Minimally Clinically Important Difference (MCID) = 12.8%  MUSCLE LENGTH: Hamstrings: Good Thomas test: No tightness with PKB  POSTURE: No Significant postural limitations  PALPATION: deferred  LUMBAR ROM:   AROM eval  Flexion 90%  Extension 90%  Right lateral flexion 75%P!  Left lateral flexion 75%  Right rotation   Left rotation    (Blank rows = not tested)  LOWER EXTREMITY ROM:     Active  Right eval Left eval  Hip flexion    Hip extension    Hip abduction    Hip adduction    Hip internal rotation    Hip external rotation    Knee flexion    Knee extension    Ankle dorsiflexion    Ankle plantarflexion    Ankle inversion    Ankle eversion     (Blank rows = not tested)  LOWER EXTREMITY MMT:    MMT Right eval Left eval  Hip flexion    Hip extension    Hip abduction    Hip adduction    Hip internal rotation    Hip external rotation    Knee flexion    Knee extension    Ankle dorsiflexion    Ankle plantarflexion    Ankle inversion    Ankle eversion     (Blank rows = not tested)  LUMBAR SPECIAL TESTS:  Straight leg raise test: Negative, Slump test: Pain on R, and FABER  test: restricted R  FUNCTIONAL TESTS:  30 seconds chair stand test 7 reps  GAIT: Distance walked: 43ftx2 Assistive device utilized: None Level of assistance: Complete Independence  TREATMENT:    [facility] Adult PT Treatment:                                                DATE: 08/12/2023   Therapeutic Exercise: Nustep L4 8 min 90/90 60s x2 Seated hamstring stretch 30s x2 B Supine QL stretch  30s x2 B SLR x15 BIL   Neuromuscular re-ed: Supine hip fallouts GTB 15/15 unilaterally Bridge against GTB 15x S/L clams GTB 15/15 Bridge with ball squeeze 2x15   [facility] Adult PT Treatment:                                                DATE: 08/06/23 Therapeutic Exercise: Nustep L4 8 min Neuromuscular re-ed: Supine hip fallouts GTB 15xB, 15/15 unilaterally Bridge against GTB 15x S/L clams GTB 15/15 Bridge with ball squeeze 15x Therapeutic Activity: Seated hamstring stretch 30s x2 B 90/90 60s x2 Supine QL stretch  30s x2 B    [facility] Adult PT Treatment:                                                DATE: 07/23/23 Eval and HEP Self Care: Additional minutes spent for educating on updated Therapeutic Home Exercise Program as well as comparing current status to condition at start of  symptoms. This included exercises focusing on stretching, strengthening, with focus on eccentric aspects. Long term goals include an improvement in range of motion, strength, endurance as well as avoiding reinjury. Patient's frequency would include in 1-2 times a day, 3-5 times a week for a duration of 6-12 weeks. Proper technique shown and discussed handout in great detail. All questions were discussed and addressed.                                                                                                                           PATIENT EDUCATION:  Education details: Discussed eval findings, rehab rationale and POC and patient is in agreement  Person educated: Patient Education method: Explanation and  Handouts Education comprehension: verbalized understanding and needs further education  HOME EXERCISE PROGRAM: Access Code: [email or web address] URL: [email or web address] Date: 07/23/2023 Prepared by: Reyes Kohut  Exercises - Supine 90/90 Abdominal Bracing  - 2 x daily - 5 x weekly - 1 sets - 2 reps - 60s hold - Sit to Stand with Arms Crossed  - 2 x daily - 5 x weekly - 1 sets - 5 reps - Seated Abdominal Press into [employer]  - 2 x daily - 5 x weekly - 1 sets - 10 reps - 3s hold  ASSESSMENT:  CLINICAL IMPRESSION:    08/12/2023 Lucienne had good tolerance of today's treatment session, which focused on gradual progression of core and LQ strengthening program. Patient had  We will continue to progress per POC as tolerated, in order to reach established rehab goals.      EVAL: Patient is a 48 y.o. female who was seen today for physical therapy evaluation and treatment for chronic low back pain. No neuro tension signs elicited but core and LE weakness noted. Trunk and LE ROM WFL.  Patient would benefit from a HEP to improve core strength and function.  OBJECTIVE IMPAIRMENTS: decreased activity tolerance, decreased endurance, decreased knowledge of condition, decreased mobility, increased fascial restrictions, impaired perceived functional ability, increased muscle spasms, improper body mechanics, postural dysfunction, obesity, and pain.   ACTIVITY LIMITATIONS: carrying, lifting, bending, standing, and bed mobility  PERSONAL FACTORS: Age, Fitness, Past/current experiences, and Time since onset of injury/illness/exacerbation are also affecting patient's functional outcome.   REHAB POTENTIAL: Fair based on chronicity  CLINICAL DECISION MAKING: Stable/uncomplicated  EVALUATION COMPLEXITY: Moderate   GOALS: Goals reviewed with patient? No    RT TERM GOALS=LONG TERM GOALS: Target date: 09/21/23  Patient will increase 30s chair stand reps from 7 to 9 without arms to demonstrate  and improved functional ability with less pain/difficulty as well as reduce fall risk.  Baseline: 7 Goal status: INITIAL  2.  Patient will acknowledge 6/10 pain at least once during episode of care   Baseline: 9/10 Goal status: INITIAL  3.  Patient will score at least 8/50 on ODI to signify clinically meaningful improvement in functional abilities.   Baseline: 12/50 Goal status: INITIAL  4.  Patient to demonstrate independence  in HEP  Baseline: [email or web address] Goal status: INITIAL    PLAN:  PT FREQUENCY: 1x/week  PT DURATION: 4 weeks  PLANNED INTERVENTIONS: 97110-Therapeutic exercises, 97530- Therapeutic activity, [id number]- Neuromuscular re-education, 97535- Self Care, [id number]- Manual therapy, and Patient/Family education.  PLAN FOR NEXT SESSION: HEP review and update, manual techniques as appropriate, aerobic tasks, ROM and flexibility activities, strengthening and PREs, TPDN, gait and balance training as needed    For all possible CPT codes, reference the Planned Interventions line above.     Check all conditions that are expected to impact treatment: {Conditions expected to impact treatment:Morbid obesity   If treatment provided at initial evaluation, no treatment charged due to lack of authorization.       Marko Molt, PT, DPT  08/12/2023 2:40 PM

## 2023-08-13 ENCOUNTER — Ambulatory Visit: Admitting: Orthopedic Surgery

## 2023-08-19 ENCOUNTER — Ambulatory Visit: Admitting: Family Medicine

## 2023-08-19 ENCOUNTER — Encounter: Payer: Self-pay | Admitting: Family Medicine

## 2023-08-19 VITALS — BP 110/70 | HR 60 | Temp 98.9°F | Ht 63.0 in | Wt 222.0 lb

## 2023-08-19 DIAGNOSIS — I5032 Chronic diastolic (congestive) heart failure: Secondary | ICD-10-CM

## 2023-08-19 DIAGNOSIS — Z6839 Body mass index (BMI) 39.0-39.9, adult: Secondary | ICD-10-CM

## 2023-08-19 DIAGNOSIS — E66812 Obesity, class 2: Secondary | ICD-10-CM

## 2023-08-19 DIAGNOSIS — Z833 Family history of diabetes mellitus: Secondary | ICD-10-CM

## 2023-08-19 DIAGNOSIS — Z23 Encounter for immunization: Secondary | ICD-10-CM

## 2023-08-19 DIAGNOSIS — Z0000 Encounter for general adult medical examination without abnormal findings: Secondary | ICD-10-CM

## 2023-08-19 DIAGNOSIS — Z1231 Encounter for screening mammogram for malignant neoplasm of breast: Secondary | ICD-10-CM

## 2023-08-19 DIAGNOSIS — E781 Pure hyperglyceridemia: Secondary | ICD-10-CM

## 2023-08-19 DIAGNOSIS — Z1159 Encounter for screening for other viral diseases: Secondary | ICD-10-CM

## 2023-08-19 NOTE — Patient Instructions (Signed)

## 2023-08-19 NOTE — Progress Notes (Signed)
 I,Katherine [name], CMA,acting as a Neurosurgeon for [employer], NP.,have documented all relevant documentation on the behalf of Katherine Creighton, NP,as directed by  Katherine Creighton, NP while in the presence of Katherine Creighton, NP.  Subjective:    Patient ID: Katherine [name] , female    DOB: 1975-12-08 , 48 y.o.   MRN: [id number]  Chief Complaint  Patient presents with   Annual Exam    Patient presents today for a physical. Patient reports she does go to a GYN but does not remember the name of the place. Patient doesn't have any questions or concerns at this time.    HPI Discussed the use of AI scribe software for clinical note transcription with the patient, who gave verbal consent to proceed.  History of Present Illness     Katherine [name] is a 48 year old female who presents for an annual physical exam.Today, there is a spanish interpreter from [facility].  She recently experienced a subconjunctival hemorrhage in her right eye, characterized by redness upon waking. She was informed that four veins had ruptured, but the condition resolved spontaneously without intervention.  She has a history of congestive heart failure, diagnosed during a four-day hospital admission in June 2024. In April 2024, she underwent heart monitoring due to chest pain. She is uncertain about the specifics of her diagnosis as there was no interpreter available during her hospital stay, and her daughter, who accompanied her, did not provide a clear explanation.  She has a family history of diabetes, with her father being affected. She is not currently on any medications and does not require refills. She previously used pain medication but has reduced its usage due to the benefits of physical therapy.      Past Medical History:  Diagnosis [name]   Abdominal pain    Anemia    Depression    postpartum depression   Morbid obesity (HCC)    Splenomegaly      Family History  Problem Relation Age of Onset   Diabetes  Mother    Kidney disease Brother    Colon cancer Neg Hx    Rectal cancer Neg Hx    Stomach cancer Neg Hx      Current Outpatient Medications:    ibuprofen  (ADVIL ) 600 MG tablet, Take 1 tablet (600 mg total) by mouth every 6 (six) hours as needed., Disp: 30 tablet, Rfl: 0   Iron , Ferrous Sulfate , 325 (65 Fe) MG TABS, Take 1 tablet by mouth daily., Disp: 90 tablet, Rfl: 1   Rimegepant Sulfate  (NURTEC) 75 MG TBDP, Take 1 tablet (75 mg total) by mouth as needed., Disp: 1 tablet, Rfl: 0   No Known Allergies    Social History   Tobacco Use  Smoking Status Never   Passive exposure: Never  Smokeless Tobacco Never  . Social History   Substance and Sexual Activity  Alcohol Use Yes   Comment: occ     Review of Systems  Constitutional: Negative.   HENT: Negative.    Eyes: Negative.  Negative for pain, discharge, redness and itching.  Respiratory: Negative.    Cardiovascular: Negative.  Negative for chest pain and leg swelling.  Gastrointestinal: Negative.   Endocrine: Negative.  Negative for polydipsia, polyphagia and polyuria.  Genitourinary: Negative.   Musculoskeletal: Negative.   Skin: Negative.   Neurological: Negative.   Hematological: Negative.   Psychiatric/Behavioral: Negative.       Today's Vitals   08/19/23 0921  BP: 110/70  Pulse: 60  Temp:  98.9 F (37.2 C)  TempSrc: Oral  Weight: 222 lb (100.7 kg)  Height: 5' 3 (1.6 m)  PainSc: 0-No pain   Body mass index is 39.33 kg/m.  Wt Readings from Last 3 Encounters:  08/19/23 222 lb (100.7 kg)  05/20/23 220 lb (99.8 kg)  05/06/23 224 lb (101.6 kg)     Objective:  Physical Exam Constitutional:      Appearance: Normal appearance.  HENT:     Head: Normocephalic.  Cardiovascular:     Rate and Rhythm: Normal rate and regular rhythm.     Pulses: Normal pulses.     Heart sounds: Normal heart sounds.  Pulmonary:     Effort: Pulmonary effort is normal.     Breath sounds: Normal breath sounds.  Abdominal:      General: Bowel sounds are normal.  Musculoskeletal:        General: Normal range of motion.  Skin:    General: Skin is warm and dry.  Neurological:     General: No focal deficit present.     Mental Status: She is alert and oriented to person, place, and time. Mental status is at baseline.  Psychiatric:        Mood and Affect: Mood normal.         Assessment And Plan:     Encounter for general adult medical examination w/o abnormal findings  Hypertriglyceridemia -     Lipid panel  Chronic heart failure with preserved ejection fraction (HCC) Assessment & Plan: - Advise weight loss. - Follow up with cardiologist in one year for further testing.  Orders: -     CBC -     CMP14+EGFR  Encounter for hepatitis C screening test for low risk patient -     Hepatitis C antibody  Immunization due -     Pneumococcal conjugate vaccine 20-valent  Family history of diabetes mellitus in father -     Hemoglobin A1c  Screening mammogram for breast cancer -     3D Screening Mammogram, Left and Right; Future  Class 2 severe obesity due to excess calories with serious comorbidity and body mass index (BMI) of 39.0 to 39.9 in adult [facility])     Assessment & Plan Congestive Heart Failure Diagnosed in June 2024 with ineffective cardiac pumping.   General Health Maintenance Routine physical examination. Blood work planned for cholesterol, kidney function, liver function, and A1c due to family history of diabetes. Discussed self-breast exam techniques and importance of regular screening. Up to [name] with Pap smear as of December 2024. - Perform blood work to check cholesterol, kidney function, liver function, and A1c. - Educate on monthly self-breast exams. - Administer vaccine as planned.   Return for 1 year physical. Patient was given opportunity to ask questions. Patient verbalized understanding of the plan and was able to repeat key elements of the plan. All questions were answered  to their satisfaction.   I, Katherine Creighton, NP, have reviewed all documentation for this visit. The documentation on 08/30/2023 for the exam, diagnosis, procedures, and orders are all accurate and complete.

## 2023-08-20 ENCOUNTER — Ambulatory Visit

## 2023-08-20 DIAGNOSIS — M5431 Sciatica, right side: Secondary | ICD-10-CM

## 2023-08-20 DIAGNOSIS — M6281 Muscle weakness (generalized): Secondary | ICD-10-CM

## 2023-08-20 DIAGNOSIS — M5459 Other low back pain: Secondary | ICD-10-CM

## 2023-08-20 LAB — LIPID PANEL
Chol/HDL Ratio: 2.8 ratio (ref 0.0–4.4)
Cholesterol, Total: 147 mg/dL (ref 100–199)
HDL: 53 mg/dL (ref >39)
LDL Chol Calc (NIH): 73 mg/dL (ref 0–99)
Triglycerides: 120 mg/dL (ref 0–149)
VLDL Cholesterol Cal: 21 mg/dL (ref 5–40)

## 2023-08-20 LAB — CMP14+EGFR
ALT: 15 IU/L (ref 0–32)
AST: 17 IU/L (ref 0–40)
Albumin: 3.9 g/dL (ref 3.9–4.9)
Alkaline Phosphatase: 107 IU/L (ref 44–121)
BUN/Creatinine Ratio: 19 (ref 9–23)
BUN: 12 mg/dL (ref 6–24)
Bilirubin Total: 0.2 mg/dL (ref 0.0–1.2)
CO2: 19 mmol/L — ABNORMAL LOW (ref 20–29)
Calcium: 8.7 mg/dL (ref 8.7–10.2)
Chloride: 103 mmol/L (ref 96–106)
Creatinine, Ser: 0.64 mg/dL (ref 0.57–1.00)
Globulin, Total: 2.6 g/dL (ref 1.5–4.5)
Glucose: 91 mg/dL (ref 70–99)
Potassium: 4.1 mmol/L (ref 3.5–5.2)
Sodium: 137 mmol/L (ref 134–144)
Total Protein: 6.5 g/dL (ref 6.0–8.5)
eGFR: 110 mL/min/1.73 (ref >59)

## 2023-08-20 LAB — CBC
Hematocrit: 33.7 % — ABNORMAL LOW (ref 34.0–46.6)
Hemoglobin: 11.5 g/dL (ref 11.1–15.9)
MCH: 28.8 pg (ref 26.6–33.0)
MCHC: 34.1 g/dL (ref 31.5–35.7)
MCV: 85 fL (ref 79–97)
Platelets: 294 x10E3/uL (ref 150–450)
RBC: 3.99 x10E6/uL (ref 3.77–5.28)
RDW: 13.5 % (ref 11.7–15.4)
WBC: 10.2 x10E3/uL (ref 3.4–10.8)

## 2023-08-20 LAB — HEMOGLOBIN A1C
Est. average glucose Bld gHb Est-mCnc: 111 mg/dL
Hgb A1c MFr Bld: 5.5 % (ref 4.8–5.6)

## 2023-08-20 LAB — HEPATITIS C ANTIBODY: Hep C Virus Ab: NONREACTIVE

## 2023-08-20 NOTE — Therapy (Addendum)
 OUTPATIENT PHYSICAL THERAPY NOTE   Patient Name: Katherine [name] MRN: [id number] DOB:1975-12-03, 48 y.o., female Today's Date: 08/21/2023  END OF SESSION:  PT End of Session - 08/20/23 1356     Visit Number 4    Number of Visits 8    Date for PT Re-Evaluation 09/22/23    Authorization Type CAFA    Authorization Time Period 100% covered from 05/11/23-11/11/23    PT Start Time 1355    PT Stop Time 1430    PT Time Calculation (min) 35 min    Activity Tolerance Patient tolerated treatment well    Behavior During Therapy WFL for tasks assessed/performed             Past Medical History:  Diagnosis Date   Abdominal pain    Anemia    Depression    postpartum depression   Morbid obesity (HCC)    Splenomegaly    Past Surgical History:  Procedure Laterality Date   nexplanon     ROBOTIC ASSISTED LAPAROSCOPIC VENTRAL/INCISIONAL HERNIA REPAIR N/A 01/04/2018   Procedure: ROBOTIC ASSISTED LAPAROSCOPIC VENTRAL/INCISIONAL HERNIA REPAIR;  Surgeon: Katherine Laneta FALCON, MD;  Location: [facility] OR[facility];  Service: General;  Laterality: N/A;   ROBOTIC ASSISTED SALPINGO OOPHERECTOMY Bilateral 01/04/2018   Procedure: ROBOTIC ASSISTED SALPINGECTOMY;  Surgeon: Katherine Read, MD;  Location: [facility];  Service: Gynecology;  Laterality: Bilateral;   TUBAL LIGATION     UPPER GASTROINTESTINAL ENDOSCOPY     UPPER GI ENDOSCOPY  2015   Patient Active Problem List   Diagnosis Date Noted   Class 2 severe obesity due to excess calories with serious comorbidity and body mass index (BMI) of 39.0 to 39.9 in adult [facility]) 08/19/2023   Screening mammogram for breast cancer 08/19/2023   Family history of diabetes mellitus in father 08/19/2023   Eye pain, right 05/31/2023   Headache disorder 05/31/2023   Class 2 obesity with body mass index (BMI) of 38.0 to 38.9 in adult 05/31/2023   Bilateral thoracic back pain 01/23/2023   Screening for colon cancer 01/23/2023   Muscle spasm 01/23/2023   Establishing care  with new doctor, encounter for 01/23/2023   Immunization due 01/23/2023   Hypertriglyceridemia 01/23/2023   Cervical polyp 01/05/2023   Menorrhagia, premenopausal 09/29/2022   Heart failure with preserved ejection fraction (HCC) 09/29/2022   Vaginal discharge 09/29/2022   Chest pain 03/23/2022   History of COVID-19 05/03/2019   Herpes zoster without complication 05/03/2019   Chronic bilateral low back pain without sciatica 05/03/2019   Ventral hernia without obstruction or gangrene    Spleen enlarged 07/31/2013   History of anemia 07/31/2013   Dental caries 09/06/2012    PCP: Katherine Pries, NP   REFERRING PROVIDER: Addie Cordella Hamilton, MD  REFERRING DIAG: [phone] (ICD-10-CM) - Chronic right-sided low back pain with right-sided sciatica  Rationale for Evaluation and Treatment: Rehabilitation  THERAPY DIAG:  Other low back pain  Sciatica, right side  Muscle weakness (generalized)  ONSET DATE: 2+ years  SUBJECTIVE:  SUBJECTIVE STATEMENT: Patient reports 6-7/10 today. She feels that she has noticed some improvement since starting PT. She is complaint with HEP and would like to continue with PT to improve tolerance of household activities including pain with lifting.   HPI: Katherine [name] is a 48 y.o. female who presents to the office reporting right knee pain of 2 months duration and back pain of 2 years duration.  The right knee has been going on for about 2 months with no known history of injury.  Does report some weakness and giving way as well as some locking symptoms.  Does not wake her up from sleep at night.  Her back is much worse than the knee.   Patient also describes radicular pain down that right-hand side along with numbness and tingling.  Does radiate down to the foot.   Tried ibuprofen  without much relief.  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  Hurts for her to pick things up.  Using a back support brace does help.  Does hurt her cough and sneeze.  Walking hurts her more than sitting.Katherine [name]    PERTINENT HISTORY:   Plan: Impression is right knee pain with possible meniscal pathology.  Radiographs are underwhelming in terms of any bony findings.  Overall they look good.  Plan is aspiration and injection of that right knee today.  Regarding the back she does not have any red flag symptoms today.  No fevers or chills.  No weakness.  Would like for her to do therapy for several weeks and then return in 4 weeks with decision for or against MRI scanning and possible ESI of the back at that time.  We can also recheck to see how her knee is doing.   PAIN:  Are you having pain? Yes: NPRS scale: 9/10 at worse Pain location: Low back and RLE Pain description: ache, sharp Aggravating factors: bending, lifting Relieving factors: ibuprofen  and supine  PRECAUTIONS: None  RED FLAGS: None   WEIGHT BEARING RESTRICTIONS: No  FALLS:  Has patient fallen in last 6 months? No  OCCUPATION: housecleaning  PLOF: Independent  PATIENT GOALS: To manage my back pain  NEXT MD VISIT: 4 weeks  OBJECTIVE:  Note: Objective measures were completed at Evaluation unless otherwise noted.  DIAGNOSTIC FINDINGS:  XR Lumbar Spine 2-3 Views Result Date: 07/09/2023 AP lateral radiographs lumbar spine reviewed.  Normal lordosis is present.  No acute fracture.  No spondylolisthesis.  Mild to moderate facet arthritis is present at L4-5 and L5-S1.  No significant degenerative disc disease is present between the vertebral bodies or around the other facet joints.  PATIENT SURVEYS:  Modified Oswestry: 12/50 24% perceived disability  Interpretation of scores: Score Category Description  0-20% Minimal Disability The  patient can cope with most living activities. Usually no treatment is indicated apart from advice on lifting, sitting and exercise  21-40% Moderate Disability The patient experiences more pain and difficulty with sitting, lifting and standing. Travel and social life are more difficult and they may be disabled from work. Personal care, sexual activity and sleeping are not grossly affected, and the patient can usually be managed by conservative means  41-60% Severe Disability Pain remains the main problem in this group, but activities of daily living are affected. These patients require a detailed investigation  61-80% Crippled Back pain impinges on all aspects of the patient's life. Positive intervention is required  81-100% Bed-bound  These  patients are either bed-bound or exaggerating their symptoms  [name] FORBES Zoe DELENA Karon DELENA, et al. Surgery versus conservative management of stable thoracolumbar fracture: the PRESTO feasibility RCT. [place]): [employer]; 2021 Nov. [facility] Technology Assessment, No. 25.62.) Appendix 3, Oswestry Disability Index category descriptors. Available from: FindJewel[employer].cz  Minimally Clinically Important Difference (MCID) = 12.8%  MUSCLE LENGTH: Hamstrings: Good Thomas test: No tightness with PKB  POSTURE: No Significant postural limitations  PALPATION: deferred  LUMBAR ROM:   AROM eval  Flexion 90%  Extension 90%  Right lateral flexion 75%P!  Left lateral flexion 75%  Right rotation   Left rotation    (Blank rows = not tested)  LOWER EXTREMITY ROM:     Active  Right eval Left eval  Hip flexion    Hip extension    Hip abduction    Hip adduction    Hip internal rotation    Hip external rotation    Knee flexion    Knee extension    Ankle dorsiflexion    Ankle plantarflexion    Ankle inversion    Ankle eversion     (Blank rows = not tested)  LOWER EXTREMITY MMT:    MMT Right eval Left eval   Hip flexion    Hip extension    Hip abduction    Hip adduction    Hip internal rotation    Hip external rotation    Knee flexion    Knee extension    Ankle dorsiflexion    Ankle plantarflexion    Ankle inversion    Ankle eversion     (Blank rows = not tested)  LUMBAR SPECIAL TESTS:  Straight leg raise test: Negative, Slump test: Pain on R, and FABER test: restricted R  FUNCTIONAL TESTS:  30 seconds chair stand test 7 reps  GAIT: Distance walked: 8ftx2 Assistive device utilized: None Level of assistance: Complete Independence  TREATMENT:    [facility] Adult PT Treatment:                                                DATE: 08/20/2023  Therapeutic Activity:  Nustep L3 8 min Reassessment of objective measures and subjective assessment regarding progress towards established goals and updated plan for addressing remaining deficits and rehab goals.    [facility] Adult PT Treatment:                                                DATE: 08/12/2023   Therapeutic Exercise: Nustep L4 8 min 90/90 60s x2 Seated hamstring stretch 30s x2 B Supine QL stretch  30s x2 B SLR x15 BIL   Neuromuscular re-ed: Supine hip fallouts GTB 15/15 unilaterally Bridge against GTB 15x S/L clams GTB 15/15 Bridge with ball squeeze 2x15   [facility] Adult PT Treatment:                                                DATE: 08/06/23 Therapeutic Exercise: Nustep L4 8 min Neuromuscular re-ed: Supine hip fallouts GTB 15xB, 15/15 unilaterally Bridge against GTB 15x S/L clams GTB 15/15 Bridge  with ball squeeze 15x Therapeutic Activity: Seated hamstring stretch 30s x2 B 90/90 60s x2 Supine QL stretch  30s x2 B    [facility] Adult PT Treatment:                                                DATE: 07/23/23 Eval and HEP Self Care: Additional minutes spent for educating on updated Therapeutic Home Exercise Program as well as comparing current status to condition at start of symptoms. This included exercises focusing on  stretching, strengthening, with focus on eccentric aspects. Long term goals include an improvement in range of motion, strength, endurance as well as avoiding reinjury. Patient's frequency would include in 1-2 times a day, 3-5 times a week for a duration of 6-12 weeks. Proper technique shown and discussed handout in great detail. All questions were discussed and addressed.                                                                                                                           PATIENT EDUCATION:  Education details: Discussed eval findings, rehab rationale and POC and patient is in agreement  Person educated: Patient Education method: Explanation and Handouts Education comprehension: verbalized understanding and needs further education  HOME EXERCISE PROGRAM: Access Code: [email or web address] URL: [email or web address] Date: 07/23/2023 Prepared by: Reyes Kohut  Exercises - Supine 90/90 Abdominal Bracing  - 2 x daily - 5 x weekly - 1 sets - 2 reps - 60s hold - Sit to Stand with Arms Crossed  - 2 x daily - 5 x weekly - 1 sets - 5 reps - Seated Abdominal Press into Swiss Ball  - 2 x daily - 5 x weekly - 1 sets - 10 reps - 3s hold  ASSESSMENT:  CLINICAL IMPRESSION:    08/21/2023 Patient has attended 4 PT sessions to address persistent low back pain. She is demonstrating good improvement of pain severity with daily activities, as well as improved 30sec STS. She is making good progress towards established rehab goals. However,. she continues to have significant pain and difficulty with lifting, carrying, and moderate-to-heavy household activities. She requires ongoing skilled PT intervention in order to address remaining deficits and progress towards functional rehab goals.       EVAL: Patient is a 47 y.o. female who was seen today for physical therapy evaluation and treatment for chronic low back pain. No neuro tension signs elicited but core and LE weakness noted. Trunk  and LE ROM WFL.  Patient would benefit from a HEP to improve core strength and function.  OBJECTIVE IMPAIRMENTS: decreased activity tolerance, decreased endurance, decreased knowledge of condition, decreased mobility, increased fascial restrictions, impaired perceived functional ability, increased muscle spasms, improper body mechanics, postural dysfunction, obesity, and pain.   ACTIVITY LIMITATIONS: carrying, lifting, bending, standing, and bed mobility  PERSONAL FACTORS: Age, Fitness,  Past/current experiences, and Time since onset of injury/illness/exacerbation are also affecting patient's functional outcome.   REHAB POTENTIAL: Fair based on chronicity  CLINICAL DECISION MAKING: Stable/uncomplicated  EVALUATION COMPLEXITY: Moderate   GOALS: Goals reviewed with patient? No    RT TERM GOALS=LONG TERM GOALS: Target date: 09/22/2023   Patient will increase 30s chair stand reps from 7 to 9 without arms to demonstrate and improved functional ability with less pain/difficulty as well as reduce fall risk.  Baseline: 7 7/285/25: 9 Goal status: MET  2.  Patient will acknowledge 6/10 pain at least once during episode of care   Baseline: 9/10 08/20/2023 6/10 Goal status: MET  3.  Patient will score at least 8/50 on ODI to signify clinically meaningful improvement in functional abilities.   Baseline: 12/50 08/20/2023: 9/50 Goal status: Nearly Met  4.  Patient to demonstrate independence in HEP  Baseline: [email or web address] Goal status: MET  5. Patient will report at least 5/10 NPRS or less with household activities, including cleaning and carrying trash bags.  Baseline: 7-9/10   Goal status: INITIAL as of 08/20/2023  6. Patient will demonstrate ability to perform floor to waist lifting of at least 25# using appropriate body mechanics and with no more than minimal pain in order to safely perform normal daily/occupational tasks.   Baseline: pain and poor lifting mechanics with 15lb box   Goal  Status: INITIAL as of 08/20/2023     PLAN:  PT FREQUENCY: 1x/week  PT DURATION: 4 weeks, as of 08/20/2023   PLANNED INTERVENTIONS: 97110-Therapeutic exercises, 97530- Therapeutic activity, 97112- Neuromuscular re-education, 97535- Self Care, [id number]- Manual therapy, and Patient/Family education.  PLAN FOR NEXT SESSION: continue progression of core mm endurance with weight bearing activities and functional movements, address lifting mechanics, sled-push and pulling and other neuromuscular reeducation as indicated in order to reach LTG  For all possible CPT codes, reference the Planned Interventions line above.     Check all conditions that are expected to impact treatment: {Conditions expected to impact treatment:Morbid obesity   If treatment provided at initial evaluation, no treatment charged due to lack of authorization.       Marko Molt, PT, DPT  08/21/2023 5:51 PM

## 2023-08-24 ENCOUNTER — Encounter: Payer: Self-pay | Admitting: Family Medicine

## 2023-08-24 ENCOUNTER — Other Ambulatory Visit: Payer: Self-pay | Admitting: Obstetrics and Gynecology

## 2023-08-24 DIAGNOSIS — Z1231 Encounter for screening mammogram for malignant neoplasm of breast: Secondary | ICD-10-CM

## 2023-08-25 NOTE — Therapy (Deleted)
 OUTPATIENT PHYSICAL THERAPY NOTE   Patient Name: Katherine [name] MRN: [id number] DOB:Oct 29, 1975, 48 y.o., female Today's Date: 08/25/2023  END OF SESSION:       Past Medical History:  Diagnosis Date   Abdominal pain    Anemia    Depression    postpartum depression   Morbid obesity (HCC)    Splenomegaly    Past Surgical History:  Procedure Laterality Date   nexplanon     ROBOTIC ASSISTED LAPAROSCOPIC VENTRAL/INCISIONAL HERNIA REPAIR N/A 01/04/2018   Procedure: ROBOTIC ASSISTED LAPAROSCOPIC VENTRAL/INCISIONAL HERNIA REPAIR;  Surgeon: Jordis Laneta FALCON, MD;  Location: [facility] ORS;  Service: General;  Laterality: N/A;   ROBOTIC ASSISTED SALPINGO OOPHERECTOMY Bilateral 01/04/2018   Procedure: ROBOTIC ASSISTED SALPINGECTOMY;  Surgeon: Lake Read, MD;  Location: [facility] ORS;  Service: Gynecology;  Laterality: Bilateral;   TUBAL LIGATION     UPPER GASTROINTESTINAL ENDOSCOPY     UPPER GI ENDOSCOPY  2015   Patient Active Problem List   Diagnosis Date Noted   Class 2 severe obesity due to excess calories with serious comorbidity and body mass index (BMI) of 39.0 to 39.9 in adult [facility]) 08/19/2023   Screening mammogram for breast cancer 08/19/2023   Family history of diabetes mellitus in father 08/19/2023   Eye pain, right 05/31/2023   Headache disorder 05/31/2023   Class 2 obesity with body mass index (BMI) of 38.0 to 38.9 in adult 05/31/2023   Bilateral thoracic back pain 01/23/2023   Screening for colon cancer 01/23/2023   Muscle spasm 01/23/2023   Establishing care with new doctor, encounter for 01/23/2023   Immunization due 01/23/2023   Hypertriglyceridemia 01/23/2023   Cervical polyp 01/05/2023   Menorrhagia, premenopausal 09/29/2022   Heart failure with preserved ejection fraction (HCC) 09/29/2022   Vaginal discharge 09/29/2022   Chest pain 03/23/2022   History of COVID-19 05/03/2019   Herpes zoster without complication 05/03/2019   Chronic bilateral low back  pain without sciatica 05/03/2019   Ventral hernia without obstruction or gangrene    Spleen enlarged 07/31/2013   History of anemia 07/31/2013   Dental caries 09/06/2012    PCP: Petrina Pries, NP   REFERRING PROVIDER: Addie Cordella Hamilton, MD  REFERRING DIAG: [phone] (ICD-10-CM) - Chronic right-sided low back pain with right-sided sciatica  Rationale for Evaluation and Treatment: Rehabilitation  THERAPY DIAG:  No diagnosis found.  ONSET DATE: 2+ years  SUBJECTIVE:                                                                                                                                                                                           SUBJECTIVE STATEMENT: Patient reports  6-7/10 today. She feels that she has noticed some improvement since starting PT. She is complaint with HEP and would like to continue with PT to improve tolerance of household activities including pain with lifting.   HPI: Katherine [name] is a 48 y.o. female who presents to the office reporting right knee pain of 2 months duration and back pain of 2 years duration.  The right knee has been going on for about 2 months with no known history of injury.  Does report some weakness and giving way as well as some locking symptoms.  Does not wake her up from sleep at night.  Her back is much worse than the knee.   Patient also describes radicular pain down that right-hand side along with numbness and tingling.  Does radiate down to the foot.  Tried ibuprofen  without much relief.  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  Hurts for her to pick things up.  Using a back support brace does help.  Does hurt her cough and sneeze.  Walking hurts her more than sitting.[name]    PERTINENT HISTORY:   Plan: Impression is right knee pain with possible meniscal pathology.  Radiographs are underwhelming in terms of any bony findings.  Overall they  look good.  Plan is aspiration and injection of that right knee today.  Regarding the back she does not have any red flag symptoms today.  No fevers or chills.  No weakness.  Would like for her to do therapy for several weeks and then return in 4 weeks with decision for or against MRI scanning and possible ESI of the back at that time.  We can also recheck to see how her knee is doing.   PAIN:  Are you having pain? Yes: NPRS scale: 9/10 at worse Pain location: Low back and RLE Pain description: ache, sharp Aggravating factors: bending, lifting Relieving factors: ibuprofen  and supine  PRECAUTIONS: None  RED FLAGS: None   WEIGHT BEARING RESTRICTIONS: No  FALLS:  Has patient fallen in last 6 months? No  OCCUPATION: housecleaning  PLOF: Independent  PATIENT GOALS: To manage my back pain  NEXT MD VISIT: 4 weeks  OBJECTIVE:  Note: Objective measures were completed at Evaluation unless otherwise noted.  DIAGNOSTIC FINDINGS:  XR Lumbar Spine 2-3 Views Result Date: 07/09/2023 AP lateral radiographs lumbar spine reviewed.  Normal lordosis is present.  No acute fracture.  No spondylolisthesis.  Mild to moderate facet arthritis is present at L4-5 and L5-S1.  No significant degenerative disc disease is present between the vertebral bodies or around the other facet joints.  PATIENT SURVEYS:  Modified Oswestry: 12/50 24% perceived disability  Interpretation of scores: Score Category Description  0-20% Minimal Disability The patient can cope with most living activities. Usually no treatment is indicated apart from advice on lifting, sitting and exercise  21-40% Moderate Disability The patient experiences more pain and difficulty with sitting, lifting and standing. Travel and social life are more difficult and they may be disabled from work. Personal care, sexual activity and sleeping are not grossly affected, and the patient can usually be managed by conservative means  41-60% Severe  Disability Pain remains the main problem in this group, but activities of daily living are affected. These patients require a detailed investigation  61-80% Crippled Back pain impinges on all aspects of the patient's life. Positive intervention is required  81-100% Bed-bound  These patients are either bed-bound  or exaggerating their symptoms  [name] FORBES Zoe DELENA Karon DELENA, et al. Surgery versus conservative management of stable thoracolumbar fracture: the PRESTO feasibility RCT. [place]): [employer]; 2021 Nov. [facility] [employer] Assessment, No. 25.62.) Appendix 3, Oswestry Disability Index category descriptors. Available from: FindJewel[employer].cz  Minimally Clinically Important Difference (MCID) = 12.8%  MUSCLE LENGTH: Hamstrings: Good Thomas test: No tightness with PKB  POSTURE: No Significant postural limitations  PALPATION: deferred  LUMBAR ROM:   AROM eval  Flexion 90%  Extension 90%  Right lateral flexion 75%P!  Left lateral flexion 75%  Right rotation   Left rotation    (Blank rows = not tested)  LOWER EXTREMITY ROM:     Active  Right eval Left eval  Hip flexion    Hip extension    Hip abduction    Hip adduction    Hip internal rotation    Hip external rotation    Knee flexion    Knee extension    Ankle dorsiflexion    Ankle plantarflexion    Ankle inversion    Ankle eversion     (Blank rows = not tested)  LOWER EXTREMITY MMT:    MMT Right eval Left eval  Hip flexion    Hip extension    Hip abduction    Hip adduction    Hip internal rotation    Hip external rotation    Knee flexion    Knee extension    Ankle dorsiflexion    Ankle plantarflexion    Ankle inversion    Ankle eversion     (Blank rows = not tested)  LUMBAR SPECIAL TESTS:  Straight leg raise test: Negative, Slump test: Pain on R, and FABER test: restricted R  FUNCTIONAL TESTS:  30 seconds chair stand test 7 reps  GAIT: Distance  walked: 79ftx2 Assistive device utilized: None Level of assistance: Complete Independence  TREATMENT:    [facility] Adult PT Treatment:                                                DATE: 08/20/2023  Therapeutic Activity:  Nustep L3 8 min Reassessment of objective measures and subjective assessment regarding progress towards established goals and updated plan for addressing remaining deficits and rehab goals.    [facility] Adult PT Treatment:                                                DATE: 08/12/2023   Therapeutic Exercise: Nustep L4 8 min 90/90 60s x2 Seated hamstring stretch 30s x2 B Supine QL stretch  30s x2 B SLR x15 BIL   Neuromuscular re-ed: Supine hip fallouts GTB 15/15 unilaterally Bridge against GTB 15x S/L clams GTB 15/15 Bridge with ball squeeze 2x15   [facility] Adult PT Treatment:                                                DATE: 08/06/23 Therapeutic Exercise: Nustep L4 8 min Neuromuscular re-ed: Supine hip fallouts GTB 15xB, 15/15 unilaterally Bridge against GTB 15x S/L clams GTB 15/15 Bridge with ball squeeze 15x  Therapeutic Activity: Seated hamstring stretch 30s x2 B 90/90 60s x2 Supine QL stretch  30s x2 B    [facility] Adult PT Treatment:                                                DATE: 07/23/23 Eval and HEP Self Care: Additional minutes spent for educating on updated Therapeutic Home Exercise Program as well as comparing current status to condition at start of symptoms. This included exercises focusing on stretching, strengthening, with focus on eccentric aspects. Long term goals include an improvement in range of motion, strength, endurance as well as avoiding reinjury. Patient's frequency would include in 1-2 times a day, 3-5 times a week for a duration of 6-12 weeks. Proper technique shown and discussed handout in great detail. All questions were discussed and addressed.                                                                                                                            PATIENT EDUCATION:  Education details: Discussed eval findings, rehab rationale and POC and patient is in agreement  Person educated: Patient Education method: Explanation and Handouts Education comprehension: verbalized understanding and needs further education  HOME EXERCISE PROGRAM: Access Code: [email or web address] URL: [email or web address] Date: 07/23/2023 Prepared by: Reyes Kohut  Exercises - Supine 90/90 Abdominal Bracing  - 2 x daily - 5 x weekly - 1 sets - 2 reps - 60s hold - Sit to Stand with Arms Crossed  - 2 x daily - 5 x weekly - 1 sets - 5 reps - Seated Abdominal Press into Swiss Ball  - 2 x daily - 5 x weekly - 1 sets - 10 reps - 3s hold  ASSESSMENT:  CLINICAL IMPRESSION:    08/25/2023 Patient has attended 4 PT sessions to address persistent low back pain. She is demonstrating good improvement of pain severity with daily activities, as well as improved 30sec STS. She is making good progress towards established rehab goals. However,. she continues to have significant pain and difficulty with lifting, carrying, and moderate-to-heavy household activities. She requires ongoing skilled PT intervention in order to address remaining deficits and progress towards functional rehab goals.       EVAL: Patient is a 48 y.o. female who was seen today for physical therapy evaluation and treatment for chronic low back pain. No neuro tension signs elicited but core and LE weakness noted. Trunk and LE ROM WFL.  Patient would benefit from a HEP to improve core strength and function.  OBJECTIVE IMPAIRMENTS: decreased activity tolerance, decreased endurance, decreased knowledge of condition, decreased mobility, increased fascial restrictions, impaired perceived functional ability, increased muscle spasms, improper body mechanics, postural dysfunction, obesity, and pain.   ACTIVITY LIMITATIONS: carrying, lifting, bending, standing, and bed mobility  PERSONAL  FACTORS: Age, Fitness, Past/current experiences, and Time  since onset of injury/illness/exacerbation are also affecting patient's functional outcome.   REHAB POTENTIAL: Fair based on chronicity  CLINICAL DECISION MAKING: Stable/uncomplicated  EVALUATION COMPLEXITY: Moderate   GOALS: Goals reviewed with patient? No    RT TERM GOALS=LONG TERM GOALS: Target date: 09/22/2023   Patient will increase 30s chair stand reps from 7 to 9 without arms to demonstrate and improved functional ability with less pain/difficulty as well as reduce fall risk.  Baseline: 7 7/285/25: 9 Goal status: MET  2.  Patient will acknowledge 6/10 pain at least once during episode of care   Baseline: 9/10 08/20/2023 6/10 Goal status: MET  3.  Patient will score at least 8/50 on ODI to signify clinically meaningful improvement in functional abilities.   Baseline: 12/50 08/20/2023: 9/50 Goal status: Nearly Met  4.  Patient to demonstrate independence in HEP  Baseline: [email or web address] Goal status: MET  5. Patient will report at least 5/10 NPRS or less with household activities, including cleaning and carrying trash bags.  Baseline: 7-9/10   Goal status: INITIAL as of 08/20/2023  6. Patient will demonstrate ability to perform floor to waist lifting of at least 25# using appropriate body mechanics and with no more than minimal pain in order to safely perform normal daily/occupational tasks.   Baseline: pain and poor lifting mechanics with 15lb box   Goal Status: INITIAL as of 08/20/2023     PLAN:  PT FREQUENCY: 1x/week  PT DURATION: 4 weeks, as of 08/20/2023   PLANNED INTERVENTIONS: 97110-Therapeutic exercises, 97530- Therapeutic activity, 97112- Neuromuscular re-education, 97535- Self Care, 02859- Manual therapy, and Patient/Family education.  PLAN FOR NEXT SESSION: continue progression of core mm endurance with weight bearing activities and functional movements, address lifting mechanics, sled-push and  pulling and other neuromuscular reeducation as indicated in order to reach LTG  For all possible CPT codes, reference the Planned Interventions line above.     Check all conditions that are expected to impact treatment: {Conditions expected to impact treatment:Morbid obesity   If treatment provided at initial evaluation, no treatment charged due to lack of authorization.       Marko Molt, PT, DPT  08/25/2023 4:20 PM

## 2023-08-27 ENCOUNTER — Telehealth: Payer: Self-pay

## 2023-08-27 ENCOUNTER — Ambulatory Visit: Attending: Orthopedic Surgery

## 2023-08-27 NOTE — Telephone Encounter (Signed)
 Patient message sent through MyChart informing her of missed appointment and need to schedule additional visits

## 2023-08-30 ENCOUNTER — Ambulatory Visit: Payer: Self-pay | Admitting: Family Medicine

## 2023-08-30 NOTE — Assessment & Plan Note (Signed)
-   Advise weight loss. - Follow up with cardiologist in one year for further testing.

## 2023-08-30 NOTE — Progress Notes (Signed)
 Your cholesterol levels have improved greatly keep up the good work, low-fat diet and exercise is still encouraged.  Your A1c has come down to 5.5 continue low-carb diet and exercise also.  Thank you

## 2023-10-28 ENCOUNTER — Ambulatory Visit

## 2023-10-28 ENCOUNTER — Encounter

## 2023-11-29 ENCOUNTER — Encounter: Payer: Self-pay | Admitting: Radiology

## 2023-12-04 ENCOUNTER — Emergency Department (HOSPITAL_BASED_OUTPATIENT_CLINIC_OR_DEPARTMENT_OTHER)

## 2023-12-04 ENCOUNTER — Encounter (HOSPITAL_BASED_OUTPATIENT_CLINIC_OR_DEPARTMENT_OTHER): Payer: Self-pay | Admitting: Emergency Medicine

## 2023-12-04 ENCOUNTER — Emergency Department (HOSPITAL_BASED_OUTPATIENT_CLINIC_OR_DEPARTMENT_OTHER)
Admission: EM | Admit: 2023-12-04 | Discharge: 2023-12-05 | Disposition: A | Discharge status: Home / Self Care | Attending: Emergency Medicine | Admitting: Emergency Medicine

## 2023-12-04 DIAGNOSIS — R0789 Other chest pain: Secondary | ICD-10-CM | POA: Insufficient documentation

## 2023-12-04 DIAGNOSIS — M25512 Pain in left shoulder: Secondary | ICD-10-CM | POA: Insufficient documentation

## 2023-12-04 LAB — BASIC METABOLIC PANEL WITH GFR
Anion gap: 12 (ref 5–15)
BUN: 15 mg/dL (ref 6–20)
CO2: 22 mmol/L (ref 22–32)
Calcium: 9 mg/dL (ref 8.9–10.3)
Chloride: 105 mmol/L (ref 98–111)
Creatinine, Ser: 0.56 mg/dL (ref 0.44–1.00)
GFR, Estimated: >60 mL/min (ref >60)
Glucose, Bld: 116 mg/dL — ABNORMAL HIGH (ref 70–99)
Potassium: 3.4 mmol/L — ABNORMAL LOW (ref 3.5–5.1)
Sodium: 139 mmol/L (ref 135–145)

## 2023-12-04 LAB — CBC
HCT: 32.9 % — ABNORMAL LOW (ref 36.0–46.0)
Hemoglobin: 11 g/dL — ABNORMAL LOW (ref 12.0–15.0)
MCH: 27.7 pg (ref 26.0–34.0)
MCHC: 33.4 g/dL (ref 30.0–36.0)
MCV: 82.9 fL (ref 80.0–100.0)
Platelets: 285 K/uL (ref 150–400)
RBC: 3.97 MIL/uL (ref 3.87–5.11)
RDW: 12.8 % (ref 11.5–15.5)
WBC: 10.4 K/uL (ref 4.0–10.5)
nRBC: 0 % (ref 0.0–0.2)

## 2023-12-04 LAB — TROPONIN T, HIGH SENSITIVITY
Troponin T High Sensitivity: <15 ng/L (ref 0–19)
Troponin T High Sensitivity: <15 ng/L (ref 0–19)

## 2023-12-04 LAB — PREGNANCY, URINE: Preg Test, Ur: NEGATIVE

## 2023-12-04 MED ORDER — LIDOCAINE 5 % EX PTCH
1.0000 | MEDICATED_PATCH | CUTANEOUS | 0 refills | Status: AC
  Filled 2023-12-04: qty 7, 7d supply, fill #0

## 2023-12-04 NOTE — ED Provider Notes (Incomplete)
 [place] EMERGENCY DEPARTMENT AT [facility] Provider Note   CSN: [id number] Arrival date & time: 12/04/23  2035     Patient presents with: Chest Pain   Katherine [name] is a 48 y.o. female.  {Add pertinent medical, surgical, social history, OB history to YEP:67052} Patient is here for evaluation of left shoulder, left upper arm, and left anterior wall chest pain has been worsening since Wednesday of this week.  Patient has reproducible pain on palpation.  Patient reports left upper extremity numbness for the past 2 weeks that has transitioned into more of a pain since Wednesday.  She denies any recent injuries.  She has had pain like this in the past, specifically in June of this year.  She reports she was seen for this and prescribed muscle relaxers and pain medication which helped.  I cannot find this visit however I did find a visit in June of this year for evaluation of right knee pain diagnosed to be synovitis of the right knee was treated with a joint injection followed by physical therapy treatment. She denies new injury at that time.  She reports the pain is worse with sweeping and mopping.  She reports limited mobility of the left upper extremity beginning Wednesday and has been using over-the-counter patches for muscle strain with some relief.  She denies any current numbness of the left upper extremity, stating it is pain.  She denies any shortness of breath, palpitations, dizziness, syncope, nausea vomiting or diarrhea, abdominal pain, recent fevers or illness.  She denies any recent falls or trauma to the left upper extremity.  She denies any surgeries to the left upper extremity.  She is right-handed.  Patient was diagnosed with heart failure in June of last year.  Patient speaks limited English so her daughter interpreted much of our visit today.  The history is provided by the patient.  Shoulder Pain Location:  Shoulder and arm Shoulder location:  L  shoulder Arm location:  L arm Injury: no   Pain details:    Quality:  Sharp, throbbing and pressure   Radiates to:  L shoulder and L arm   Severity:  Moderate   Onset quality:  Gradual   Duration:  3 days Handedness:  Right-handed Dislocation: no   Prior injury to area:  No Worsened by:  Exercise, stretching area and movement Associated symptoms: decreased range of motion, numbness and swelling   Associated symptoms: no back pain, no muscle weakness, no neck pain and no stiffness   Risk factors: no concern for non-accidental trauma, no known bone disorder, no frequent fractures and no recent illness        Prior to Admission medications   Medication Sig Start Date End Date Taking? Authorizing Provider  ibuprofen  (ADVIL ) 600 MG tablet Take 1 tablet (600 mg total) by mouth every 6 (six) hours as needed. 01/08/23   Petrina Pries, NP  Iron , Ferrous Sulfate , 325 (65 Fe) MG TABS Take 1 tablet by mouth daily. 08/14/22   Danton Jon HERO, PA-C  Rimegepant Sulfate  (NURTEC) 75 MG TBDP Take 1 tablet (75 mg total) by mouth as needed. 05/31/23   Petrina Pries, NP    Allergies: Patient has no known allergies.    Review of Systems  HENT:  Negative for congestion.   Respiratory:  Negative for cough, chest tightness, shortness of breath and wheezing.   Cardiovascular:  Negative for palpitations.  Gastrointestinal:  Negative for abdominal pain, constipation, diarrhea, nausea and vomiting.  Musculoskeletal:  Negative  for back pain, joint swelling, neck pain and stiffness.       Left shoulder and left upper arm pain with movement and palpation  Skin:  Negative for color change.  Neurological:  Negative for light-headedness and headaches.    Updated Vital Signs BP 133/71   Pulse 63   Temp 98.2 F (36.8 C) (Oral)   Resp 17   LMP 11/15/2023 (Approximate)   SpO2 99%   Physical Exam Vitals and nursing note reviewed.  Constitutional:      General: She is not in acute distress.    Appearance:  She is well-developed. She is obese. She is not ill-appearing.  HENT:     Head: Normocephalic and atraumatic.  Eyes:     Extraocular Movements: Extraocular movements intact.  Cardiovascular:     Rate and Rhythm: Normal rate and regular rhythm.     Heart sounds: Normal heart sounds.  Pulmonary:     Effort: Pulmonary effort is normal. No tachypnea, accessory muscle usage or respiratory distress.     Breath sounds: Normal breath sounds. No stridor. No decreased breath sounds, wheezing, rhonchi or rales.  Chest:     Chest wall: Tenderness (Anterior upper left chest wall pain reproducible with palpation) present. No mass, deformity, crepitus or edema.  Abdominal:     General: Bowel sounds are normal.     Palpations: Abdomen is soft.  Musculoskeletal:     Right shoulder: Normal.     Left shoulder: Tenderness present. No swelling, deformity, effusion, laceration, bony tenderness or crepitus. Decreased range of motion. Decreased strength. Normal pulse.     Right upper arm: Normal.     Left upper arm: Tenderness present. No swelling, edema or deformity.     Right elbow: Normal.     Left elbow: No swelling or deformity. Normal range of motion. No tenderness.     Right forearm: Normal.     Left forearm: No swelling, edema or tenderness.       Arms:     Comments: Pain with palpation of posterior and anterior shoulder as well as left deltoid.  Pain with palpation of external and internal rotation of the left upper extremity.  Pain to the bicep with extension and flexion of the left upper extremity. no obvious crepitus or deformity.  Grip strength of the left upper extremity intact.  Skin:    General: Skin is warm and dry.     Coloration: Skin is not pale.  Neurological:     Mental Status: She is alert and oriented to person, place, and time.     (all labs ordered are listed, but only abnormal results are displayed) Labs Reviewed  BASIC METABOLIC PANEL WITH GFR - Abnormal; Notable for the  following components:      Result Value   Potassium 3.4 (*)    Glucose, Bld 116 (*)    All other components within normal limits  CBC - Abnormal; Notable for the following components:   Hemoglobin 11.0 (*)    HCT 32.9 (*)    All other components within normal limits  PREGNANCY, URINE  TROPONIN T, HIGH SENSITIVITY  TROPONIN T, HIGH SENSITIVITY    EKG: None  Radiology: [facility] Chest Port 1 View Result Date: 12/04/2023 EXAM: 1 VIEW(S) XRAY OF THE CHEST 12/04/2023 09:30:00 PM COMPARISON: Comparison study 03/23/2022. CLINICAL HISTORY: Left-sided chest pain radiating into the neck. FINDINGS: LUNGS AND PLEURA: No focal pulmonary opacity. No pleural effusion. HEART AND MEDIASTINUM: Cardiac shadow is stable. BONES AND SOFT  TISSUES: No acute osseous abnormality. IMPRESSION: 1. No acute cardiopulmonary pathology. Electronically signed by: Oneil Devonshire MD 12/04/2023 09:37 PM EST RP Workstation: [id number]    {Document cardiac monitor, telemetry assessment procedure when appropriate:32947} Procedures   Medications Ordered in the ED - No data to display    {Click here for ABCD2, HEART and other calculators REFRESH Note before signing:1}                              Medical Decision Making Amount and/or Complexity of Data Reviewed Labs: ordered. Radiology: ordered.   ***  {Document critical care time when appropriate  Document review of labs and clinical decision tools ie CHADS2VASC2, etc  Document your independent review of radiology images and any outside records  Document your discussion with family members, caretakers and with consultants  Document social determinants of health affecting pt's care  Document your decision making why or why not admission, treatments were needed:32947:::1}   Final diagnoses:  None    ED Discharge Orders     None

## 2023-12-04 NOTE — ED Provider Notes (Signed)
 [name] EMERGENCY DEPARTMENT AT [facility] Provider Note   CSN: [id number] Arrival date & time: 12/04/23  2035     Patient presents with: Chest Pain   Katherine [name] is a 48 y.o. female.   Patient is here for evaluation of left shoulder, left upper arm, and left anterior wall chest pain has been worsening since Wednesday of this week.  Patient has reproducible pain on palpation.  Patient reports left upper extremity numbness for the past 2 weeks that has transitioned into more of a pain since Wednesday.  She denies any recent injuries.  She has had pain like this in the past, specifically in June of this year.  She reports she was seen for this and prescribed muscle relaxers and pain medication which helped.  I cannot find this visit however I did find a visit in June of this year for evaluation of right knee pain diagnosed to be synovitis of the right knee was treated with a joint injection followed by physical therapy treatment. She denies new injury at that time.  She reports the pain is worse with sweeping and mopping.  She reports limited mobility of the left upper extremity beginning Wednesday and has been using over-the-counter patches for muscle strain with some relief.  She denies any current numbness of the left upper extremity, stating it is pain.  She denies any shortness of breath, palpitations, dizziness, syncope, nausea vomiting or diarrhea, abdominal pain, recent fevers or illness.  She denies any recent falls or trauma to the left upper extremity.  She denies any surgeries to the left upper extremity.  She is right-handed.  Patient was diagnosed with heart failure in June of last year.  Patient speaks limited English so her daughter interpreted much of our visit today.  The history is provided by the patient.  Shoulder Pain Location:  Shoulder and arm Shoulder location:  L shoulder Arm location:  L arm Injury: no   Pain details:    Quality:  Sharp,  throbbing and pressure   Radiates to:  L shoulder and L arm   Severity:  Moderate   Onset quality:  Gradual   Duration:  3 days Handedness:  Right-handed Dislocation: no   Prior injury to area:  No Worsened by:  Exercise, stretching area and movement Associated symptoms: decreased range of motion, numbness and swelling   Associated symptoms: no back pain, no muscle weakness, no neck pain and no stiffness   Risk factors: no concern for non-accidental trauma, no known bone disorder, no frequent fractures and no recent illness        Prior to Admission medications   Medication Sig Start Date End Date Taking? Authorizing Provider  ibuprofen  (ADVIL ) 600 MG tablet Take 1 tablet (600 mg total) by mouth every 6 (six) hours as needed. 01/08/23   Petrina Pries, NP  Iron , Ferrous Sulfate , 325 (65 Fe) MG TABS Take 1 tablet by mouth daily. 08/14/22   Danton Jon HERO, PA-C  Rimegepant Sulfate  (NURTEC) 75 MG TBDP Take 1 tablet (75 mg total) by mouth as needed. 05/31/23   Petrina Pries, NP    Allergies: Patient has no known allergies.    Review of Systems  HENT:  Negative for congestion.   Respiratory:  Negative for cough, chest tightness, shortness of breath and wheezing.   Cardiovascular:  Negative for palpitations.  Gastrointestinal:  Negative for abdominal pain, constipation, diarrhea, nausea and vomiting.  Musculoskeletal:  Negative for back pain, joint swelling, neck pain and stiffness.  Left shoulder and left upper arm pain with movement and palpation  Skin:  Negative for color change.  Neurological:  Negative for light-headedness and headaches.    Updated Vital Signs BP 133/71   Pulse 63   Temp 98.2 F (36.8 C) (Oral)   Resp 17   LMP 11/15/2023 (Approximate)   SpO2 99%   Physical Exam Vitals and nursing note reviewed.  Constitutional:      General: She is not in acute distress.    Appearance: She is well-developed. She is obese. She is not ill-appearing.  HENT:     Head:  Normocephalic and atraumatic.  Eyes:     Extraocular Movements: Extraocular movements intact.  Cardiovascular:     Rate and Rhythm: Normal rate and regular rhythm.     Heart sounds: Normal heart sounds.  Pulmonary:     Effort: Pulmonary effort is normal. No tachypnea, accessory muscle usage or respiratory distress.     Breath sounds: Normal breath sounds. No stridor. No decreased breath sounds, wheezing, rhonchi or rales.  Chest:     Chest wall: Tenderness (Anterior upper left chest wall pain reproducible with palpation) present. No mass, deformity, crepitus or edema.  Abdominal:     General: Bowel sounds are normal.     Palpations: Abdomen is soft.  Musculoskeletal:     Right shoulder: Normal.     Left shoulder: Tenderness present. No swelling, deformity, effusion, laceration, bony tenderness or crepitus. Decreased range of motion. Decreased strength. Normal pulse.     Right upper arm: Normal.     Left upper arm: Tenderness present. No swelling, edema or deformity.     Right elbow: Normal.     Left elbow: No swelling or deformity. Normal range of motion. No tenderness.     Right forearm: Normal.     Left forearm: No swelling, edema or tenderness.       Arms:     Comments: Pain with palpation of posterior and anterior shoulder as well as left deltoid.  Pain with palpation of external and internal rotation of the left upper extremity.  Pain to the bicep with extension and flexion of the left upper extremity. no obvious crepitus or deformity.  Grip strength of the left upper extremity intact.  Skin:    General: Skin is warm and dry.     Coloration: Skin is not pale.  Neurological:     Mental Status: She is alert and oriented to person, place, and time.     (all labs ordered are listed, but only abnormal results are displayed) Labs Reviewed  BASIC METABOLIC PANEL WITH GFR - Abnormal; Notable for the following components:      Result Value   Potassium 3.4 (*)    Glucose, Bld 116  (*)    All other components within normal limits  CBC - Abnormal; Notable for the following components:   Hemoglobin 11.0 (*)    HCT 32.9 (*)    All other components within normal limits  PREGNANCY, URINE  TROPONIN T, HIGH SENSITIVITY  TROPONIN T, HIGH SENSITIVITY    EKG: None  Radiology: [facility] Chest Port 1 View Result Date: 12/04/2023 EXAM: 1 VIEW(S) XRAY OF THE CHEST 12/04/2023 09:30:00 PM COMPARISON: Comparison study 03/23/2022. CLINICAL HISTORY: Left-sided chest pain radiating into the neck. FINDINGS: LUNGS AND PLEURA: No focal pulmonary opacity. No pleural effusion. HEART AND MEDIASTINUM: Cardiac shadow is stable. BONES AND SOFT TISSUES: No acute osseous abnormality. IMPRESSION: 1. No acute cardiopulmonary pathology. Electronically signed by: Oneil  Lukens MD 12/04/2023 09:37 PM EST RP Workstation: [id number]     Procedures   Medications Ordered in the ED - No data to display    Patient presents to the ED for concern of chest pain, this involves an extensive number of treatment options, and is a complaint that carries with it a high risk of complications and morbidity.  The differential diagnosis includes MI, heart failure exacerbation, biliary disease, dissection, PE, pneumonia, muscle strain, costochondritis, pancreatitis.   Co morbidities that complicate the patient evaluation  Heart failure   Additional history obtained:  Additional history obtained from  Outside Medical Records   External records from outside source obtained and reviewed including recent orthopedic notes and primary care notes.   Lab Tests:  I Ordered, and personally interpreted labs.  The pertinent results include: Unremarkable blood work and troponin   Imaging Studies ordered:  I ordered imaging studies including chest and shoulder x-ray I independently visualized and interpreted imaging which showed no acute dislocation or fractures or chest abnormalities I agree with the radiologist  interpretation   Cardiac Monitoring:  EKG shows normal sinus rhythm with no evidence of STEMI.   Problem List / ED Course:  Patient is here for evaluation of chest pain which on physical exam appears to be left anterior and posterior shoulder pain as well as left deltoid pain --- musculoskeletal in nature.  This pain is reproducible with palpation and worse with movement.  Cardiac workup was unremarkable.  Reevaluation:  After the interventions noted above, I reevaluated the patient and found that they have :stable   Dispostion:  After consideration of the diagnostic results and the patients response to treatment, I feel that the patent would benefit from supportive care in the home setting and follow-up with primary care or orthopedics for musculoskeletal left shoulder pain in the nonemergent setting.  Lidocaine  patches sent to the patient's pharmacy for pain management until she can get in with her physician for further evaluation.  She will return to our facility for further workup if she develops any concerning symptoms.                                   Medical Decision Making Amount and/or Complexity of Data Reviewed Labs: ordered. Radiology: ordered.  Risk Prescription drug management.        Final diagnoses:  None    ED Discharge Orders     None          Rosina Almarie DELENA DEVONNA 12/05/23 0058    Emil Share, DO 12/05/23 1455

## 2023-12-04 NOTE — Discharge Instructions (Addendum)
 As we discussed there were no acute cardiac findings on our workup today.  Your pain appears to be musculoskeletal in nature.  I have sent patient lidocaine  patches to your pharmacy for continued pain relief until you can get in with your primary care or established orthopedic office for further management.  Please return if you develop chest pain, shortness of breath, dizziness, loss of consciousness, confusion, fast heart rate, or any other concerning symptoms.  Como comentamos, hoy no se detectaron problemas cardacos agudos en la evaluacin. Su dolor parece ser de origen musculoesqueltico. Le envi parches de lidocana a su farmacia para que contine aliviando el dolor hasta que pueda consultar con su mdico de cabecera o con su traumatlogo habitual para recibir engineer, agricultural.  Por favor, regrese si presenta dolor en el pecho, dificultad para respirar, mareo, prdida del conocimiento, confusin, taquicardia o cualquier otro sntoma preocupante.

## 2023-12-04 NOTE — ED Triage Notes (Signed)
 Left chest pain into left side neck and left arm X a few days getting worse Some sob when it started

## 2023-12-05 ENCOUNTER — Other Ambulatory Visit: Payer: Self-pay

## 2023-12-06 ENCOUNTER — Other Ambulatory Visit: Payer: Self-pay

## 2023-12-15 ENCOUNTER — Other Ambulatory Visit: Payer: Self-pay

## 2023-12-22 ENCOUNTER — Encounter: Payer: Self-pay | Admitting: Orthopedic Surgery

## 2023-12-22 ENCOUNTER — Other Ambulatory Visit: Payer: Self-pay

## 2023-12-22 ENCOUNTER — Ambulatory Visit (INDEPENDENT_AMBULATORY_CARE_PROVIDER_SITE_OTHER): Payer: Self-pay | Admitting: Orthopedic Surgery

## 2023-12-22 DIAGNOSIS — G8929 Other chronic pain: Secondary | ICD-10-CM

## 2023-12-22 DIAGNOSIS — M25512 Pain in left shoulder: Secondary | ICD-10-CM

## 2023-12-22 DIAGNOSIS — M65912 Unspecified synovitis and tenosynovitis, left shoulder: Secondary | ICD-10-CM

## 2023-12-22 MED ORDER — LIDOCAINE HCL 1 % IJ SOLN
5.0000 mL | INTRAMUSCULAR | Status: AC | PRN
  Administered 2023-12-22: 5 mL

## 2023-12-22 MED ORDER — BUPIVACAINE HCL 0.5 % IJ SOLN
9.0000 mL | INTRAMUSCULAR | Status: AC | PRN
  Administered 2023-12-22: 9 mL via INTRA_ARTICULAR

## 2023-12-22 MED ORDER — TRIAMCINOLONE ACETONIDE 40 MG/ML IJ SUSP
40.0000 mg | INTRAMUSCULAR | Status: AC | PRN
  Administered 2023-12-22: 40 mg via INTRA_ARTICULAR

## 2023-12-22 NOTE — Progress Notes (Signed)
 Office Visit Note   Patient: Katherine [name]           Date of Birth: 01-Feb-1975           MRN: [id number] Visit Date: 12/22/2023 Requested by: Petrina Pries, NP [address] [place],  [place] [address] PCP: Petrina Pries, NP  Subjective: Chief Complaint  Patient presents with   Left Shoulder - Pain    HPI: Katherine [name] is a 48 y.o. female who presents to the office reporting left shoulder arm pain.  Went to the emergency department on 12/04/2023.  Single view of the left shoulder at that time was negative for fracture or dislocation.  She has tried some medication which has not helped.  She feels like her biceps region feels tight.  She felt a pop when she was mopping and has had pain since then.  Does describe limited range of motion.  She is right-hand dominant.  Denies any neck pain or numbness and tingling.  Extension hurts the shoulder primarily anteriorly.  No problem with the shoulder before this mopping experience.  The pain does wake her from sleep at night..                ROS: All systems reviewed are negative as they relate to the chief complaint within the history of present illness.  Patient denies fevers or chills.  Assessment & Plan: Visit Diagnoses:  1. Chronic left shoulder pain     Plan: Impression is left shoulder pain unclear etiology.  Rotator cuff strength is pretty reasonable today.  Not too much coarseness or grinding with passive range of motion.  Glenohumeral joint injection performed today.  Looked at the biceps tendon under ultrasound and there is biceps tendon in the groove.  Not too much fluid in the groove.  May have a partial-thickness subscap tear based on hypoechoic region in the subscap.  Come back in 6 weeks for clinical follow-up about efficacy of the injection and decision for against MRI arthrogram at that time  Follow-Up Instructions: No follow-ups on file.   Orders:  Orders Placed This Encounter  Procedures   US   Guided Needle Placement - No Linked Charges   No orders of the defined types were placed in this encounter.     Procedures: Large Joint Inj: L glenohumeral on 12/22/2023 4:52 PM Indications: diagnostic evaluation and pain Details: 22 G 3.5 in needle, ultrasound-guided posterior approach  Arthrogram: No  Medications: 9 mL bupivacaine  0.5 %; 5 mL lidocaine  1 %; 40 mg triamcinolone  acetonide 40 MG/ML Outcome: tolerated well, no immediate complications Procedure, treatment alternatives, risks and benefits explained, specific risks discussed. Consent was given by the patient. Immediately prior to procedure a time out was called to verify the correct patient, procedure, equipment, support staff and site/side marked as required. Patient was prepped and draped in the usual sterile fashion.       Clinical Data: No additional findings.  Objective: Vital Signs: LMP 11/15/2023 (Approximate)   Physical Exam:  Constitutional: Patient appears well-developed HEENT:  Head: Normocephalic Eyes:EOM are normal Neck: Normal range of motion Cardiovascular: Normal rate Pulmonary/chest: Effort normal Neurologic: Patient is alert Skin: Skin is warm Psychiatric: Patient has normal mood and affect  Ortho Exam: Ortho exam demonstrates bilateral shoulder range of motion of 60/100/165.  Has pretty reasonable rotator cuff strength bilaterally to external rotation of 5 out of 5 and subscap strength at 5 out of 5.  O'Brien's testing is negative on the left and right.  No discrete AC joint tenderness left versus right.  Cervical spine range of motion intact.  No coarse grinding or popping with internal/external rotation at 90 degrees of abduction.  Does localize some tenderness in the bicipital groove with shoulder extension.  Specialty Comments:  No specialty comments available.  Imaging: No results found.   PMFS History: Patient Active Problem List   Diagnosis Date Noted   Class 2 severe obesity  due to excess calories with serious comorbidity and body mass index (BMI) of 39.0 to 39.9 in adult 08/19/2023   Screening mammogram for breast cancer 08/19/2023   Family history of diabetes mellitus in father 08/19/2023   Eye pain, right 05/31/2023   Headache disorder 05/31/2023   Class 2 obesity with body mass index (BMI) of 38.0 to 38.9 in adult 05/31/2023   Bilateral thoracic back pain 01/23/2023   Screening for colon cancer 01/23/2023   Muscle spasm 01/23/2023   Establishing care with new doctor, encounter for 01/23/2023   Immunization due 01/23/2023   Hypertriglyceridemia 01/23/2023   Cervical polyp 01/05/2023   Menorrhagia, premenopausal 09/29/2022   Heart failure with preserved ejection fraction (HCC) 09/29/2022   Vaginal discharge 09/29/2022   Chest pain 03/23/2022   History of COVID-19 05/03/2019   Herpes zoster without complication 05/03/2019   Chronic bilateral low back pain without sciatica 05/03/2019   Ventral hernia without obstruction or gangrene    Spleen enlarged 07/31/2013   History of anemia 07/31/2013   Dental caries 09/06/2012   Past Medical History:  Diagnosis Date   Abdominal pain    Anemia    Depression    postpartum depression   Morbid obesity (HCC)    Splenomegaly     Family History  Problem Relation Age of Onset   Diabetes Mother    Kidney disease Brother    Colon cancer Neg Hx    Rectal cancer Neg Hx    Stomach cancer Neg Hx     Past Surgical History:  Procedure Laterality Date   nexplanon     ROBOTIC ASSISTED LAPAROSCOPIC VENTRAL/INCISIONAL HERNIA REPAIR N/A 01/04/2018   Procedure: ROBOTIC ASSISTED LAPAROSCOPIC VENTRAL/INCISIONAL HERNIA REPAIR;  Surgeon: Jordis Laneta FALCON, MD;  Location: [facility] ORS;  Service: General;  Laterality: N/A;   ROBOTIC ASSISTED SALPINGO OOPHERECTOMY Bilateral 01/04/2018   Procedure: ROBOTIC ASSISTED SALPINGECTOMY;  Surgeon: Lake Read, MD;  Location: [facility] ORS;  Service: Gynecology;  Laterality: Bilateral;    TUBAL LIGATION     UPPER GASTROINTESTINAL ENDOSCOPY     UPPER GI ENDOSCOPY  2015   Social History   Occupational History   Not on file  Tobacco Use   Smoking status: Never    Passive exposure: Never   Smokeless tobacco: Never  Vaping Use   Vaping status: Never Used  Substance and Sexual Activity   Alcohol use: Yes    Comment: occ   Drug use: No   Sexual activity: Yes    Birth control/protection: Surgical    Comment: Tubal ligation

## 2024-01-14 ENCOUNTER — Ambulatory Visit: Admitting: Surgical

## 2024-02-03 ENCOUNTER — Encounter

## 2024-02-03 ENCOUNTER — Ambulatory Visit

## 2024-02-10 ENCOUNTER — Other Ambulatory Visit (HOSPITAL_BASED_OUTPATIENT_CLINIC_OR_DEPARTMENT_OTHER): Payer: Self-pay

## 2024-02-10 ENCOUNTER — Other Ambulatory Visit: Payer: Self-pay

## 2024-02-10 ENCOUNTER — Other Ambulatory Visit: Payer: Self-pay | Admitting: Family Medicine

## 2024-02-10 DIAGNOSIS — M546 Pain in thoracic spine: Secondary | ICD-10-CM

## 2024-02-11 ENCOUNTER — Ambulatory Visit: Admitting: Surgical

## 2024-02-14 NOTE — Progress Notes (Unsigned)
 Ms. Chrisanne Loose is a 49 y.o. female who presents to [facility] clinic today with {Blank single:19197::no complaints,complaint of} ***.    Pap Smear: Pap smear not completed today. Last Pap smear was 03/07/2022 at the [facility] and was normal. Per patient has no history of an abnormal Pap smear. Last Pap smear result is available in Epic.   Physical exam: Breasts Breasts symmetrical. No skin abnormalities bilateral breasts. No nipple retraction bilateral breasts. No nipple discharge bilateral breasts. No lymphadenopathy. No lumps palpated bilateral breasts. Complaints of left outer breast pain on exam.     MM 3D SCREENING MAMMOGRAM BILATERAL BREAST Result Date: 08/25/2022 CLINICAL DATA:  Screening. EXAM: DIGITAL SCREENING BILATERAL MAMMOGRAM WITH TOMOSYNTHESIS AND CAD TECHNIQUE: Bilateral screening digital craniocaudal and mediolateral oblique mammograms were obtained. Bilateral screening digital breast tomosynthesis was performed. The images were evaluated with computer-aided detection. COMPARISON:  Previous exam(s). ACR Breast Density Category b: There are scattered areas of fibroglandular density. FINDINGS: There are no findings suspicious for malignancy. IMPRESSION: No mammographic evidence of malignancy. A result letter of this screening mammogram will be mailed directly to the patient. RECOMMENDATION: Screening mammogram in one year. (Code:SM-B-01Y) BI-RADS CATEGORY  1: Negative. Electronically Signed   By: Dirk Arrant M.D.   On: 08/25/2022 08:52   MS DIGITAL DIAG TOMO BILAT Result Date: 06/03/2021 CLINICAL DATA:  Patient presents for diffuse intermittent left breast pain. EXAM: DIGITAL DIAGNOSTIC BILATERAL MAMMOGRAM WITH TOMOSYNTHESIS AND CAD TECHNIQUE: Bilateral digital diagnostic mammography and breast tomosynthesis was performed. The images were evaluated with computer-aided detection. COMPARISON:  None available. ACR Breast Density Category c:  The breast tissue is heterogeneously dense, which may obscure small masses. FINDINGS: No concerning masses, calcifications or distortion identified within either breast. IMPRESSION: No mammographic evidence for malignancy. RECOMMENDATION: Screening mammogram in one year.(Code:SM-B-01Y) I have discussed the findings and recommendations with the patient. If applicable, a reminder letter will be sent to the patient regarding the next appointment. BI-RADS CATEGORY  1: Negative. Electronically Signed   By: Bard Moats M.D.   On: 06/03/2021 10:20    Pelvic/Bimanual Pap is not indicated today per BCCCP guidelines.   Smoking History: Patient has never smoked.   Patient Navigation: Patient education provided. Access to services provided for patient through [place] program. Spanish interpreter Bernice Angry from [facility] provided.   Colorectal Cancer Screening: Per patient {Blank single:19197::has had colonoscopy completed on ***,has never had colonoscopy completed} No complaints today.    Breast and Cervical Cancer Risk Assessment: Patient does not have family history of breast cancer, known genetic mutations, or radiation treatment to the chest before age 75. Patient does not have history of cervical dysplasia, immunocompromised, or DES exposure in-utero.   Risk Assessment   No risk assessment data for the current encounter  Risk Scores       05/15/2021   Last edited by: Logan Lyle BRAVO, CMA   5-year risk: 0.4%   Lifetime risk: 5%            A: BCCCP exam without pap smear Complaint of ***  P: Referred [facility] to the [facility] for a screening mammogram on mobile unit. Appointment scheduled Tuesday, February 15, 2024 at 1500.  Driscilla Wanda SQUIBB, RN 02/14/2024 12:58 PM

## 2024-02-15 ENCOUNTER — Ambulatory Visit

## 2024-02-15 ENCOUNTER — Inpatient Hospital Stay: Admission: RE | Admit: 2024-02-15 | Source: Ambulatory Visit

## 2024-02-15 DIAGNOSIS — Z1239 Encounter for other screening for malignant neoplasm of breast: Secondary | ICD-10-CM

## 2024-03-03 ENCOUNTER — Encounter: Payer: Self-pay | Admitting: Orthopedic Surgery

## 2024-03-03 ENCOUNTER — Other Ambulatory Visit: Payer: Self-pay

## 2024-03-03 ENCOUNTER — Ambulatory Visit: Admitting: Orthopedic Surgery

## 2024-03-03 DIAGNOSIS — G8929 Other chronic pain: Secondary | ICD-10-CM

## 2024-03-03 MED ORDER — MELOXICAM 15 MG PO TABS
ORAL_TABLET | ORAL | 0 refills | Status: AC
  Filled 2024-03-03: qty 30, 30d supply, fill #0

## 2024-03-03 NOTE — Progress Notes (Unsigned)
 "  Office Visit Note   Patient: Katherine [name]           Date of Birth: 03-20-1975           MRN: [id number] Visit Date: 03/03/2024 Requested by: Petrina Pries, NP [address] [place],  [place] [address] PCP: Petrina Pries, NP  Subjective: Chief Complaint  Patient presents with   Left Shoulder - Pain, Follow-up    HPI: Katherine [name] is a 49 y.o. female who presents to the office reporting ***.                ROS: All systems reviewed are negative as they relate to the chief complaint within the history of present illness.  Patient denies fevers or chills.  Assessment & Plan: Visit Diagnoses:  1. Chronic left shoulder pain     Plan: ***  Follow-Up Instructions: No follow-ups on file.   Orders:  Orders Placed This Encounter  Procedures   MR Shoulder Left w/ contrast   Arthrogram   Meds ordered this encounter  Medications   meloxicam  (MOBIC ) 15 MG tablet    Sig: Take 1 tablet by mouth everday for 4 weeks    Dispense:  30 tablet    Refill:  0      Procedures: No procedures performed   Clinical Data: No additional findings.  Objective: Vital Signs: There were no vitals taken for this visit.  Physical Exam:  Constitutional: Patient appears well-developed HEENT:  Head: Normocephalic Eyes:EOM are normal Neck: Normal range of motion Cardiovascular: Normal rate Pulmonary/chest: Effort normal Neurologic: Patient is alert Skin: Skin is warm Psychiatric: Patient has normal mood and affect  Ortho Exam: ***  Specialty Comments:  No specialty comments available.  Imaging: No results found.   PMFS History: Patient Active Problem List   Diagnosis Date Noted   Class 2 severe obesity due to excess calories with serious comorbidity and body mass index (BMI) of 39.0 to 39.9 in adult 08/19/2023   Screening mammogram for breast cancer 08/19/2023   Family history of diabetes mellitus in father 08/19/2023   Eye pain, right 05/31/2023    Headache disorder 05/31/2023   Class 2 obesity with body mass index (BMI) of 38.0 to 38.9 in adult 05/31/2023   Bilateral thoracic back pain 01/23/2023   Screening for colon cancer 01/23/2023   Muscle spasm 01/23/2023   Establishing care with new doctor, encounter for 01/23/2023   Immunization due 01/23/2023   Hypertriglyceridemia 01/23/2023   Cervical polyp 01/05/2023   Menorrhagia, premenopausal 09/29/2022   Heart failure with preserved ejection fraction (HCC) 09/29/2022   Vaginal discharge 09/29/2022   Chest pain 03/23/2022   History of COVID-19 05/03/2019   Herpes zoster without complication 05/03/2019   Chronic bilateral low back pain without sciatica 05/03/2019   Ventral hernia without obstruction or gangrene    Spleen enlarged 07/31/2013   History of anemia 07/31/2013   Dental caries 09/06/2012   Past Medical History:  Diagnosis Date   Abdominal pain    Anemia    Depression    postpartum depression   Morbid obesity (HCC)    Splenomegaly     Family History  Problem Relation Age of Onset   Diabetes Mother    Kidney disease Brother    Colon cancer Neg Hx    Rectal cancer Neg Hx    Stomach cancer Neg Hx     Past Surgical History:  Procedure Laterality Date   nexplanon     ROBOTIC ASSISTED  LAPAROSCOPIC VENTRAL/INCISIONAL HERNIA REPAIR N/A 01/04/2018   Procedure: ROBOTIC ASSISTED LAPAROSCOPIC VENTRAL/INCISIONAL HERNIA REPAIR;  Surgeon: Jordis Laneta FALCON, MD;  Location: [facility];  Service: General;  Laterality: N/A;   ROBOTIC ASSISTED SALPINGO OOPHERECTOMY Bilateral 01/04/2018   Procedure: ROBOTIC ASSISTED SALPINGECTOMY;  Surgeon: Lake Read, MD;  Location: [facility];  Service: Gynecology;  Laterality: Bilateral;   TUBAL LIGATION     UPPER GASTROINTESTINAL ENDOSCOPY     UPPER GI ENDOSCOPY  2015   Social History   Occupational History   Not on file  Tobacco Use   Smoking status: Never    Passive exposure: Never   Smokeless tobacco: Never  Vaping Use    Vaping status: Never Used  Substance and Sexual Activity   Alcohol use: Yes    Comment: occ   Drug use: No   Sexual activity: Yes    Birth control/protection: Surgical    Comment: Tubal ligation        "

## 2024-03-10 ENCOUNTER — Ambulatory Visit: Admitting: Orthopedic Surgery

## 2024-03-24 ENCOUNTER — Other Ambulatory Visit

## 2024-04-07 ENCOUNTER — Ambulatory Visit: Admitting: Orthopedic Surgery

## 2024-08-24 ENCOUNTER — Encounter: Payer: Self-pay | Admitting: Family Medicine
